# Patient Record
Sex: Female | Born: 1965 | Race: White | Hispanic: No | Marital: Married | Smoking: Never smoker
Health system: Southern US, Community
[De-identification: ages and names within clinical notes are randomized; demographics above are authoritative.]

## PROBLEM LIST (undated history)

## (undated) DIAGNOSIS — R569 Unspecified convulsions: Secondary | ICD-10-CM

## (undated) DIAGNOSIS — E039 Hypothyroidism, unspecified: Secondary | ICD-10-CM

## (undated) DIAGNOSIS — R011 Cardiac murmur, unspecified: Secondary | ICD-10-CM

## (undated) DIAGNOSIS — Z973 Presence of spectacles and contact lenses: Secondary | ICD-10-CM

## (undated) DIAGNOSIS — I10 Essential (primary) hypertension: Secondary | ICD-10-CM

## (undated) DIAGNOSIS — Z923 Personal history of irradiation: Secondary | ICD-10-CM

## (undated) DIAGNOSIS — R002 Palpitations: Secondary | ICD-10-CM

## (undated) DIAGNOSIS — Z8489 Family history of other specified conditions: Secondary | ICD-10-CM

## (undated) DIAGNOSIS — T753XXA Motion sickness, initial encounter: Secondary | ICD-10-CM

## (undated) DIAGNOSIS — K219 Gastro-esophageal reflux disease without esophagitis: Secondary | ICD-10-CM

## (undated) DIAGNOSIS — F419 Anxiety disorder, unspecified: Secondary | ICD-10-CM

## (undated) DIAGNOSIS — N6019 Diffuse cystic mastopathy of unspecified breast: Secondary | ICD-10-CM

## (undated) HISTORY — DX: Essential (primary) hypertension: I10

## (undated) HISTORY — DX: Gastro-esophageal reflux disease without esophagitis: K21.9

## (undated) HISTORY — DX: Anxiety disorder, unspecified: F41.9

## (undated) HISTORY — DX: Diffuse cystic mastopathy of unspecified breast: N60.19

## (undated) HISTORY — DX: Unspecified convulsions: R56.9

## (undated) HISTORY — DX: Palpitations: R00.2

---

## 2004-05-28 ENCOUNTER — Ambulatory Visit: Payer: Self-pay | Admitting: Unknown Physician Specialty

## 2004-11-01 ENCOUNTER — Ambulatory Visit: Payer: Self-pay | Admitting: Urology

## 2005-04-26 ENCOUNTER — Ambulatory Visit: Payer: Self-pay | Admitting: Urology

## 2005-07-16 ENCOUNTER — Ambulatory Visit: Payer: Self-pay | Admitting: Unknown Physician Specialty

## 2005-07-26 ENCOUNTER — Ambulatory Visit: Payer: Self-pay | Admitting: Unknown Physician Specialty

## 2006-10-02 ENCOUNTER — Ambulatory Visit: Payer: Self-pay | Admitting: Cardiology

## 2006-10-15 ENCOUNTER — Encounter: Payer: Self-pay | Admitting: Cardiology

## 2006-10-15 ENCOUNTER — Ambulatory Visit: Payer: Self-pay | Admitting: Cardiology

## 2006-11-26 ENCOUNTER — Ambulatory Visit: Payer: Self-pay | Admitting: Unknown Physician Specialty

## 2007-10-12 ENCOUNTER — Ambulatory Visit: Payer: Self-pay

## 2008-04-14 ENCOUNTER — Ambulatory Visit: Payer: Self-pay | Admitting: Internal Medicine

## 2008-04-21 ENCOUNTER — Ambulatory Visit: Payer: Self-pay | Admitting: Unknown Physician Specialty

## 2008-10-27 ENCOUNTER — Ambulatory Visit: Payer: Self-pay | Admitting: Internal Medicine

## 2009-04-24 ENCOUNTER — Ambulatory Visit: Payer: Self-pay | Admitting: Unknown Physician Specialty

## 2009-05-01 ENCOUNTER — Ambulatory Visit: Payer: Self-pay | Admitting: Internal Medicine

## 2010-04-26 ENCOUNTER — Ambulatory Visit: Payer: Self-pay | Admitting: Unknown Physician Specialty

## 2010-06-17 HISTORY — PX: LITHOTRIPSY: SUR834

## 2010-11-02 ENCOUNTER — Ambulatory Visit (INDEPENDENT_AMBULATORY_CARE_PROVIDER_SITE_OTHER): Payer: BC Managed Care – PPO | Admitting: Internal Medicine

## 2010-11-02 ENCOUNTER — Encounter: Payer: Self-pay | Admitting: Internal Medicine

## 2010-11-02 DIAGNOSIS — I10 Essential (primary) hypertension: Secondary | ICD-10-CM

## 2010-11-02 DIAGNOSIS — R319 Hematuria, unspecified: Secondary | ICD-10-CM

## 2010-11-02 DIAGNOSIS — Z0000 Encounter for general adult medical examination without abnormal findings: Secondary | ICD-10-CM

## 2010-11-02 DIAGNOSIS — N390 Urinary tract infection, site not specified: Secondary | ICD-10-CM

## 2010-11-02 LAB — POCT URINALYSIS DIPSTICK
Bilirubin, UA: NEGATIVE
Glucose, UA: NEGATIVE
Ketones, UA: NEGATIVE
Leukocytes, UA: NEGATIVE
Nitrite, UA: NEGATIVE

## 2010-11-02 NOTE — Assessment & Plan Note (Signed)
[facility] OFFICE NOTE   Nichole, Gordon                        MRN:          [id number]  DATE:10/02/2006                            DOB:          [birth year]    I was asked by Dr. Lenord Fellers to evaluate Nichole Gordon, a delightful, 45-  year-old, married white female, mother of 3, for a racing heart and  palpitations.   She notices this particularly when she is anxious. She has a lot of  stress ongoing in her life at present including building a new home  (though she has sold her other house), raising 3 children, 2 of which  are teenagers, and staying busy as a general homemaker.   This racing heart sensation happens when she is uptight. She says that  it never occurs when she is exercising or really relaxed.   She denies any chest tightness, pressure, shortness of breath, or any  other ischemic symptoms or other cardiac symptoms with exertion.   She denies any presyncope or syncope.   Recent blood work by Dr. Jonny Ruiz showed a normal CBC, normal thyroid,  normal comprehensive metabolic panel and her lipid status showed a total  cholesterol of 222, HDL of 61, triglycerides 132, LDL 135 with a total  HDL ratio of 3.6. Her EKG was also normal with a normal PR, QRS, and  QTC.   PAST MEDICAL HISTORY:  She is intolerant of CONTRAST DYE, she is  intolerant of AMOXICILLIN, TRILEPTAL, DEPAKOTE and DILANTIN.   CURRENT MEDICATIONS:  1. Triphasil birth control pill daily.  2. Toprol XL 50 mg a day which was started for the racing heart which      has helped.  3. Lexapro 10 mg a day.  4. Flexeril 10 mg p.r.n. for back spasms.  5. Klonopin 0.25 one p.r.n.   The last 3 medicines were started by Dr. Lenord Fellers which have helped since  her visit.   PAST SURGICAL HISTORY:  None.   FAMILY HISTORY:  Negative for premature coronary disease.   REVIEW OF SYSTEMS:  Other than the HPI is positive for chronic fatigue.   SOCIAL  HISTORY:  As above.   PHYSICAL EXAMINATION:  VITAL SIGNS:  Blood pressure is 128/84, pulse is  84 and regular. She is 5 foot 4, weight is 159.  HEENT:  Slightly ruddy complexion. She is a little bit anxious. PERRLA.  Extraocular movements intact. Sclera clear. Facial symmetry is normal.  Dentition satisfactory. Carotids are full without bruits. There is no  thyromegaly. There is no JVD.  LUNGS:  Clear.  HEART:  Reveals a nondisplaced PMI. She has normal S1 and S2 without  murmurs, rubs or gallops.  ABDOMEN:  Soft with good bowel sounds. No midline bruit. There is no  hepatosplenomegaly.  EXTREMITIES:  No clubbing, cyanosis or edema. Pulses are brisk.  NEUROLOGIC:  Intact.  SKIN:  Intact.   ASSESSMENT:  1. Tachypalpitations which most likely are stress-induced sinus      tachycardia.  2. Anxiety.   PLAN:  2-D echo to rule out any structural heart  disease. If this is  normal, reassurance has been given.   I recommended therapeutic lifestyle changes for stress reduction  including regular exercise, regular sleep, eating properly, avoiding  caffeinated beverages or stimulants and making sure she has her punch  list done before she moves into her new house. It was a pleasure  meeting her.     Thomas C. Daleen Squibb, MD, [facility]  Electronically Signed    TCW/MedQ  DD: 10/02/2006  DT: 10/02/2006  Job #: [id number]   cc:   Nichole Gordon. Lenord Fellers, M.D.

## 2010-11-02 NOTE — Progress Notes (Signed)
  Subjective:    Patient ID: Nichole Gordon, female    DOB: [birth year], 45 y.o.   MRN: [id number]  HPI 45 year old W female for CPE and evaluation of intermittent LLQ abdominal pain since November. Saw Dr. Evelene Croon at [facility] urological in November and was dx with a kidney stone. Did not have CT then. Hx multiple kidney stones. Describes pain as burning and pinching lasting sometimes all day. Saw GYN early May and was told exam was OK. Then saw urologist and said no evidence of stone based on KUB and to revisit GYN. Pt has moderate non hemolyzed occult blood on dipstick today. Sent urine for microscopic and culture. Palpitations are stable on beta blocker. Had mammogram in November. Hx of fibrocystic breast disease. Hx of anxiety and occasionally takes Klonopin.    Review of Systems  Constitutional: Fatigue: Some fatigue.  Cardiovascular: Negative for chest pain and palpitations.  Genitourinary: Positive for hematuria (Saw blood in urine last week in April on one occasion) and flank pain. Negative for dysuria, vaginal bleeding, vaginal discharge, difficulty urinating and vaginal pain.  Psychiatric/Behavioral: Negative for behavioral problems, confusion and agitation. The patient is not nervous/anxious.        Objective:   Physical Exam  Constitutional: She is oriented to person, place, and time. She appears well-nourished. No distress.  HENT:  Head: Normocephalic and atraumatic.  Nose: Nose normal.  Mouth/Throat: Oropharynx is clear and moist.  Eyes: Conjunctivae and EOM are normal. Pupils are equal, round, and reactive to light. No scleral icterus.  Neck: Neck supple. No JVD present. No thyromegaly present.  Cardiovascular: Normal rate, regular rhythm and normal heart sounds.   No murmur heard. Pulmonary/Chest: Effort normal and breath sounds normal. She has no wheezes. She has no rales.  Abdominal: Soft. Bowel sounds are normal. She exhibits no distension and no mass. There is no tenderness.  There is no rebound and no guarding.  Genitourinary: Uterus normal.       No masses or tenderness on bimanual exam   Musculoskeletal: Normal range of motion. She exhibits no edema.  Lymphadenopathy:    She has no cervical adenopathy.  Neurological: She is alert and oriented to person, place, and time.  Skin: Skin is warm and dry. No rash noted.          Assessment & Plan:  Left lower Quadrant Abdominal pain with hx kidney stones. No recent CT done and pain is persistent. Schedule CT of pelvis/kidney Palpitations-stable on current regimen Anxiety-stable RTC after CT done

## 2010-11-03 LAB — CBC WITH DIFFERENTIAL/PLATELET
Basophils Absolute: 0 10*3/uL (ref 0.0–0.1)
Basophils Relative: 0 % (ref 0–1)
Eosinophils Absolute: 0.1 10*3/uL (ref 0.0–0.7)
Eosinophils Relative: 1 % (ref 0–5)
Lymphocytes Relative: 31 % (ref 12–46)
MCHC: 32.1 g/dL (ref 30.0–36.0)
MCV: 92.7 fL (ref 78.0–100.0)
Monocytes Absolute: 0.5 10*3/uL (ref 0.1–1.0)
Platelets: 263 10*3/uL (ref 150–400)
RDW: 12.9 % (ref 11.5–15.5)
WBC: 6.9 10*3/uL (ref 4.0–10.5)

## 2010-11-03 LAB — URINALYSIS, ROUTINE W REFLEX MICROSCOPIC
Bilirubin Urine: NEGATIVE
Ketones, ur: NEGATIVE mg/dL
Nitrite: NEGATIVE
Protein, ur: NEGATIVE mg/dL
Urobilinogen, UA: 1 mg/dL (ref 0.0–1.0)

## 2010-11-03 LAB — TSH: TSH: 0.339 u[IU]/mL — ABNORMAL LOW (ref 0.350–4.500)

## 2010-11-03 LAB — COMPREHENSIVE METABOLIC PANEL
ALT: 18 U/L (ref 0–35)
AST: 29 U/L (ref 0–37)
Alkaline Phosphatase: 85 U/L (ref 39–117)
BUN: 10 mg/dL (ref 6–23)
Chloride: 104 mEq/L (ref 96–112)
Creat: 0.5 mg/dL (ref 0.40–1.20)
Total Bilirubin: 0.6 mg/dL (ref 0.3–1.2)

## 2010-11-03 LAB — LIPID PANEL
HDL: 60 mg/dL (ref >39)
LDL Cholesterol: 127 mg/dL — ABNORMAL HIGH (ref 0–99)
Total CHOL/HDL Ratio: 3.4 Ratio
VLDL: 18 mg/dL (ref 0–40)

## 2010-11-03 LAB — VITAMIN D 25 HYDROXY (VIT D DEFICIENCY, FRACTURES): Vit D, 25-Hydroxy: 88 ng/mL (ref 30–89)

## 2010-11-05 ENCOUNTER — Ambulatory Visit
Admission: RE | Admit: 2010-11-05 | Discharge: 2010-11-05 | Disposition: A | Discharge status: Home / Self Care | Payer: BC Managed Care – PPO | Source: Ambulatory Visit | Attending: Internal Medicine | Admitting: Internal Medicine

## 2010-11-05 DIAGNOSIS — R319 Hematuria, unspecified: Secondary | ICD-10-CM

## 2010-11-06 LAB — URINE CULTURE: Colony Count: >=100000

## 2010-11-06 MED ORDER — LEVOFLOXACIN 500 MG PO TABS: 500.0000 mg | ORAL_TABLET | Freq: Every day | ORAL | Status: AC

## 2010-11-06 NOTE — Progress Notes (Signed)
Addended by: Chelsea Aus on: 11/06/2010 11:36 AM   Modules accepted: Orders

## 2010-11-09 ENCOUNTER — Encounter: Payer: Self-pay | Admitting: Internal Medicine

## 2010-11-09 ENCOUNTER — Ambulatory Visit (INDEPENDENT_AMBULATORY_CARE_PROVIDER_SITE_OTHER): Payer: BC Managed Care – PPO | Admitting: Internal Medicine

## 2010-11-09 DIAGNOSIS — F419 Anxiety disorder, unspecified: Secondary | ICD-10-CM

## 2010-11-09 DIAGNOSIS — F411 Generalized anxiety disorder: Secondary | ICD-10-CM

## 2010-11-09 DIAGNOSIS — N200 Calculus of kidney: Secondary | ICD-10-CM | POA: Insufficient documentation

## 2010-11-09 MED ORDER — CLONAZEPAM 0.5 MG PO TABS: 0.5000 mg | ORAL_TABLET | Freq: Two times a day (BID) | ORAL | Status: DC

## 2010-11-09 NOTE — Progress Notes (Signed)
  Subjective:    Patient ID: Nichole Gordon, female    DOB: [birth year], 45 y.o.   MRN: [id number]  HPI  For follow up of pelvic pain. Was found to have 2 organism UTI with E.coli and Enterococcus despite a normal U/A.  LLQ abd pain has improved. CT showed 2 stones in left kidney nonobstructing. Both organisms sensitive to Levaquin which pt should finish 10 day course. Labs reviewed Has LDL of 127. Rec diet and exercise.    Review of Systems     Objective:   Physical Exam No back pain or Abd pain       Assessment & Plan:  1- UTI 2-Kidney stones on left nonobstructing 3-Hyperlipidemia 4-Anxiety

## 2010-11-09 NOTE — Patient Instructions (Signed)
Finish Levaquin. Call if sxs return See in one year.

## 2010-11-21 ENCOUNTER — Other Ambulatory Visit: Payer: Self-pay | Admitting: Internal Medicine

## 2010-11-26 ENCOUNTER — Ambulatory Visit (INDEPENDENT_AMBULATORY_CARE_PROVIDER_SITE_OTHER): Payer: BC Managed Care – PPO | Admitting: Internal Medicine

## 2010-11-26 ENCOUNTER — Encounter: Payer: Self-pay | Admitting: Internal Medicine

## 2010-11-26 VITALS — Temp 98.7°F

## 2010-11-26 DIAGNOSIS — N390 Urinary tract infection, site not specified: Secondary | ICD-10-CM

## 2010-11-26 DIAGNOSIS — R300 Dysuria: Secondary | ICD-10-CM

## 2010-11-26 LAB — POCT URINALYSIS DIPSTICK
Ketones, UA: NEGATIVE
Protein, UA: NEGATIVE
Spec Grav, UA: 1.015
pH, UA: 7

## 2010-11-26 NOTE — Progress Notes (Signed)
  Subjective:    Patient ID: Nichole Gordon, female    DOB: [birth year], 45 y.o.   MRN: [id number]  HPI in today with recurrent complaint of left lower quadrant pain with some dysuria. At last visit was found to have 2 organism urinary tract infection with Escherichia coli and enterococci. Was treated with Levaquin 500 mg daily for 10 days. Symptoms improved at that time. Patient had CT to rule out kidney stone obstructing ureter and was found to have kidney stones in the kidney that were not obstructing. Today UA obtained along with culture. No nausea vomiting fever or chills    Review of Systems     Objective:   Physical Exam no CVA tenderness, no rebound tenderness and left lower quadrant.        Assessment & Plan:  Recurrent left lower quadrant pain-? UTI versus kidney stone pain. However patient has no hematuria. Recent CT unremarkable except for kidney stones in left kidney.   Plan is to treat her with Levaquin 500 mg daily for 7 days and have her return here in one week. If she has recurrent problems, she may need to revisit urologist.

## 2010-11-26 NOTE — Patient Instructions (Signed)
Take Levaquin 500 mg daily with food for 7 days. Return in one week.

## 2010-11-26 NOTE — Progress Notes (Signed)
Addended by: Daisy Blossom on: 11/26/2010 03:38 PM   Modules accepted: Level of Service

## 2010-11-28 LAB — URINE CULTURE: Colony Count: 40000

## 2010-12-04 ENCOUNTER — Ambulatory Visit (INDEPENDENT_AMBULATORY_CARE_PROVIDER_SITE_OTHER): Payer: BC Managed Care – PPO | Admitting: Internal Medicine

## 2010-12-04 DIAGNOSIS — N390 Urinary tract infection, site not specified: Secondary | ICD-10-CM

## 2010-12-04 LAB — POCT URINALYSIS DIPSTICK
Bilirubin, UA: NEGATIVE
Ketones, UA: NEGATIVE
Leukocytes, UA: NEGATIVE
pH, UA: 5

## 2010-12-04 NOTE — Progress Notes (Signed)
Pt came up for follow-up on UTI.  Urine dip WNL and pt reports no more signs or symptoms.  Instructed pt to call office with any other needs or concerns.

## 2010-12-17 ENCOUNTER — Other Ambulatory Visit: Payer: Self-pay | Admitting: Internal Medicine

## 2011-01-23 ENCOUNTER — Ambulatory Visit: Payer: Self-pay | Admitting: Urology

## 2011-01-24 ENCOUNTER — Ambulatory Visit: Payer: Self-pay | Admitting: Urology

## 2011-05-21 ENCOUNTER — Ambulatory Visit: Payer: Self-pay | Admitting: Unknown Physician Specialty

## 2011-09-23 ENCOUNTER — Other Ambulatory Visit: Payer: Self-pay

## 2011-09-23 MED ORDER — ESCITALOPRAM OXALATE 10 MG PO TABS: 10.0000 mg | ORAL_TABLET | Freq: Every day | ORAL | Status: DC

## 2011-11-07 ENCOUNTER — Other Ambulatory Visit: Payer: BC Managed Care – PPO | Admitting: Internal Medicine

## 2011-11-08 ENCOUNTER — Encounter: Payer: BC Managed Care – PPO | Admitting: Internal Medicine

## 2011-11-12 ENCOUNTER — Other Ambulatory Visit: Payer: BC Managed Care – PPO | Admitting: Internal Medicine

## 2011-11-12 DIAGNOSIS — Z0000 Encounter for general adult medical examination without abnormal findings: Secondary | ICD-10-CM

## 2011-11-12 LAB — LIPID PANEL
HDL: 49 mg/dL (ref >39)
Triglycerides: 76 mg/dL (ref <150)

## 2011-11-12 LAB — CBC WITH DIFFERENTIAL/PLATELET
HCT: 40.2 % (ref 36.0–46.0)
Hemoglobin: 13.4 g/dL (ref 12.0–15.0)
Lymphocytes Relative: 35 % (ref 12–46)
Lymphs Abs: 2.5 10*3/uL (ref 0.7–4.0)
Monocytes Absolute: 0.4 10*3/uL (ref 0.1–1.0)
Monocytes Relative: 6 % (ref 3–12)
Neutro Abs: 4 10*3/uL (ref 1.7–7.7)
WBC: 7 10*3/uL (ref 4.0–10.5)

## 2011-11-12 LAB — COMPREHENSIVE METABOLIC PANEL
Albumin: 4.6 g/dL (ref 3.5–5.2)
BUN: 11 mg/dL (ref 6–23)
Calcium: 9.3 mg/dL (ref 8.4–10.5)
Chloride: 105 mEq/L (ref 96–112)
Glucose, Bld: 88 mg/dL (ref 70–99)
Potassium: 4.5 mEq/L (ref 3.5–5.3)

## 2011-11-12 LAB — TSH: TSH: 0.059 u[IU]/mL — ABNORMAL LOW (ref 0.350–4.500)

## 2011-11-14 ENCOUNTER — Ambulatory Visit (INDEPENDENT_AMBULATORY_CARE_PROVIDER_SITE_OTHER): Payer: BC Managed Care – PPO | Admitting: Internal Medicine

## 2011-11-14 ENCOUNTER — Encounter: Payer: Self-pay | Admitting: Internal Medicine

## 2011-11-14 VITALS — BP 116/78 | HR 80 | Temp 99.1°F | Ht 63.75 in | Wt 150.0 lb

## 2011-11-14 DIAGNOSIS — F411 Generalized anxiety disorder: Secondary | ICD-10-CM

## 2011-11-14 DIAGNOSIS — I10 Essential (primary) hypertension: Secondary | ICD-10-CM

## 2011-11-14 DIAGNOSIS — N6019 Diffuse cystic mastopathy of unspecified breast: Secondary | ICD-10-CM

## 2011-11-14 DIAGNOSIS — F419 Anxiety disorder, unspecified: Secondary | ICD-10-CM

## 2011-11-14 DIAGNOSIS — Z0000 Encounter for general adult medical examination without abnormal findings: Secondary | ICD-10-CM

## 2011-11-14 DIAGNOSIS — R7989 Other specified abnormal findings of blood chemistry: Secondary | ICD-10-CM

## 2011-11-14 DIAGNOSIS — Z87442 Personal history of urinary calculi: Secondary | ICD-10-CM

## 2011-11-14 LAB — POCT URINALYSIS DIPSTICK
Glucose, UA: NEGATIVE
Ketones, UA: NEGATIVE
Leukocytes, UA: NEGATIVE
Spec Grav, UA: 1.025
Urobilinogen, UA: NEGATIVE

## 2011-11-17 NOTE — Patient Instructions (Addendum)
Continue Toprol for hypertension Lexapro and occasional Klonopin. Consider starting Synthroid for subnormal TSH

## 2011-11-19 ENCOUNTER — Other Ambulatory Visit: Payer: BC Managed Care – PPO | Admitting: Internal Medicine

## 2011-11-21 ENCOUNTER — Encounter: Payer: BC Managed Care – PPO | Admitting: Internal Medicine

## 2011-12-15 ENCOUNTER — Encounter: Payer: Self-pay | Admitting: Internal Medicine

## 2011-12-15 DIAGNOSIS — F419 Anxiety disorder, unspecified: Secondary | ICD-10-CM | POA: Insufficient documentation

## 2011-12-15 DIAGNOSIS — I10 Essential (primary) hypertension: Secondary | ICD-10-CM | POA: Insufficient documentation

## 2011-12-15 DIAGNOSIS — N6019 Diffuse cystic mastopathy of unspecified breast: Secondary | ICD-10-CM | POA: Insufficient documentation

## 2011-12-15 DIAGNOSIS — R7989 Other specified abnormal findings of blood chemistry: Secondary | ICD-10-CM | POA: Insufficient documentation

## 2011-12-15 NOTE — Progress Notes (Signed)
  Subjective:    Patient ID: Nichole Gordon, female    DOB: Mar 06, 1966, 46 y.o.   MRN: [id number]  HPI 46 year old white female with history of hypertension and kidney stones in today for health maintenance and evaluation of medical problems. History of anxiety for which she takes Lexapro. Was placed on that in 2008. Occasionally takes Klonopin for anxiety. Is on Toprol-XL 50 mg daily for hypertension and blood pressures under good control. History of mild hyperlipidemia and fibrocystic breast disease. Patient says she had lithotripsy for left kidney stones August 2012. Also in 2012 had 2 organism urinary tract infection. Urologist is in [place] where she resides.    Recent lab work shows an LDL cholesterol of 120, total cholesterol 184, HDL cholesterol 49, triglycerides 76. Vitamin D level is good at 83. CBC is within normal limits.  Of note over the past year she has had 2 TSH values that are subnormal.  Patient had tetanus immunization May 2010.  Family history: Mother with history of uterine cancer, patient has a son with high functioning call to his him, one brother with hypertension, father with history of stroke and alcoholism. Mother with history of mental issues and hypertension. One sister in good health.  Patient is married has 2 sons and a daughter. 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 Does not smoke or consume alcohol. Does try to exercise 3 times weekly.      Review of Systems  Constitutional: Negative.   All other systems reviewed and are negative.       Objective:   Physical Exam  Nursing note and vitals reviewed. Constitutional: She is oriented to person, place, and time. She appears well-developed and well-nourished. No distress.  HENT:  Head: Normocephalic and atraumatic.  Right Ear: External ear normal.  Left Ear: External ear normal.  Mouth/Throat: Oropharynx is clear and moist.  Eyes: Conjunctivae and EOM are normal. Pupils are equal, round, and  reactive to light. Right eye exhibits no discharge. Left eye exhibits no discharge. No scleral icterus.  Neck: Neck supple. No JVD present. No thyromegaly present.  Cardiovascular: Normal rate, regular rhythm, normal heart sounds and intact distal pulses.   No murmur heard. Pulmonary/Chest: Effort normal and breath sounds normal. She has no wheezes. She has no rales.       Breast normal female  Abdominal: Soft. Bowel sounds are normal.  Genitourinary:       Deferred  Musculoskeletal: She exhibits no edema.  Lymphadenopathy:    She has no cervical adenopathy.  Neurological: She is alert and oriented to person, place, and time. She has normal reflexes. She displays normal reflexes. No cranial nerve deficit. She exhibits normal muscle tone. Coordination normal.  Skin: Skin is warm and dry. No rash noted. She is not diaphoretic.  Psychiatric: She has a normal mood and affect. Her behavior is normal. Judgment and thought content normal.          Assessment & Plan:  Anxiety-treated with occasional Klonopin and Lexapro on a daily basis  Hyperlipidemia with elevated LDL-continue diet and exercise  Fibrocystic breast disease  Hypertension-well-controlled on Toprol  History of kidney stones  Subnormal TSH-consider starting Synthroid 0.025 mg daily with followup in 3 months.  Health maintenance: Patient needs annual mammogram.

## 2012-01-17 ENCOUNTER — Other Ambulatory Visit: Payer: Self-pay | Admitting: Internal Medicine

## 2012-02-17 ENCOUNTER — Telehealth: Payer: Self-pay | Admitting: Internal Medicine

## 2012-02-17 NOTE — Telephone Encounter (Signed)
Attended clear 01 patient is on thyroid replacement therapy. She has had 2 TSH results in the past year that her low. Patient is being treated for presumed hypothyroidism by GYN physician. I do not have any notes from GYN physician. Apparently is on Synthroid 0.025 mg daily but recent TSH was 0.0591 year ago was 0.339. We'll records indicate patient had normal TSH in 2002. Had normal cranial MR in 2001. This was done because of question of seizure disorder. Later was thought to be anxiety related. Records indicate that in 2008 at [facility] the bowel or cardiology review of labs showed a normal thyroid test by Dr. Jonny Ruiz. TSH was normal here in 2009 in 2010. We need to note that she is on thyroid replacement therapy per GYN although it would seem that she might be over replaced

## 2012-05-27 ENCOUNTER — Ambulatory Visit: Payer: Self-pay | Admitting: Nurse Practitioner

## 2012-11-02 LAB — HM PAP SMEAR: HM Pap smear: NEGATIVE

## 2013-01-26 ENCOUNTER — Other Ambulatory Visit: Payer: Self-pay | Admitting: Internal Medicine

## 2013-01-28 ENCOUNTER — Encounter: Payer: BC Managed Care – PPO | Admitting: Internal Medicine

## 2013-02-12 ENCOUNTER — Other Ambulatory Visit: Payer: Self-pay | Admitting: Internal Medicine

## 2013-02-12 ENCOUNTER — Other Ambulatory Visit: Payer: Self-pay

## 2013-02-23 ENCOUNTER — Other Ambulatory Visit: Payer: BC Managed Care – PPO | Admitting: Internal Medicine

## 2013-02-23 DIAGNOSIS — Z13220 Encounter for screening for lipoid disorders: Secondary | ICD-10-CM

## 2013-02-23 DIAGNOSIS — I10 Essential (primary) hypertension: Secondary | ICD-10-CM

## 2013-02-23 DIAGNOSIS — Z130 Encounter for screening for diseases of the blood and blood-forming organs and certain disorders involving the immune mechanism: Secondary | ICD-10-CM

## 2013-02-23 DIAGNOSIS — E039 Hypothyroidism, unspecified: Secondary | ICD-10-CM

## 2013-02-23 LAB — COMPREHENSIVE METABOLIC PANEL
ALT: 10 U/L (ref 0–35)
AST: 18 U/L (ref 0–37)
Alkaline Phosphatase: 72 U/L (ref 39–117)
Sodium: 136 mEq/L (ref 135–145)
Total Bilirubin: 0.7 mg/dL (ref 0.3–1.2)
Total Protein: 6.6 g/dL (ref 6.0–8.3)

## 2013-02-23 LAB — LIPID PANEL
LDL Cholesterol: 113 mg/dL — ABNORMAL HIGH (ref 0–99)
VLDL: 19 mg/dL (ref 0–40)

## 2013-02-23 LAB — CBC WITH DIFFERENTIAL/PLATELET
Basophils Relative: 0 % (ref 0–1)
Eosinophils Absolute: 0.1 10*3/uL (ref 0.0–0.7)
Lymphs Abs: 2.6 10*3/uL (ref 0.7–4.0)
MCH: 30.2 pg (ref 26.0–34.0)
MCHC: 34.1 g/dL (ref 30.0–36.0)
Neutrophils Relative %: 58 % (ref 43–77)
Platelets: 236 10*3/uL (ref 150–400)
RBC: 4.67 MIL/uL (ref 3.87–5.11)

## 2013-02-24 LAB — VITAMIN D 25 HYDROXY (VIT D DEFICIENCY, FRACTURES): Vit D, 25-Hydroxy: 67 ng/mL (ref 30–89)

## 2013-02-26 ENCOUNTER — Other Ambulatory Visit: Payer: Self-pay | Admitting: Internal Medicine

## 2013-03-01 ENCOUNTER — Encounter: Payer: Self-pay | Admitting: Internal Medicine

## 2013-03-01 ENCOUNTER — Ambulatory Visit (INDEPENDENT_AMBULATORY_CARE_PROVIDER_SITE_OTHER): Payer: BC Managed Care – PPO | Admitting: Internal Medicine

## 2013-03-01 VITALS — BP 136/84 | HR 88 | Temp 99.1°F | Ht 63.25 in | Wt 145.5 lb

## 2013-03-01 DIAGNOSIS — Z0000 Encounter for general adult medical examination without abnormal findings: Secondary | ICD-10-CM

## 2013-03-01 DIAGNOSIS — I10 Essential (primary) hypertension: Secondary | ICD-10-CM

## 2013-03-01 DIAGNOSIS — E7800 Pure hypercholesterolemia, unspecified: Secondary | ICD-10-CM

## 2013-03-01 DIAGNOSIS — F411 Generalized anxiety disorder: Secondary | ICD-10-CM

## 2013-03-01 DIAGNOSIS — R6889 Other general symptoms and signs: Secondary | ICD-10-CM

## 2013-03-01 DIAGNOSIS — R7989 Other specified abnormal findings of blood chemistry: Secondary | ICD-10-CM

## 2013-03-01 LAB — POCT URINALYSIS DIPSTICK
Leukocytes, UA: NEGATIVE
Nitrite, UA: NEGATIVE
Protein, UA: NEGATIVE
pH, UA: 6.5

## 2013-03-02 ENCOUNTER — Other Ambulatory Visit: Payer: Self-pay | Admitting: Internal Medicine

## 2013-03-23 ENCOUNTER — Other Ambulatory Visit: Payer: Self-pay | Admitting: Internal Medicine

## 2013-03-26 NOTE — Progress Notes (Signed)
  Subjective:    Patient ID: Nichole Gordon, female    DOB: [birth year], 47 y.o.   MRN: [id number]  HPI 47 year old White female with history of hypertension and kidney stones in today for health maintenance and evaluation of medical issues. History of anxiety for which she takes Lexapro and occasionally takes Klonopin. She was placed on medication for anxiety in 2008. Is on Toprol-XL 50 mg daily for hypertension. Blood pressure has been under good control. History of mild hyperlipidemia. History of fibrocystic breast disease. Patient says she had lithotripsy for left kidney stones August 2012. Also at that time she had a to organism urinary tract infection. Urologist is in [place] where she resides.  History of to TSH values that have been some normal in the past.  Tetanus immunization in May 2010.  Family history: Mother with history of uterine cancer. Patient has a son with high functioning autism. One brother with hypertension. Father with history of stroke and alcoholism. Mother with history of mental illness and hypertension. One sister in good health.  Social history: Patient is married, has 2 sons and a daughter. 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 Does not smoke or consume alcohol. Does try to exercise several times weekly.    Review of Systems  Constitutional: Negative.   All other systems reviewed and are negative.       Objective:   Physical Exam  Vitals reviewed. Constitutional: She is oriented to person, place, and time. She appears well-developed and well-nourished. No distress.  HENT:  Head: Normocephalic and atraumatic.  Left Ear: External ear normal.  Mouth/Throat: Oropharynx is clear and moist. No oropharyngeal exudate.  Eyes: Conjunctivae and EOM are normal. Pupils are equal, round, and reactive to light. Right eye exhibits no discharge. Left eye exhibits no discharge. No scleral icterus.  Neck: Neck supple. No JVD present. No thyromegaly present.   Cardiovascular: Normal rate, regular rhythm and normal heart sounds.   No murmur heard. Pulmonary/Chest: Effort normal and breath sounds normal. No respiratory distress. She has no rales. She exhibits no tenderness.  Breasts normal female  Abdominal: Soft. Bowel sounds are normal. She exhibits no distension and no mass. There is no tenderness. There is no rebound and no guarding.  Genitourinary:  deferred  Musculoskeletal: She exhibits no edema.  Lymphadenopathy:    She has no cervical adenopathy.  Neurological: She is alert and oriented to person, place, and time. Coordination normal.  Skin: Skin is warm and dry. No rash noted. She is not diaphoretic.  Psychiatric: She has a normal mood and affect. Her behavior is normal. Judgment and thought content normal.          Assessment & Plan:  Hypertension-well-controlled on Toprol 25 mg daily  Hyperlipidemia-history of elevated LDL continue diet and exercise  Anxiety treated with occasional Klonopin and daily Lexapro.  History of fibrocystic breast disease  History of kidney stones  History of low TSH. She is being treated by gynecologist with Synthroid and also Cytomel. Have asked her to speak with her gynecologist about persistently low TSH. It seems that she is over replaced with thyroid replacement  and may not need it at all.  Add: 10/10 14 Colonoscopy done  at [facility] 02/28/13 showed hyperplastic polys and sessile polyp. They recommend repeat study in 5 years.

## 2013-07-30 ENCOUNTER — Ambulatory Visit: Payer: Self-pay | Admitting: Nurse Practitioner

## 2013-08-01 NOTE — Patient Instructions (Signed)
Ask her to speak with gynecologist about persistently abnormal TSH on thyroid replacement therapy. Return in one year. Watch diet and exercise.

## 2014-02-18 ENCOUNTER — Encounter: Payer: Self-pay | Admitting: Podiatry

## 2014-02-18 ENCOUNTER — Ambulatory Visit (INDEPENDENT_AMBULATORY_CARE_PROVIDER_SITE_OTHER): Payer: BC Managed Care – PPO | Admitting: Podiatry

## 2014-02-18 ENCOUNTER — Ambulatory Visit (INDEPENDENT_AMBULATORY_CARE_PROVIDER_SITE_OTHER): Payer: BC Managed Care – PPO

## 2014-02-18 VITALS — Ht 63.5 in | Wt 147.0 lb

## 2014-02-18 DIAGNOSIS — G5762 Lesion of plantar nerve, left lower limb: Secondary | ICD-10-CM

## 2014-02-18 DIAGNOSIS — M7750 Other enthesopathy of unspecified foot: Secondary | ICD-10-CM

## 2014-02-18 DIAGNOSIS — M722 Plantar fascial fibromatosis: Secondary | ICD-10-CM

## 2014-02-18 DIAGNOSIS — G5760 Lesion of plantar nerve, unspecified lower limb: Secondary | ICD-10-CM

## 2014-02-18 MED ORDER — TRIAMCINOLONE ACETONIDE 10 MG/ML IJ SUSP: 10.0000 mg | Freq: Once | INTRAMUSCULAR | Status: DC

## 2014-02-18 NOTE — Progress Notes (Signed)
Subjective:     Patient ID: Nichole Gordon, female   DOB: 22-Aug-1965, 48 y.o.   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 Injected the base of the fifth metatarsal 3 mg Kenalog 5 mg Xylocaine and discussed surgical intervention for the neuroma given his long-term history and failure to respond to conservative care. Reappoint to recheck

## 2014-02-18 NOTE — Progress Notes (Signed)
   Subjective:    Patient ID: Nichole Gordon, female    DOB: 1966-02-08, 48 y.o.   MRN: [id number]  HPI Comments: 2 weeks ago stepping off a curb i heard a pop in the foot, the foot was fine at the time , but it seems to have got worse , left foot lateral side of foot   Foot Pain      Review of Systems  All other systems reviewed and are negative.      Objective:   Physical Exam        Assessment & Plan:

## 2014-04-15 ENCOUNTER — Other Ambulatory Visit: Payer: Self-pay | Admitting: Internal Medicine

## 2014-10-03 ENCOUNTER — Ambulatory Visit: Admit: 2014-10-03 | Disposition: A | Discharge status: Home / Self Care | Payer: Self-pay | Admitting: Family Medicine

## 2014-10-06 ENCOUNTER — Ambulatory Visit
Admit: 2014-10-06 | Disposition: A | Discharge status: Home / Self Care | Payer: Self-pay | Attending: Family Medicine | Admitting: Family Medicine

## 2014-10-06 LAB — HM MAMMOGRAPHY

## 2014-11-16 ENCOUNTER — Ambulatory Visit: Payer: BLUE CROSS/BLUE SHIELD | Admitting: Family Medicine

## 2014-11-18 ENCOUNTER — Other Ambulatory Visit: Payer: Self-pay | Admitting: Family Medicine

## 2014-11-18 DIAGNOSIS — I10 Essential (primary) hypertension: Secondary | ICD-10-CM

## 2014-11-18 DIAGNOSIS — E039 Hypothyroidism, unspecified: Secondary | ICD-10-CM

## 2014-11-21 ENCOUNTER — Other Ambulatory Visit: Payer: Self-pay

## 2014-11-21 ENCOUNTER — Other Ambulatory Visit: Payer: Self-pay | Admitting: Family Medicine

## 2014-11-21 ENCOUNTER — Ambulatory Visit (INDEPENDENT_AMBULATORY_CARE_PROVIDER_SITE_OTHER): Payer: BLUE CROSS/BLUE SHIELD | Admitting: Family Medicine

## 2014-11-21 ENCOUNTER — Encounter (INDEPENDENT_AMBULATORY_CARE_PROVIDER_SITE_OTHER): Payer: Self-pay

## 2014-11-21 ENCOUNTER — Encounter: Payer: Self-pay | Admitting: Family Medicine

## 2014-11-21 VITALS — BP 134/82 | HR 83 | Temp 98.6°F | Ht 64.5 in | Wt 160.6 lb

## 2014-11-21 DIAGNOSIS — E039 Hypothyroidism, unspecified: Secondary | ICD-10-CM

## 2014-11-21 DIAGNOSIS — I10 Essential (primary) hypertension: Secondary | ICD-10-CM | POA: Diagnosis not present

## 2014-11-21 DIAGNOSIS — F419 Anxiety disorder, unspecified: Secondary | ICD-10-CM | POA: Diagnosis not present

## 2014-11-21 DIAGNOSIS — Z0000 Encounter for general adult medical examination without abnormal findings: Secondary | ICD-10-CM

## 2014-11-21 MED ORDER — ESCITALOPRAM OXALATE 20 MG PO TABS: 20.0000 mg | ORAL_TABLET | Freq: Every day | ORAL | Status: DC

## 2014-11-21 NOTE — Progress Notes (Signed)
BP 134/82 mmHg  Pulse 83  Temp(Src) 98.6 F (37 C)  Ht 5' 4.5" (1.638 m)  Wt 160 lb 9.6 oz (72.848 kg)  BMI 27.15 kg/m2  SpO2 98%  LMP 11/12/2014 (Exact Date)   Subjective:    Patient ID: Nichole Gordon, female    DOB: 1966-01-02, 49 y.o.   MRN: [id number]  HPI: Nichole Gordon is a 49 y.o. female presenting on 11/21/2014 for Hypothyroidism and Anxiety Nichole Gordon is doing well. She is anxious as her daughter is going off to college and she is very anxious about it, so Nichole Gordon is worried about her. She is otherwise feeling well with no other concerns or complaints at this time.   ANXIETY/STRESS Duration:stable Anxious mood: yes  Excessive worrying: no Irritability: no  Sweating: no Nausea: no Palpitations:no Hyperventilation: no Panic attacks: no Agoraphobia: no  Obscessions/compulsions: no Depressed mood: no Anhedonia: no Weight changes: yes Insomnia: no   Hypersomnia: no Fatigue/loss of energy: no Feelings of worthlessness: no Feelings of guilt: no Impaired concentration/indecisiveness: no Suicidal ideations: no  Crying spells: no Recent Stressors/Life Changes: yes   Relationship problems: no   Family stress: yes     Financial stress: no    Job stress: no    Recent death/loss: no  HYPERTENSION Hypertension status: controlled Satisfied with current treatment? yes Duration of hypertension: chronic BP monitoring frequency:  rarely Medication compliance: Not currently on anything Aspirin: no Recurrent headaches: no Visual changes: no Palpitations: no Dyspnea: no Chest pain: no Lower extremity edema: no Dizzy/lightheaded: no   HYPOTHYROIDISM Thyroid control status:controlled Satisfied with current treatment? yes Medication side effects: no Medication compliance: excellent compliance Recent dose adjustment:no Fatigue: no Cold intolerance: no Heat intolerance: no Weight gain: yes Weight loss: no Constipation: no Diarrhea/loose stools:  no Palpitations: no Lower extremity edema: no Anxiety/depressed mood: yes  Relevant past medical, surgical, family and social history reviewed and updated as indicated. Interim medical history since our last visit reviewed. Allergies and medications reviewed and updated.  Current Outpatient Prescriptions on File Prior to Visit  Medication Sig  . Cholecalciferol (VITAMIN D) 2000 UNITS CAPS Take by mouth.    . DYMISTA 137-50 MCG/ACT SUSP   . escitalopram (LEXAPRO) 10 MG tablet TAKE 1 TABLET EVERY DAY (Patient not taking: Reported on 11/21/2014)  . escitalopram (LEXAPRO) 10 MG tablet TAKE 1 TABLET EVERY DAY (Patient not taking: Reported on 11/21/2014)  . levothyroxine (SYNTHROID, LEVOTHROID) 25 MCG tablet Take 25 mcg by mouth daily.  Marland Kitchen liothyronine (CYTOMEL) 5 MCG tablet   . Multiple Vitamin (MULTIVITAMIN) capsule Take 1 capsule by mouth daily.     Current Facility-Administered Medications on File Prior to Visit  Medication  . triamcinolone acetonide (KENALOG) 10 MG/ML injection 10 mg    Review of Systems  Constitutional: Negative.   HENT: Negative.   Respiratory: Negative.   Cardiovascular: Negative.   Musculoskeletal: Negative.   Skin: Negative.   Psychiatric/Behavioral: Negative.     Per HPI unless specifically indicated above     Objective:    BP 134/82 mmHg  Pulse 83  Temp(Src) 98.6 F (37 C)  Ht 5' 4.5" (1.638 m)  Wt 160 lb 9.6 oz (72.848 kg)  BMI 27.15 kg/m2  SpO2 98%  LMP 11/12/2014 (Exact Date)  Wt Readings from Last 3 Encounters:  11/21/14 160 lb 9.6 oz (72.848 kg)  02/18/14 147 lb (66.679 kg)  03/01/13 145 lb 8 oz (65.998 kg)    Physical Exam  Constitutional: She is oriented to person,  place, and time. She appears well-developed and well-nourished.  Eyes: Conjunctivae and EOM are normal. Pupils are equal, round, and reactive to light.  Neck: Normal range of motion. Neck supple. No thyromegaly present.  Cardiovascular: Normal rate, regular rhythm and normal  heart sounds.   Pulmonary/Chest: Effort normal and breath sounds normal.  Lymphadenopathy:    She has no cervical adenopathy.  Neurological: She is alert and oriented to person, place, and time. She has normal reflexes.  Skin: Skin is warm and dry.  Psychiatric: She has a normal mood and affect. Her behavior is normal. Judgment and thought content normal.       Assessment & Plan:   Problem List Items Addressed This Visit    Hypertension    Under good control today. Continue to monitor. Will check microalbumin and BMP today. If spilling protein consider low dose ACE. Call if BP starting to raise again.       Anxiety    Slightly exacerbated due to her daughter going to be starting college and moving away. Does not feel like she needs to increase her medication. Feeling well. Refill given today. Call with any concerns or complaints.       Relevant Medications   escitalopram (LEXAPRO) 20 MG tablet   Hypothyroidism - Primary (Chronic)   Relevant Orders   TSH    Other Visit Diagnoses    Benign hypertension        Relevant Orders    Basic metabolic panel    Microalbumin / creatinine urine ratio       Due for her physical next visit. No need for Pap. Normal with negative HPV in 2015.   Follow up plan: Return in about 6 months (around 05/23/2015) for PE.

## 2014-11-21 NOTE — Patient Instructions (Signed)
Generalized Anxiety Disorder Generalized anxiety disorder (GAD) is a mental disorder. It interferes with life functions, including relationships, work, and school. GAD is different from normal anxiety, which everyone experiences at some point in their lives in response to specific life events and activities. Normal anxiety actually helps us prepare for and get through these life events and activities. Normal anxiety goes away after the event or activity is over.  GAD causes anxiety that is not necessarily related to specific events or activities. It also causes excess anxiety in proportion to specific events or activities. The anxiety associated with GAD is also difficult to control. GAD can vary from mild to severe. People with severe GAD can have intense waves of anxiety with physical symptoms (panic attacks).  SYMPTOMS The anxiety and worry associated with GAD are difficult to control. This anxiety and worry are related to many life events and activities and also occur more days than not for 6 months or longer. People with GAD also have three or more of the following symptoms (one or more in children):  Restlessness.   Fatigue.  Difficulty concentrating.   Irritability.  Muscle tension.  Difficulty sleeping or unsatisfying sleep. DIAGNOSIS GAD is diagnosed through an assessment by your health care provider. Your health care provider will ask you questions aboutyour mood,physical symptoms, and events in your life. Your health care provider may ask you about your medical history and use of alcohol or drugs, including prescription medicines. Your health care provider may also do a physical exam and blood tests. Certain medical conditions and the use of certain substances can cause symptoms similar to those associated with GAD. Your health care provider may refer you to a mental health specialist for further evaluation. TREATMENT The following therapies are usually used to treat GAD:    Medication. Antidepressant medication usually is prescribed for long-term daily control. Antianxiety medicines may be added in severe cases, especially when panic attacks occur.   Talk therapy (psychotherapy). Certain types of talk therapy can be helpful in treating GAD by providing support, education, and guidance. A form of talk therapy called cognitive behavioral therapy can teach you healthy ways to think about and react to daily life events and activities.  Stress managementtechniques. These include yoga, meditation, and exercise and can be very helpful when they are practiced regularly. A mental health specialist can help determine which treatment is best for you. Some people see improvement with one therapy. However, other people require a combination of therapies. Document Released: 09/28/2012 Document Revised: 10/18/2013 Document Reviewed: 09/28/2012 [employer] Patient Information 2015 [employer]. This information is not intended to replace advice given to you by your health care provider. Make sure you discuss any questions you have with your health care provider.  

## 2014-11-21 NOTE — Assessment & Plan Note (Signed)
Slightly exacerbated due to her daughter going to be starting college and moving away. Does not feel like she needs to increase her medication. Feeling well. Refill given today. Call with any concerns or complaints.

## 2014-11-21 NOTE — Assessment & Plan Note (Signed)
Under good control today. Continue to monitor. Will check microalbumin and BMP today. If spilling protein consider low dose ACE. Call if BP starting to raise again.

## 2014-11-22 LAB — BASIC METABOLIC PANEL
BUN/Creatinine Ratio: 27 — ABNORMAL HIGH (ref 9–23)
BUN: 13 mg/dL (ref 6–24)
CO2: 26 mmol/L (ref 18–29)
Calcium: 9.1 mg/dL (ref 8.7–10.2)
Chloride: 102 mmol/L (ref 97–108)
Creatinine, Ser: 0.49 mg/dL — ABNORMAL LOW (ref 0.57–1.00)
GFR calc Af Amer: 130 mL/min/{1.73_m2} (ref >59)
GFR calc non Af Amer: 113 mL/min/{1.73_m2} (ref >59)
Glucose: 87 mg/dL (ref 65–99)
Potassium: 4.3 mmol/L (ref 3.5–5.2)
SODIUM: 142 mmol/L (ref 134–144)

## 2014-11-22 LAB — TSH: TSH: 0.849 u[IU]/mL (ref 0.450–4.500)

## 2014-11-22 MED ORDER — LIOTHYRONINE SODIUM 5 MCG PO TABS: 5.0000 ug | ORAL_TABLET | Freq: Every day | ORAL | Status: DC

## 2014-11-22 MED ORDER — LEVOTHYROXINE SODIUM 25 MCG PO TABS: 25.0000 ug | ORAL_TABLET | Freq: Every day | ORAL | Status: DC

## 2014-11-22 NOTE — Assessment & Plan Note (Signed)
TSH checked today. Await results. Adjust medication as needed. Continue to monitor.   Addendum 11/22/14: TSH came back slightly low, but within normal limits. Will continue current dose of medications. Sent to her pharmacy.

## 2014-11-22 NOTE — Addendum Note (Signed)
Addended by: Valerie Roys on: 11/22/2014 08:27 AM   Modules accepted: Orders

## 2014-12-08 LAB — MICROALBUMIN, URINE WAIVED
Creatinine, Urine Waived: 200 mg/dL (ref 10–300)
Microalb, Ur Waived: 30 mg/L — ABNORMAL HIGH (ref 0–19)

## 2015-05-23 ENCOUNTER — Encounter: Payer: Self-pay | Admitting: Family Medicine

## 2015-05-23 ENCOUNTER — Ambulatory Visit (INDEPENDENT_AMBULATORY_CARE_PROVIDER_SITE_OTHER): Payer: 59 | Admitting: Family Medicine

## 2015-05-23 VITALS — BP 136/81 | HR 91 | Temp 99.3°F | Ht 63.0 in | Wt 156.0 lb

## 2015-05-23 DIAGNOSIS — Z0000 Encounter for general adult medical examination without abnormal findings: Secondary | ICD-10-CM

## 2015-05-23 DIAGNOSIS — E039 Hypothyroidism, unspecified: Secondary | ICD-10-CM | POA: Diagnosis not present

## 2015-05-23 DIAGNOSIS — F419 Anxiety disorder, unspecified: Secondary | ICD-10-CM | POA: Diagnosis not present

## 2015-05-23 DIAGNOSIS — I10 Essential (primary) hypertension: Secondary | ICD-10-CM | POA: Diagnosis not present

## 2015-05-23 MED ORDER — ESCITALOPRAM OXALATE 20 MG PO TABS: 20.0000 mg | ORAL_TABLET | Freq: Every day | ORAL | Status: DC

## 2015-05-23 NOTE — Patient Instructions (Signed)
Preventive Care for Adults, Female A healthy lifestyle and preventive care can promote health and wellness. Preventive health guidelines for women include the following key practices.  A routine yearly physical is a good way to check with your health care provider about your health and preventive screening. It is a chance to share any concerns and updates on your health and to receive a thorough exam.  Visit your dentist for a routine exam and preventive care every 6 months. Brush your teeth twice a day and floss once a day. Good oral hygiene prevents tooth decay and gum disease.  The frequency of eye exams is based on your age, health, family medical history, use of contact lenses, and other factors. Follow your health care provider's recommendations for frequency of eye exams.  Eat a healthy diet. Foods like vegetables, fruits, whole grains, low-fat dairy products, and lean protein foods contain the nutrients you need without too many calories. Decrease your intake of foods high in solid fats, added sugars, and salt. Eat the right amount of calories for you.Get information about a proper diet from your health care provider, if necessary.  Regular physical exercise is one of the most important things you can do for your health. Most adults should get at least 150 minutes of moderate-intensity exercise (any activity that increases your heart rate and causes you to sweat) each week. In addition, most adults need muscle-strengthening exercises on 2 or more days a week.  Maintain a healthy weight. The body mass index (BMI) is a screening tool to identify possible weight problems. It provides an estimate of body fat based on height and weight. Your health care provider can find your BMI and can help you achieve or maintain a healthy weight.For adults 20 years and older:  A BMI below 18.5 is considered underweight.  A BMI of 18.5 to 24.9 is normal.  A BMI of 25 to 29.9 is considered overweight.  A  BMI of 30 and above is considered obese.  Maintain normal blood lipids and cholesterol levels by exercising and minimizing your intake of saturated fat. Eat a balanced diet with plenty of fruit and vegetables. Blood tests for lipids and cholesterol should begin at age 45 and be repeated every 5 years. If your lipid or cholesterol levels are high, you are over 50, or you are at high risk for heart disease, you may need your cholesterol levels checked more frequently.Ongoing high lipid and cholesterol levels should be treated with medicines if diet and exercise are not working.  If you smoke, find out from your health care provider how to quit. If you do not use tobacco, do not start.  Lung cancer screening is recommended for adults aged 45-80 years who are at high risk for developing lung cancer because of a history of smoking. A yearly low-dose CT scan of the lungs is recommended for people who have at least a 30-pack-year history of smoking and are a current smoker or have quit within the past 15 years. A pack year of smoking is smoking an average of 1 pack of cigarettes a day for 1 year (for example: 1 pack a day for 30 years or 2 packs a day for 15 years). Yearly screening should continue until the smoker has stopped smoking for at least 15 years. Yearly screening should be stopped for people who develop a health problem that would prevent them from having lung cancer treatment.  If you are pregnant, do not drink alcohol. If you are  breastfeeding, be very cautious about drinking alcohol. If you are not pregnant and choose to drink alcohol, do not have more than 1 drink per day. One drink is considered to be 12 ounces (355 mL) of beer, 5 ounces (148 mL) of wine, or 1.5 ounces (44 mL) of liquor.  Avoid use of street drugs. Do not share needles with anyone. Ask for help if you need support or instructions about stopping the use of drugs.  High blood pressure causes heart disease and increases the risk  of stroke. Your blood pressure should be checked at least every 1 to 2 years. Ongoing high blood pressure should be treated with medicines if weight loss and exercise do not work.  If you are 55-79 years old, ask your health care provider if you should take aspirin to prevent strokes.  Diabetes screening is done by taking a blood sample to check your blood glucose level after you have not eaten for a certain period of time (fasting). If you are not overweight and you do not have risk factors for diabetes, you should be screened once every 3 years starting at age 45. If you are overweight or obese and you are 40-70 years of age, you should be screened for diabetes every year as part of your cardiovascular risk assessment.  Breast cancer screening is essential preventive care for women. You should practice "breast self-awareness." This means understanding the normal appearance and feel of your breasts and may include breast self-examination. Any changes detected, no matter how small, should be reported to a health care provider. Women in their 20s and 30s should have a clinical breast exam (CBE) by a health care provider as part of a regular health exam every 1 to 3 years. After age 40, women should have a CBE every year. Starting at age 40, women should consider having a mammogram (breast X-ray test) every year. Women who have a family history of breast cancer should talk to their health care provider about genetic screening. Women at a high risk of breast cancer should talk to their health care providers about having an MRI and a mammogram every year.  Breast cancer gene (BRCA)-related cancer risk assessment is recommended for women who have family members with BRCA-related cancers. BRCA-related cancers include breast, ovarian, tubal, and peritoneal cancers. Having family members with these cancers may be associated with an increased risk for harmful changes (mutations) in the breast cancer genes BRCA1 and  BRCA2. Results of the assessment will determine the need for genetic counseling and BRCA1 and BRCA2 testing.  Your health care provider may recommend that you be screened regularly for cancer of the pelvic organs (ovaries, uterus, and vagina). This screening involves a pelvic examination, including checking for microscopic changes to the surface of your cervix (Pap test). You may be encouraged to have this screening done every 3 years, beginning at age 21.  For women ages 30-65, health care providers may recommend pelvic exams and Pap testing every 3 years, or they may recommend the Pap and pelvic exam, combined with testing for human papilloma virus (HPV), every 5 years. Some types of HPV increase your risk of cervical cancer. Testing for HPV may also be done on women of any age with unclear Pap test results.  Other health care providers may not recommend any screening for nonpregnant women who are considered low risk for pelvic cancer and who do not have symptoms. Ask your health care provider if a screening pelvic exam is right for   you.  If you have had past treatment for cervical cancer or a condition that could lead to cancer, you need Pap tests and screening for cancer for at least 20 years after your treatment. If Pap tests have been discontinued, your risk factors (such as having a new sexual partner) need to be reassessed to determine if screening should resume. Some women have medical problems that increase the chance of getting cervical cancer. In these cases, your health care provider may recommend more frequent screening and Pap tests.  Colorectal cancer can be detected and often prevented. Most routine colorectal cancer screening begins at the age of 50 years and continues through age 75 years. However, your health care provider may recommend screening at an earlier age if you have risk factors for colon cancer. On a yearly basis, your health care provider may provide home test kits to check  for hidden blood in the stool. Use of a small camera at the end of a tube, to directly examine the colon (sigmoidoscopy or colonoscopy), can detect the earliest forms of colorectal cancer. Talk to your health care provider about this at age 50, when routine screening begins. Direct exam of the colon should be repeated every 5-10 years through age 75 years, unless early forms of precancerous polyps or small growths are found.  People who are at an increased risk for hepatitis B should be screened for this virus. You are considered at high risk for hepatitis B if:  You were born in a country where hepatitis B occurs often. Talk with your health care provider about which countries are considered high risk.  Your parents were born in a high-risk country and you have not received a shot to protect against hepatitis B (hepatitis B vaccine).  You have HIV or AIDS.  You use needles to inject street drugs.  You live with, or have sex with, someone who has hepatitis B.  You get hemodialysis treatment.  You take certain medicines for conditions like cancer, organ transplantation, and autoimmune conditions.  Hepatitis C blood testing is recommended for all people born from 1945 through 1965 and any individual with known risks for hepatitis C.  Practice safe sex. Use condoms and avoid high-risk sexual practices to reduce the spread of sexually transmitted infections (STIs). STIs include gonorrhea, chlamydia, syphilis, trichomonas, herpes, HPV, and human immunodeficiency virus (HIV). Herpes, HIV, and HPV are viral illnesses that have no cure. They can result in disability, cancer, and death.  You should be screened for sexually transmitted illnesses (STIs) including gonorrhea and chlamydia if:  You are sexually active and are younger than 24 years.  You are older than 24 years and your health care provider tells you that you are at risk for this type of infection.  Your sexual activity has changed  since you were last screened and you are at an increased risk for chlamydia or gonorrhea. Ask your health care provider if you are at risk.  If you are at risk of being infected with HIV, it is recommended that you take a prescription medicine daily to prevent HIV infection. This is called preexposure prophylaxis (PrEP). You are considered at risk if:  You are sexually active and do not regularly use condoms or know the HIV status of your partner(s).  You take drugs by injection.  You are sexually active with a partner who has HIV.  Talk with your health care provider about whether you are at high risk of being infected with HIV. If   you choose to begin PrEP, you should first be tested for HIV. You should then be tested every 3 months for as long as you are taking PrEP.  Osteoporosis is a disease in which the bones lose minerals and strength with aging. This can result in serious bone fractures or breaks. The risk of osteoporosis can be identified using a bone density scan. Women ages 67 years and over and women at risk for fractures or osteoporosis should discuss screening with their health care providers. Ask your health care provider whether you should take a calcium supplement or vitamin D to reduce the rate of osteoporosis.  Menopause can be associated with physical symptoms and risks. Hormone replacement therapy is available to decrease symptoms and risks. You should talk to your health care provider about whether hormone replacement therapy is right for you.  Use sunscreen. Apply sunscreen liberally and repeatedly throughout the day. You should seek shade when your shadow is shorter than you. Protect yourself by wearing long sleeves, pants, a wide-brimmed hat, and sunglasses year round, whenever you are outdoors.  Once a month, do a whole body skin exam, using a mirror to look at the skin on your back. Tell your health care provider of new moles, moles that have irregular borders, moles that  are larger than a pencil eraser, or moles that have changed in shape or color.  Stay current with required vaccines (immunizations).  Influenza vaccine. All adults should be immunized every year.  Tetanus, diphtheria, and acellular pertussis (Td, Tdap) vaccine. Pregnant women should receive 1 dose of Tdap vaccine during each pregnancy. The dose should be obtained regardless of the length of time since the last dose. Immunization is preferred during the 27th-36th week of gestation. An adult who has not previously received Tdap or who does not know her vaccine status should receive 1 dose of Tdap. This initial dose should be followed by tetanus and diphtheria toxoids (Td) booster doses every 10 years. Adults with an unknown or incomplete history of completing a 3-dose immunization series with Td-containing vaccines should begin or complete a primary immunization series including a Tdap dose. Adults should receive a Td booster every 10 years.  Varicella vaccine. An adult without evidence of immunity to varicella should receive 2 doses or a second dose if she has previously received 1 dose. Pregnant females who do not have evidence of immunity should receive the first dose after pregnancy. This first dose should be obtained before leaving the health care facility. The second dose should be obtained 4-8 weeks after the first dose.  Human papillomavirus (HPV) vaccine. Females aged 13-26 years who have not received the vaccine previously should obtain the 3-dose series. The vaccine is not recommended for use in pregnant females. However, pregnancy testing is not needed before receiving a dose. If a female is found to be pregnant after receiving a dose, no treatment is needed. In that case, the remaining doses should be delayed until after the pregnancy. Immunization is recommended for any person with an immunocompromised condition through the age of 61 years if she did not get any or all doses earlier. During the  3-dose series, the second dose should be obtained 4-8 weeks after the first dose. The third dose should be obtained 24 weeks after the first dose and 16 weeks after the second dose.  Zoster vaccine. One dose is recommended for adults aged 30 years or older unless certain conditions are present.  Measles, mumps, and rubella (MMR) vaccine. Adults born  before 1957 generally are considered immune to measles and mumps. Adults born in 1957 or later should have 1 or more doses of MMR vaccine unless there is a contraindication to the vaccine or there is laboratory evidence of immunity to each of the three diseases. A routine second dose of MMR vaccine should be obtained at least 28 days after the first dose for students attending postsecondary schools, health care workers, or international travelers. People who received inactivated measles vaccine or an unknown type of measles vaccine during 1963-1967 should receive 2 doses of MMR vaccine. People who received inactivated mumps vaccine or an unknown type of mumps vaccine before 1979 and are at high risk for mumps infection should consider immunization with 2 doses of MMR vaccine. For females of childbearing age, rubella immunity should be determined. If there is no evidence of immunity, females who are not pregnant should be vaccinated. If there is no evidence of immunity, females who are pregnant should delay immunization until after pregnancy. Unvaccinated health care workers born before 1957 who lack laboratory evidence of measles, mumps, or rubella immunity or laboratory confirmation of disease should consider measles and mumps immunization with 2 doses of MMR vaccine or rubella immunization with 1 dose of MMR vaccine.  Pneumococcal 13-valent conjugate (PCV13) vaccine. When indicated, a person who is uncertain of his immunization history and has no record of immunization should receive the PCV13 vaccine. All adults 65 years of age and older should receive this  vaccine. An adult aged 19 years or older who has certain medical conditions and has not been previously immunized should receive 1 dose of PCV13 vaccine. This PCV13 should be followed with a dose of pneumococcal polysaccharide (PPSV23) vaccine. Adults who are at high risk for pneumococcal disease should obtain the PPSV23 vaccine at least 8 weeks after the dose of PCV13 vaccine. Adults older than 51 years of age who have normal immune system function should obtain the PPSV23 vaccine dose at least 1 year after the dose of PCV13 vaccine.  Pneumococcal polysaccharide (PPSV23) vaccine. When PCV13 is also indicated, PCV13 should be obtained first. All adults aged 65 years and older should be immunized. An adult younger than age 65 years who has certain medical conditions should be immunized. Any person who resides in a nursing home or long-term care facility should be immunized. An adult smoker should be immunized. People with an immunocompromised condition and certain other conditions should receive both PCV13 and PPSV23 vaccines. People with human immunodeficiency virus (HIV) infection should be immunized as soon as possible after diagnosis. Immunization during chemotherapy or radiation therapy should be avoided. Routine use of PPSV23 vaccine is not recommended for American Indians, [place] Natives, or people younger than 65 years unless there are medical conditions that require PPSV23 vaccine. When indicated, people who have unknown immunization and have no record of immunization should receive PPSV23 vaccine. One-time revaccination 5 years after the first dose of PPSV23 is recommended for people aged 19-64 years who have chronic kidney failure, nephrotic syndrome, asplenia, or immunocompromised conditions. People who received 1-2 doses of PPSV23 before age 65 years should receive another dose of PPSV23 vaccine at age 65 years or later if at least 5 years have passed since the previous dose. Doses of PPSV23 are not  needed for people immunized with PPSV23 at or after age 65 years.  Meningococcal vaccine. Adults with asplenia or persistent complement component deficiencies should receive 2 doses of quadrivalent meningococcal conjugate (MenACWY-D) vaccine. The doses should be obtained   at least 2 months apart. Microbiologists working with certain meningococcal bacteria, [place] recruits, people at risk during an outbreak, and people who travel to or live in countries with a high rate of meningitis should be immunized. A first-year college student up through age 34 years who is living in a residence hall should receive a dose if she did not receive a dose on or after her 16th birthday. Adults who have certain high-risk conditions should receive one or more doses of vaccine.  Hepatitis A vaccine. Adults who wish to be protected from this disease, have certain high-risk conditions, work with hepatitis A-infected animals, work in hepatitis A research labs, or travel to or work in countries with a high rate of hepatitis A should be immunized. Adults who were previously unvaccinated and who anticipate close contact with an international adoptee during the first 60 days after arrival in the [place] from a country with a high rate of hepatitis A should be immunized.  Hepatitis B vaccine. Adults who wish to be protected from this disease, have certain high-risk conditions, may be exposed to blood or other infectious body fluids, are household contacts or sex partners of hepatitis B positive people, are clients or workers in certain care facilities, or travel to or work in countries with a high rate of hepatitis B should be immunized.  Haemophilus influenzae type b (Hib) vaccine. A previously unvaccinated person with asplenia or sickle cell disease or having a scheduled splenectomy should receive 1 dose of Hib vaccine. Regardless of previous immunization, a recipient of a hematopoietic stem cell transplant should receive a  3-dose series 6-12 months after her successful transplant. Hib vaccine is not recommended for adults with HIV infection. Preventive Services / Frequency Ages 35 to 4 years  Blood pressure check.** / Every 3-5 years.  Lipid and cholesterol check.** / Every 5 years beginning at age 60.  Clinical breast exam.** / Every 3 years for women in their 71s and 10s.  BRCA-related cancer risk assessment.** / For women who have family members with a BRCA-related cancer (breast, ovarian, tubal, or peritoneal cancers).  Pap test.** / Every 2 years from ages 76 through 26. Every 3 years starting at age 61 through age 76 or 93 with a history of 3 consecutive normal Pap tests.  HPV screening.** / Every 3 years from ages 37 through ages 60 to 51 with a history of 3 consecutive normal Pap tests.  Hepatitis C blood test.** / For any individual with known risks for hepatitis C.  Skin self-exam. / Monthly.  Influenza vaccine. / Every year.  Tetanus, diphtheria, and acellular pertussis (Tdap, Td) vaccine.** / Consult your health care provider. Pregnant women should receive 1 dose of Tdap vaccine during each pregnancy. 1 dose of Td every 10 years.  Varicella vaccine.** / Consult your health care provider. Pregnant females who do not have evidence of immunity should receive the first dose after pregnancy.  HPV vaccine. / 3 doses over 6 months, if 93 and younger. The vaccine is not recommended for use in pregnant females. However, pregnancy testing is not needed before receiving a dose.  Measles, mumps, rubella (MMR) vaccine.** / You need at least 1 dose of MMR if you were born in 1957 or later. You may also need a 2nd dose. For females of childbearing age, rubella immunity should be determined. If there is no evidence of immunity, females who are not pregnant should be vaccinated. If there is no evidence of immunity, females who are  pregnant should delay immunization until after pregnancy.  Pneumococcal  13-valent conjugate (PCV13) vaccine.** / Consult your health care provider.  Pneumococcal polysaccharide (PPSV23) vaccine.** / 1 to 2 doses if you smoke cigarettes or if you have certain conditions.  Meningococcal vaccine.** / 1 dose if you are age 68 to 8 years and a Market researcher living in a residence hall, or have one of several medical conditions, you need to get vaccinated against meningococcal disease. You may also need additional booster doses.  Hepatitis A vaccine.** / Consult your health care provider.  Hepatitis B vaccine.** / Consult your health care provider.  Haemophilus influenzae type b (Hib) vaccine.** / Consult your health care provider. Ages 7 to 53 years  Blood pressure check.** / Every year.  Lipid and cholesterol check.** / Every 5 years beginning at age 25 years.  Lung cancer screening. / Every year if you are aged 11-80 years and have a 30-pack-year history of smoking and currently smoke or have quit within the past 15 years. Yearly screening is stopped once you have quit smoking for at least 15 years or develop a health problem that would prevent you from having lung cancer treatment.  Clinical breast exam.** / Every year after age 48 years.  BRCA-related cancer risk assessment.** / For women who have family members with a BRCA-related cancer (breast, ovarian, tubal, or peritoneal cancers).  Mammogram.** / Every year beginning at age 41 years and continuing for as long as you are in good health. Consult with your health care provider.  Pap test.** / Every 3 years starting at age 65 years through age 37 or 70 years with a history of 3 consecutive normal Pap tests.  HPV screening.** / Every 3 years from ages 72 years through ages 60 to 40 years with a history of 3 consecutive normal Pap tests.  Fecal occult blood test (FOBT) of stool. / Every year beginning at age 21 years and continuing until age 5 years. You may not need to do this test if you get  a colonoscopy every 10 years.  Flexible sigmoidoscopy or colonoscopy.** / Every 5 years for a flexible sigmoidoscopy or every 10 years for a colonoscopy beginning at age 35 years and continuing until age 48 years.  Hepatitis C blood test.** / For all people born from [birth year] through 1965 and any individual with known risks for hepatitis C.  Skin self-exam. / Monthly.  Influenza vaccine. / Every year.  Tetanus, diphtheria, and acellular pertussis (Tdap/Td) vaccine.** / Consult your health care provider. Pregnant women should receive 1 dose of Tdap vaccine during each pregnancy. 1 dose of Td every 10 years.  Varicella vaccine.** / Consult your health care provider. Pregnant females who do not have evidence of immunity should receive the first dose after pregnancy.  Zoster vaccine.** / 1 dose for adults aged 30 years or older.  Measles, mumps, rubella (MMR) vaccine.** / You need at least 1 dose of MMR if you were born in 1957 or later. You may also need a second dose. For females of childbearing age, rubella immunity should be determined. If there is no evidence of immunity, females who are not pregnant should be vaccinated. If there is no evidence of immunity, females who are pregnant should delay immunization until after pregnancy.  Pneumococcal 13-valent conjugate (PCV13) vaccine.** / Consult your health care provider.  Pneumococcal polysaccharide (PPSV23) vaccine.** / 1 to 2 doses if you smoke cigarettes or if you have certain conditions.  Meningococcal vaccine.** /  Consult your health care provider.  Hepatitis A vaccine.** / Consult your health care provider.  Hepatitis B vaccine.** / Consult your health care provider.  Haemophilus influenzae type b (Hib) vaccine.** / Consult your health care provider. Ages 44 years and over  Blood pressure check.** / Every year.  Lipid and cholesterol check.** / Every 5 years beginning at age 25 years.  Lung cancer screening. / Every year if you  are aged 32-80 years and have a 30-pack-year history of smoking and currently smoke or have quit within the past 15 years. Yearly screening is stopped once you have quit smoking for at least 15 years or develop a health problem that would prevent you from having lung cancer treatment.  Clinical breast exam.** / Every year after age 69 years.  BRCA-related cancer risk assessment.** / For women who have family members with a BRCA-related cancer (breast, ovarian, tubal, or peritoneal cancers).  Mammogram.** / Every year beginning at age 47 years and continuing for as long as you are in good health. Consult with your health care provider.  Pap test.** / Every 3 years starting at age 63 years through age 56 or 61 years with 3 consecutive normal Pap tests. Testing can be stopped between 65 and 70 years with 3 consecutive normal Pap tests and no abnormal Pap or HPV tests in the past 10 years.  HPV screening.** / Every 3 years from ages 75 years through ages 66 or 60 years with a history of 3 consecutive normal Pap tests. Testing can be stopped between 65 and 70 years with 3 consecutive normal Pap tests and no abnormal Pap or HPV tests in the past 10 years.  Fecal occult blood test (FOBT) of stool. / Every year beginning at age 67 years and continuing until age 62 years. You may not need to do this test if you get a colonoscopy every 10 years.  Flexible sigmoidoscopy or colonoscopy.** / Every 5 years for a flexible sigmoidoscopy or every 10 years for a colonoscopy beginning at age 34 years and continuing until age 32 years.  Hepatitis C blood test.** / For all people born from [birth year] through 1965 and any individual with known risks for hepatitis C.  Osteoporosis screening.** / A one-time screening for women ages 16 years and over and women at risk for fractures or osteoporosis.  Skin self-exam. / Monthly.  Influenza vaccine. / Every year.  Tetanus, diphtheria, and acellular pertussis (Tdap/Td)  vaccine.** / 1 dose of Td every 10 years.  Varicella vaccine.** / Consult your health care provider.  Zoster vaccine.** / 1 dose for adults aged 41 years or older.  Pneumococcal 13-valent conjugate (PCV13) vaccine.** / Consult your health care provider.  Pneumococcal polysaccharide (PPSV23) vaccine.** / 1 dose for all adults aged 57 years and older.  Meningococcal vaccine.** / Consult your health care provider.  Hepatitis A vaccine.** / Consult your health care provider.  Hepatitis B vaccine.** / Consult your health care provider.  Haemophilus influenzae type b (Hib) vaccine.** / Consult your health care provider. ** Family history and personal history of risk and conditions may change your health care provider's recommendations.   This information is not intended to replace advice given to you by your health care provider. Make sure you discuss any questions you have with your health care provider.   Document Released: 07/30/2001 Document Revised: 06/24/2014 Document Reviewed: 10/29/2010 Elsevier Interactive Patient Education [employer].

## 2015-05-23 NOTE — Assessment & Plan Note (Signed)
Under good control. Continue current regimen. Continue to monitor.  

## 2015-05-23 NOTE — Assessment & Plan Note (Signed)
Under good control on current regimen. Continue current regimen. Check back in in 6 months.

## 2015-05-23 NOTE — Assessment & Plan Note (Signed)
Rechecking labs today. Adjust dose as needed.

## 2015-05-23 NOTE — Progress Notes (Signed)
BP 136/81 mmHg  Pulse 91  Temp(Src) 99.3 F (37.4 C)  Ht 5\' 3"  (1.6 m)  Wt 156 lb (70.761 kg)  BMI 27.64 kg/m2  SpO2 99%  LMP 04/24/2015 (Approximate)   Subjective:    Patient ID: Nichole Gordon, female    DOB: 1965-09-03, 49 y.o.   MRN: [id number]  HPI: Nichole Gordon is a 49 y.o. female presenting on 05/23/2015 for comprehensive medical examination. Current medical complaints include: none  She currently lives with: her husband- daughter in college Menopausal Symptoms: no  Depression Screen done today and results listed below:  Depression screen [facility] 2/9 05/23/2015  Decreased Interest 0  Down, Depressed, Hopeless 0  PHQ - 2 Score 0  Altered sleeping 0  Tired, decreased energy 1  Change in appetite 0  Feeling bad or failure about yourself  0  Trouble concentrating 0  Moving slowly or fidgety/restless 0  Suicidal thoughts 0  PHQ-9 Score 1  Difficult doing work/chores Not difficult at all   The patient does not have a history of falls. I did not complete a risk assessment for falls. A plan of care for falls was not documented.  Past Medical History:  Past Medical History  Diagnosis Date  . Palpitations   . Anxiety   . Hypertension   . Fibrocystic breast disease    Surgical History:  Past Surgical History  Procedure Laterality Date  . Lithotripsy  2012    Medications:  Current Outpatient Prescriptions on File Prior to Visit  Medication Sig  . Cholecalciferol (VITAMIN D) 2000 UNITS CAPS Take by mouth.    . escitalopram (LEXAPRO) 20 MG tablet Take 1 tablet (20 mg total) by mouth daily.  Marland Kitchen levothyroxine (SYNTHROID, LEVOTHROID) 25 MCG tablet Take 1 tablet (25 mcg total) by mouth daily.  Marland Kitchen liothyronine (CYTOMEL) 5 MCG tablet Take 1 tablet (5 mcg total) by mouth daily.  . Multiple Vitamin (MULTIVITAMIN) capsule Take 1 capsule by mouth daily.     Current Facility-Administered Medications on File Prior to Visit  Medication  . triamcinolone acetonide (KENALOG) 10 MG/ML  injection 10 mg    Allergies:  Allergies  Allergen Reactions  . Amoxicillin Rash  . Omnipaque [Iohexol] Shortness Of Breath  . Phenytoin Sodium Extended Swelling  . Iodinated Diagnostic Agents Itching  . Trileptal [Oxcarbazepine] Swelling    Social History:  Social History   Social History  . Marital Status: Married    Spouse Name: N/A  . Number of Children: N/A  . Years of Education: N/A   Occupational History  . Not on file.   Social History Main Topics  . Smoking status: Never Smoker   . Smokeless tobacco: Never Used  . Alcohol Use: No  . Drug Use: No  . Sexual Activity: Not on file   Other Topics Concern  . Not on file   Social History Narrative   History  Smoking status  . Never Smoker   Smokeless tobacco  . Never Used   History  Alcohol Use No    Family History:  Family History  Problem Relation Age of Onset  . Cancer Father   . Hyperlipidemia Mother   . Hypertension Mother   . Arthritis Mother   . Hypertension Brother     Past medical history, surgical history, medications, allergies, family history and social history reviewed with patient today and changes made to appropriate areas of the chart.   Review of Systems  Constitutional: Positive for diaphoresis. Negative for fever, chills,  weight loss and malaise/fatigue.  Eyes: Negative.   Respiratory: Negative.   Cardiovascular: Negative.   Gastrointestinal: Positive for heartburn. Negative for nausea, vomiting, abdominal pain, diarrhea, constipation, blood in stool and melena.  Genitourinary: Negative.   Musculoskeletal: Positive for neck pain. Negative for myalgias, back pain, joint pain and falls.  Skin: Negative.   Neurological: Negative.  Negative for weakness.  Endo/Heme/Allergies: Positive for environmental allergies. Negative for polydipsia. Does not bruise/bleed easily.  Psychiatric/Behavioral: Negative.     All other ROS negative except what is listed above and in the HPI.       Objective:    BP 136/81 mmHg  Pulse 91  Temp(Src) 99.3 F (37.4 C)  Ht 5\' 3"  (1.6 m)  Wt 156 lb (70.761 kg)  BMI 27.64 kg/m2  SpO2 99%  LMP 04/24/2015 (Approximate)  Wt Readings from Last 3 Encounters:  05/23/15 156 lb (70.761 kg)  11/21/14 160 lb 9.6 oz (72.848 kg)  02/18/14 147 lb (66.679 kg)    Physical Exam  Constitutional: She is oriented to person, place, and time. She appears well-developed and well-nourished. No distress.  HENT:  Head: Normocephalic and atraumatic.  Right Ear: Hearing, tympanic membrane, external ear and ear canal normal.  Left Ear: Hearing, tympanic membrane, external ear and ear canal normal.  Nose: Nose normal.  Mouth/Throat: Uvula is midline, oropharynx is clear and moist and mucous membranes are normal. No oropharyngeal exudate.  Eyes: Conjunctivae, EOM and lids are normal. Pupils are equal, round, and reactive to light. Right eye exhibits no discharge. Left eye exhibits no discharge. No scleral icterus.  Neck: Normal range of motion. Neck supple. No JVD present. Carotid bruit is not present. No tracheal deviation present. No thyroid mass and no thyromegaly present.  Cardiovascular: Normal rate, regular rhythm, normal heart sounds and intact distal pulses.  Exam reveals no gallop and no friction rub.   No murmur heard. Pulmonary/Chest: Effort normal and breath sounds normal. No stridor. No respiratory distress. She has no decreased breath sounds. She has no wheezes. She has no rhonchi. She has no rales. She exhibits no tenderness. Right breast exhibits no inverted nipple, no mass, no nipple discharge, no skin change and no tenderness. Left breast exhibits no inverted nipple, no mass, no nipple discharge, no skin change and no tenderness. Breasts are symmetrical.  Fibrocystic changes in inner quadrants bilaterally  Abdominal: Soft. Bowel sounds are normal. She exhibits no distension and no mass. There is no tenderness. There is no rebound and no guarding.   Genitourinary:  Deferred with shared decision making  Musculoskeletal: Normal range of motion. She exhibits no edema or tenderness.  Lymphadenopathy:    She has no cervical adenopathy.  Neurological: She is alert and oriented to person, place, and time. She has normal reflexes. She displays normal reflexes. No cranial nerve deficit. She exhibits normal muscle tone. Coordination normal.  Skin: Skin is warm, dry and intact. No rash noted. She is not diaphoretic. No erythema. No pallor.  Psychiatric: She has a normal mood and affect. Her speech is normal and behavior is normal. Judgment and thought content normal. Cognition and memory are normal.  Nursing note and vitals reviewed.   Results for orders placed or performed in visit on 11/21/14  HM MAMMOGRAPHY  Result Value Ref Range   HM Mammogram Category 1: benign   TSH  Result Value Ref Range   TSH 0.849 0.450 - 4.500 uIU/mL  Basic metabolic panel  Result Value Ref Range   Glucose 87  65 - 99 mg/dL   BUN 13 6 - 24 mg/dL   Creatinine, Ser 0.49 (L) 0.57 - 1.00 mg/dL   GFR calc non Af Amer 113 >59 mL/min/1.73   GFR calc Af Amer 130 >59 mL/min/1.73   BUN/Creatinine Ratio 27 (H) 9 - 23   Sodium 142 134 - 144 mmol/L   Potassium 4.3 3.5 - 5.2 mmol/L   Chloride 102 97 - 108 mmol/L   CO2 26 18 - 29 mmol/L   Calcium 9.1 8.7 - 10.2 mg/dL  Microalbumin, Urine Waived  Result Value Ref Range   Microalb, Ur Waived 30 (H) 0 - 19 mg/L   Creatinine, Urine Waived 200 10 - 300 mg/dL   Microalb/Creat Ratio <30 <30 mg/g  HM PAP SMEAR  Result Value Ref Range   HM Pap smear Negative, with negative HPV       Assessment & Plan:   Problem List Items Addressed This Visit      Cardiovascular and Mediastinum   Hypertension    Under good control. Continue current regimen. Continue to monitor.         Endocrine   Hypothyroidism (Chronic)    Rechecking labs today. Adjust dose as needed.         Other   Anxiety    Under good control on  current regimen. Continue current regimen. Check back in in 6 months.        Other Visit Diagnoses    Routine general medical examination at a health care facility    -  Primary    Up to date on pap, mammo and colonoscopy. Up to date on vaccines. Screening labs checked today. Work on diet and exercise. Continue to monitor.     Acute anxiety            Follow up plan: Return in about 6 months (around 11/21/2015) for Anxiety follow up.   LABORATORY TESTING:  - Pap smear: up to date  IMMUNIZATIONS:   - Tdap: Tetanus vaccination status reviewed: last tetanus booster within 10 years. - Influenza: Up to date - Pneumovax: Not applicable - Zostavax vaccine: Not applicable  SCREENING: -Mammogram: Up to date  - Colonoscopy: Up to date   PATIENT COUNSELING:   Advised to take 1 mg of folate supplement per day if capable of pregnancy.   Sexuality: Discussed sexually transmitted diseases, partner selection, use of condoms, avoidance of unintended pregnancy  and contraceptive alternatives.   Advised to avoid cigarette smoking.  I discussed with the patient that most people either abstain from alcohol or drink within safe limits (<=14/week and <=4 drinks/occasion for males, <=7/weeks and <= 3 drinks/occasion for females) and that the risk for alcohol disorders and other health effects rises proportionally with the number of drinks per week and how often a drinker exceeds daily limits.  Discussed cessation/primary prevention of drug use and availability of treatment for abuse.   Diet: Encouraged to adjust caloric intake to maintain  or achieve ideal body weight, to reduce intake of dietary saturated fat and total fat, to limit sodium intake by avoiding high sodium foods and not adding table salt, and to maintain adequate dietary potassium and calcium preferably from fresh fruits, vegetables, and low-fat dairy products.    stressed the importance of regular exercise  Injury prevention:  Discussed safety belts, safety helmets, smoke detector, smoking near bedding or upholstery.   Dental health: Discussed importance of regular tooth brushing, flossing, and dental visits.    NEXT PREVENTATIVE PHYSICAL  DUE IN 1 YEAR. Return in about 6 months (around 11/21/2015) for Anxiety follow up.

## 2015-05-24 ENCOUNTER — Telehealth: Payer: Self-pay | Admitting: Family Medicine

## 2015-05-24 ENCOUNTER — Other Ambulatory Visit: Payer: Self-pay | Admitting: Family Medicine

## 2015-05-24 ENCOUNTER — Encounter: Payer: Self-pay | Admitting: Family Medicine

## 2015-05-24 LAB — CBC WITH DIFFERENTIAL/PLATELET
BASOS: 0 %
Basophils Absolute: 0 10*3/uL (ref 0.0–0.2)
EOS (ABSOLUTE): 0.1 10*3/uL (ref 0.0–0.4)
Eos: 1 %
Hematocrit: 39.7 % (ref 34.0–46.6)
Hemoglobin: 13.2 g/dL (ref 11.1–15.9)
IMMATURE GRANULOCYTES: 0 %
Immature Grans (Abs): 0 10*3/uL (ref 0.0–0.1)
Lymphocytes Absolute: 2.3 10*3/uL (ref 0.7–3.1)
Lymphs: 35 %
MCH: 29.1 pg (ref 26.6–33.0)
MCHC: 33.2 g/dL (ref 31.5–35.7)
MCV: 88 fL (ref 79–97)
MONOS ABS: 0.4 10*3/uL (ref 0.1–0.9)
Monocytes: 6 %
NEUTROS ABS: 3.9 10*3/uL (ref 1.4–7.0)
NEUTROS PCT: 58 %
PLATELETS: 252 10*3/uL (ref 150–379)
RBC: 4.53 x10E6/uL (ref 3.77–5.28)
RDW: 14.1 % (ref 12.3–15.4)
WBC: 6.6 10*3/uL (ref 3.4–10.8)

## 2015-05-24 LAB — LIPID PANEL
CHOLESTEROL TOTAL: 206 mg/dL — AB (ref 100–199)
Chol/HDL Ratio: 3.6 ratio units (ref 0.0–4.4)
HDL: 58 mg/dL (ref >39)
LDL Calculated: 133 mg/dL — ABNORMAL HIGH (ref 0–99)
TRIGLYCERIDES: 74 mg/dL (ref 0–149)
VLDL Cholesterol Cal: 15 mg/dL (ref 5–40)

## 2015-05-24 LAB — TSH: TSH: 1.71 u[IU]/mL (ref 0.450–4.500)

## 2015-05-24 MED ORDER — LEVOTHYROXINE SODIUM 25 MCG PO TABS: 25.0000 ug | ORAL_TABLET | Freq: Every day | ORAL | Status: DC

## 2015-05-24 MED ORDER — LIOTHYRONINE SODIUM 5 MCG PO TABS: 5.0000 ug | ORAL_TABLET | Freq: Every day | ORAL | Status: DC

## 2015-05-24 NOTE — Telephone Encounter (Signed)
Called and LMOM. Labs stable. Thyroid meds sent to the pharmacy. Copy of labs mailed to her. Call with any questions.

## 2015-11-21 ENCOUNTER — Encounter: Payer: Self-pay | Admitting: Family Medicine

## 2015-11-21 ENCOUNTER — Ambulatory Visit (INDEPENDENT_AMBULATORY_CARE_PROVIDER_SITE_OTHER): Payer: 59 | Admitting: Family Medicine

## 2015-11-21 VITALS — BP 136/84 | HR 88 | Temp 98.9°F | Wt 163.0 lb

## 2015-11-21 DIAGNOSIS — E039 Hypothyroidism, unspecified: Secondary | ICD-10-CM

## 2015-11-21 DIAGNOSIS — F419 Anxiety disorder, unspecified: Secondary | ICD-10-CM

## 2015-11-21 MED ORDER — ESCITALOPRAM OXALATE 20 MG PO TABS: 20.0000 mg | ORAL_TABLET | Freq: Every day | ORAL | Status: DC

## 2015-11-21 NOTE — Assessment & Plan Note (Signed)
Under good control. No concerns. Continue to monitor.  

## 2015-11-21 NOTE — Assessment & Plan Note (Signed)
Under good control. Continue current regimen. Continue to monitor. Refills given.  

## 2015-11-21 NOTE — Progress Notes (Signed)
BP 136/84 mmHg  Pulse 88  Temp(Src) 98.9 F (37.2 C)  Wt 163 lb (73.936 kg)  SpO2 97%   Subjective:    Patient ID: Nichole Gordon, female    DOB: June 25, 1965, 50 y.o.   MRN: [id number]  HPI: Nichole Gordon is a 50 y.o. female  Chief Complaint  Patient presents with  . Anxiety  . Hypothyroidism   HYPOTHYROIDISM Thyroid control status:controlled Satisfied with current treatment? yes Medication side effects: no Medication compliance: excellent compliance Recent dose adjustment:no Fatigue: no Cold intolerance: no Heat intolerance: no Weight gain: no Weight loss: no Constipation: no Diarrhea/loose stools: no Palpitations: no Lower extremity edema: no Anxiety/depressed mood: yes  ANXIETY/STRESS Duration:controlled Anxious mood: yes  Excessive worrying: no Irritability: no  Sweating: no Nausea: no Palpitations:no Hyperventilation: no Panic attacks: no Agoraphobia: no  Obscessions/compulsions: no Depressed mood: no Depression screen PHQ 2/9 05/23/2015  Decreased Interest 0  Down, Depressed, Hopeless 0  PHQ - 2 Score 0  Altered sleeping 0  Tired, decreased energy 1  Change in appetite 0  Feeling bad or failure about yourself  0  Trouble concentrating 0  Moving slowly or fidgety/restless 0  Suicidal thoughts 0  PHQ-9 Score 1  Difficult doing work/chores Not difficult at all   Anhedonia: no Weight changes: no Insomnia: no   Hypersomnia: no Fatigue/loss of energy: no Feelings of worthlessness: no Feelings of guilt: no Impaired concentration/indecisiveness: no Suicidal ideations: no  Crying spells: no Recent Stressors/Life Changes: yes  Relevant past medical, surgical, family and social history reviewed and updated as indicated. Interim medical history since our last visit reviewed. Allergies and medications reviewed and updated.  Review of Systems  Constitutional: Negative.   Respiratory: Negative.   Cardiovascular: Negative.    Psychiatric/Behavioral: Negative.     Per HPI unless specifically indicated above     Objective:    BP 136/84 mmHg  Pulse 88  Temp(Src) 98.9 F (37.2 C)  Wt 163 lb (73.936 kg)  SpO2 97%  Wt Readings from Last 3 Encounters:  11/21/15 163 lb (73.936 kg)  05/23/15 156 lb (70.761 kg)  11/21/14 160 lb 9.6 oz (72.848 kg)    Physical Exam  Constitutional: She is oriented to person, place, and time. She appears well-developed and well-nourished. No distress.  HENT:  Head: Normocephalic and atraumatic.  Right Ear: Hearing normal.  Left Ear: Hearing normal.  Nose: Nose normal.  Eyes: Conjunctivae and lids are normal. Right eye exhibits no discharge. Left eye exhibits no discharge. No scleral icterus.  Cardiovascular: Normal rate, regular rhythm, normal heart sounds and intact distal pulses.  Exam reveals no gallop and no friction rub.   No murmur heard. Pulmonary/Chest: Effort normal and breath sounds normal. No respiratory distress. She has no wheezes. She has no rales. She exhibits no tenderness.  Musculoskeletal: Normal range of motion.  Neurological: She is alert and oriented to person, place, and time.  Skin: Skin is warm, dry and intact. No rash noted. She is not diaphoretic. No erythema. No pallor.  Psychiatric: She has a normal mood and affect. Her speech is normal and behavior is normal. Judgment and thought content normal. Cognition and memory are normal.  Nursing note and vitals reviewed.   Results for orders placed or performed in visit on 05/23/15  TSH  Result Value Ref Range   TSH 1.710 0.450 - 4.500 uIU/mL  Lipid Profile  Result Value Ref Range   Cholesterol, Total 206 (H) 100 - 199 mg/dL   Triglycerides  74 0 - 149 mg/dL   HDL 58 >39 mg/dL   VLDL Cholesterol Cal 15 5 - 40 mg/dL   LDL Calculated 133 (H) 0 - 99 mg/dL   Chol/HDL Ratio 3.6 0.0 - 4.4 ratio units  CBC w/Diff  Result Value Ref Range   WBC 6.6 3.4 - 10.8 x10E3/uL   RBC 4.53 3.77 - 5.28 x10E6/uL    Hemoglobin 13.2 11.1 - 15.9 g/dL   Hematocrit 39.7 34.0 - 46.6 %   MCV 88 79 - 97 fL   MCH 29.1 26.6 - 33.0 pg   MCHC 33.2 31.5 - 35.7 g/dL   RDW 14.1 12.3 - 15.4 %   Platelets 252 150 - 379 x10E3/uL   Neutrophils 58 %   Lymphs 35 %   Monocytes 6 %   Eos 1 %   Basos 0 %   Neutrophils Absolute 3.9 1.4 - 7.0 x10E3/uL   Lymphocytes Absolute 2.3 0.7 - 3.1 x10E3/uL   Monocytes Absolute 0.4 0.1 - 0.9 x10E3/uL   EOS (ABSOLUTE) 0.1 0.0 - 0.4 x10E3/uL   Basophils Absolute 0.0 0.0 - 0.2 x10E3/uL   Immature Granulocytes 0 %   Immature Grans (Abs) 0.0 0.0 - 0.1 x10E3/uL      Assessment & Plan:   Problem List Items Addressed This Visit      Endocrine   Hypothyroidism - Primary (Chronic)    Under good control. No concerns. Continue to monitor.         Other   Anxiety    Under good control. Continue current regimen. Continue to monitor. Refills given.       Relevant Medications   escitalopram (LEXAPRO) 20 MG tablet       Follow up plan: Return in about 6 months (around 05/22/2016) for Physical.

## 2016-05-23 ENCOUNTER — Encounter: Payer: Self-pay | Admitting: Family Medicine

## 2016-05-23 ENCOUNTER — Ambulatory Visit (INDEPENDENT_AMBULATORY_CARE_PROVIDER_SITE_OTHER): Payer: 59 | Admitting: Family Medicine

## 2016-05-23 VITALS — BP 148/78 | HR 88 | Temp 98.6°F | Ht 64.2 in | Wt 161.4 lb

## 2016-05-23 DIAGNOSIS — E039 Hypothyroidism, unspecified: Secondary | ICD-10-CM | POA: Diagnosis not present

## 2016-05-23 DIAGNOSIS — Z1239 Encounter for other screening for malignant neoplasm of breast: Secondary | ICD-10-CM

## 2016-05-23 DIAGNOSIS — Z13220 Encounter for screening for lipoid disorders: Secondary | ICD-10-CM | POA: Diagnosis not present

## 2016-05-23 DIAGNOSIS — Z114 Encounter for screening for human immunodeficiency virus [HIV]: Secondary | ICD-10-CM | POA: Diagnosis not present

## 2016-05-23 DIAGNOSIS — N200 Calculus of kidney: Secondary | ICD-10-CM

## 2016-05-23 DIAGNOSIS — Z0001 Encounter for general adult medical examination with abnormal findings: Secondary | ICD-10-CM

## 2016-05-23 DIAGNOSIS — Z0000 Encounter for general adult medical examination without abnormal findings: Secondary | ICD-10-CM

## 2016-05-23 DIAGNOSIS — Z1231 Encounter for screening mammogram for malignant neoplasm of breast: Secondary | ICD-10-CM

## 2016-05-23 DIAGNOSIS — Z1159 Encounter for screening for other viral diseases: Secondary | ICD-10-CM | POA: Diagnosis not present

## 2016-05-23 DIAGNOSIS — I10 Essential (primary) hypertension: Secondary | ICD-10-CM | POA: Diagnosis not present

## 2016-05-23 DIAGNOSIS — F419 Anxiety disorder, unspecified: Secondary | ICD-10-CM

## 2016-05-23 LAB — UA/M W/RFLX CULTURE, ROUTINE
BILIRUBIN UA: NEGATIVE
Glucose, UA: NEGATIVE
Ketones, UA: NEGATIVE
Leukocytes, UA: NEGATIVE
Nitrite, UA: NEGATIVE
PH UA: 6 (ref 5.0–7.5)
PROTEIN UA: NEGATIVE
RBC UA: NEGATIVE
Specific Gravity, UA: 1.025 (ref 1.005–1.030)
Urobilinogen, Ur: 0.2 mg/dL (ref 0.2–1.0)

## 2016-05-23 LAB — MICROSCOPIC EXAMINATION: WBC, UA: NONE SEEN /hpf (ref 0–?)

## 2016-05-23 MED ORDER — ESCITALOPRAM OXALATE 20 MG PO TABS: 20.0000 mg | ORAL_TABLET | Freq: Every day | ORAL | 1 refills | Status: DC

## 2016-05-23 NOTE — Assessment & Plan Note (Signed)
Stable. Continue current regimen. Continue to monitor.  

## 2016-05-23 NOTE — Assessment & Plan Note (Signed)
No issues today. Call with any concerns.

## 2016-05-23 NOTE — Assessment & Plan Note (Signed)
Rechecking levels today. Adjust dose as needed. Call with any concerns.

## 2016-05-23 NOTE — Patient Instructions (Addendum)
Health Maintenance, Female Introduction Adopting a healthy lifestyle and getting preventive care can go a long way to promote health and wellness. Talk with your health care provider about what schedule of regular examinations is right for you. This is a good chance for you to check in with your provider about disease prevention and staying healthy. In between checkups, there are plenty of things you can do on your own. Experts have done a lot of research about which lifestyle changes and preventive measures are most likely to keep you healthy. Ask your health care provider for more information. Weight and diet Eat a healthy diet  Be sure to include plenty of vegetables, fruits, low-fat dairy products, and lean protein.  Do not eat a lot of foods high in solid fats, added sugars, or salt.  Get regular exercise. This is one of the most important things you can do for your health.  Most adults should exercise for at least 150 minutes each week. The exercise should increase your heart rate and make you sweat (moderate-intensity exercise).  Most adults should also do strengthening exercises at least twice a week. This is in addition to the moderate-intensity exercise. Maintain a healthy weight  Body mass index (BMI) is a measurement that can be used to identify possible weight problems. It estimates body fat based on height and weight. Your health care provider can help determine your BMI and help you achieve or maintain a healthy weight.  For females 63 years of age and older:  A BMI below 18.5 is considered underweight.  A BMI of 18.5 to 24.9 is normal.  A BMI of 25 to 29.9 is considered overweight.  A BMI of 30 and above is considered obese. Watch levels of cholesterol and blood lipids  You should start having your blood tested for lipids and cholesterol at 52 years of age, then have this test every 5 years.  You may need to have your cholesterol levels checked more often if:  Your  lipid or cholesterol levels are high.  You are older than 52 years of age.  You are at high risk for heart disease. Cancer screening Lung Cancer  Lung cancer screening is recommended for adults 56-22 years old who are at high risk for lung cancer because of a history of smoking.  A yearly low-dose CT scan of the lungs is recommended for people who:  Currently smoke.  Have quit within the past 15 years.  Have at least a 30-pack-year history of smoking. A pack year is smoking an average of one pack of cigarettes a day for 1 year.  Yearly screening should continue until it has been 15 years since you quit.  Yearly screening should stop if you develop a health problem that would prevent you from having lung cancer treatment. Breast Cancer  Practice breast self-awareness. This means understanding how your breasts normally appear and feel.  It also means doing regular breast self-exams. Let your health care provider know about any changes, no matter how small.  If you are in your 20s or 30s, you should have a clinical breast exam (CBE) by a health care provider every 1-3 years as part of a regular health exam.  If you are 35 or older, have a CBE every year. Also consider having a breast X-ray (mammogram) every year.  If you have a family history of breast cancer, talk to your health care provider about genetic screening.  If you are at high risk for breast cancer,  talk to your health care provider about having an MRI and a mammogram every year.  Breast cancer gene (BRCA) assessment is recommended for women who have family members with BRCA-related cancers. BRCA-related cancers include:  Breast.  Ovarian.  Tubal.  Peritoneal cancers.  Results of the assessment will determine the need for genetic counseling and BRCA1 and BRCA2 testing. Cervical Cancer  Your health care provider may recommend that you be screened regularly for cancer of the pelvic organs (ovaries, uterus, and  vagina). This screening involves a pelvic examination, including checking for microscopic changes to the surface of your cervix (Pap test). You may be encouraged to have this screening done every 3 years, beginning at age 21.  For women ages 30-65, health care providers may recommend pelvic exams and Pap testing every 3 years, or they may recommend the Pap and pelvic exam, combined with testing for human papilloma virus (HPV), every 5 years. Some types of HPV increase your risk of cervical cancer. Testing for HPV may also be done on women of any age with unclear Pap test results.  Other health care providers may not recommend any screening for nonpregnant women who are considered low risk for pelvic cancer and who do not have symptoms. Ask your health care provider if a screening pelvic exam is right for you.  If you have had past treatment for cervical cancer or a condition that could lead to cancer, you need Pap tests and screening for cancer for at least 20 years after your treatment. If Pap tests have been discontinued, your risk factors (such as having a new sexual partner) need to be reassessed to determine if screening should resume. Some women have medical problems that increase the chance of getting cervical cancer. In these cases, your health care provider may recommend more frequent screening and Pap tests. Colorectal Cancer  This type of cancer can be detected and often prevented.  Routine colorectal cancer screening usually begins at 52 years of age and continues through 52 years of age.  Your health care provider may recommend screening at an earlier age if you have risk factors for colon cancer.  Your health care provider may also recommend using home test kits to check for hidden blood in the stool.  A small camera at the end of a tube can be used to examine your colon directly (sigmoidoscopy or colonoscopy). This is done to check for the earliest forms of colorectal  cancer.  Routine screening usually begins at age 50.  Direct examination of the colon should be repeated every 5-10 years through 52 years of age. However, you may need to be screened more often if early forms of precancerous polyps or small growths are found. Skin Cancer  Check your skin from head to toe regularly.  Tell your health care provider about any new moles or changes in moles, especially if there is a change in a mole's shape or color.  Also tell your health care provider if you have a mole that is larger than the size of a pencil eraser.  Always use sunscreen. Apply sunscreen liberally and repeatedly throughout the day.  Protect yourself by wearing long sleeves, pants, a wide-brimmed hat, and sunglasses whenever you are outside. Heart disease, diabetes, and high blood pressure  High blood pressure causes heart disease and increases the risk of stroke. High blood pressure is more likely to develop in:  People who have blood pressure in the high end of the normal range (130-139/85-89 mm Hg).    People who are overweight or obese.  People who are African American.  If you are 18-39 years of age, have your blood pressure checked every 3-5 years. If you are 40 years of age or older, have your blood pressure checked every year. You should have your blood pressure measured twice-once when you are at a hospital or clinic, and once when you are not at a hospital or clinic. Record the average of the two measurements. To check your blood pressure when you are not at a hospital or clinic, you can use:  An automated blood pressure machine at a pharmacy.  A home blood pressure monitor.  If you are between 55 years and 79 years old, ask your health care provider if you should take aspirin to prevent strokes.  Have regular diabetes screenings. This involves taking a blood sample to check your fasting blood sugar level.  If you are at a normal weight and have a low risk for diabetes,  have this test once every three years after 52 years of age.  If you are overweight and have a high risk for diabetes, consider being tested at a younger age or more often. Preventing infection Hepatitis B  If you have a higher risk for hepatitis B, you should be screened for this virus. You are considered at high risk for hepatitis B if:  You were born in a country where hepatitis B is common. Ask your health care provider which countries are considered high risk.  Your parents were born in a high-risk country, and you have not been immunized against hepatitis B (hepatitis B vaccine).  You have HIV or AIDS.  You use needles to inject street drugs.  You live with someone who has hepatitis B.  You have had sex with someone who has hepatitis B.  You get hemodialysis treatment.  You take certain medicines for conditions, including cancer, organ transplantation, and autoimmune conditions. Hepatitis C  Blood testing is recommended for:  Everyone born from 1945 through 1965.  Anyone with known risk factors for hepatitis C. Sexually transmitted infections (STIs)  You should be screened for sexually transmitted infections (STIs) including gonorrhea and chlamydia if:  You are sexually active and are younger than 52 years of age.  You are older than 52 years of age and your health care provider tells you that you are at risk for this type of infection.  Your sexual activity has changed since you were last screened and you are at an increased risk for chlamydia or gonorrhea. Ask your health care provider if you are at risk.  If you do not have HIV, but are at risk, it may be recommended that you take a prescription medicine daily to prevent HIV infection. This is called pre-exposure prophylaxis (PrEP). You are considered at risk if:  You are sexually active and do not regularly use condoms or know the HIV status of your partner(s).  You take drugs by injection.  You are sexually  active with a partner who has HIV. Talk with your health care provider about whether you are at high risk of being infected with HIV. If you choose to begin PrEP, you should first be tested for HIV. You should then be tested every 3 months for as long as you are taking PrEP. Pregnancy  If you are premenopausal and you may become pregnant, ask your health care provider about preconception counseling.  If you may become pregnant, take 400 to 800 micrograms (mcg) of folic acid   every day.  If you want to prevent pregnancy, talk to your health care provider about birth control (contraception). Osteoporosis and menopause  Osteoporosis is a disease in which the bones lose minerals and strength with aging. This can result in serious bone fractures. Your risk for osteoporosis can be identified using a bone density scan.  If you are 74 years of age or older, or if you are at risk for osteoporosis and fractures, ask your health care provider if you should be screened.  Ask your health care provider whether you should take a calcium or vitamin D supplement to lower your risk for osteoporosis.  Menopause may have certain physical symptoms and risks.  Hormone replacement therapy may reduce some of these symptoms and risks. Talk to your health care provider about whether hormone replacement therapy is right for you. Follow these instructions at home:  Schedule regular health, dental, and eye exams.  Stay current with your immunizations.  Do not use any tobacco products including cigarettes, chewing tobacco, or electronic cigarettes.  If you are pregnant, do not drink alcohol.  If you are breastfeeding, limit how much and how often you drink alcohol.  Limit alcohol intake to no more than 1 drink per day for nonpregnant women. One drink equals 12 ounces of beer, 5 ounces of wine, or 1 ounces of hard liquor.  Do not use street drugs.  Do not share needles.  Ask your health care provider for  help if you need support or information about quitting drugs.  Tell your health care provider if you often feel depressed.  Tell your health care provider if you have ever been abused or do not feel safe at home. This information is not intended to replace advice given to you by your health care provider. Make sure you discuss any questions you have with your health care provider. Document Released: 12/17/2010 Document Revised: 11/09/2015 Document Reviewed: 03/07/2015  2017 Elsevier  Uterine Fibroids Uterine fibroids are tissue masses (tumors) that can develop in the womb (uterus). They are also called leiomyomas. This type of tumor is not cancerous (benign) and does not spread to other parts of the body outside of the pelvic area, which is between the hip bones. Occasionally, fibroids may develop in the fallopian tubes, in the cervix, or on the support structures (ligaments) that surround the uterus. You can have one or many fibroids. Fibroids can vary in size, weight, and where they grow in the uterus. Some can become quite large. Most fibroids do not require medical treatment. What are the causes? A fibroid can develop when a single uterine cell keeps growing (replicating). Most cells in the human body have a control mechanism that keeps them from replicating without control. What are the signs or symptoms? Symptoms may include:  Heavy bleeding during your period.  Bleeding or spotting between periods.  Pelvic pain and pressure.  Bladder problems, such as needing to urinate more often (urinary frequency) or urgently.  Inability to reproduce offspring (infertility).  Miscarriages. How is this diagnosed? Uterine fibroids are diagnosed through a physical exam. Your health care provider may feel the lumpy tumors during a pelvic exam. Ultrasonography and an MRI may be done to determine the size, location, and number of fibroids. How is this treated? Treatment may include:  Watchful  waiting. This involves getting the fibroid checked by your health care provider to see if it grows or shrinks. Follow your health care provider's recommendations for how often to have this checked.  Hormone medicines. These can be taken by mouth or given through an intrauterine device (IUD).  Surgery.  Removing the fibroids (myomectomy) or the uterus (hysterectomy).  Removing blood supply to the fibroids (uterine artery embolization). If fibroids interfere with your fertility and you want to become pregnant, your health care provider may recommend having the fibroids removed. Follow these instructions at home:  Keep all follow-up visits as directed by your health care provider. This is important.  Take over-the-counter and prescription medicines only as told by your health care provider.  If you were prescribed a hormone treatment, take the hormone medicines exactly as directed.  Ask your health care provider about taking iron pills and increasing the amount of dark green, leafy vegetables in your diet. These actions can help to boost your blood iron levels, which may be affected by heavy menstrual bleeding.  Pay close attention to your period and tell your health care provider about any changes, such as:  Increased blood flow that requires you to use more pads or tampons than usual per month.  A change in the number of days that your period lasts per month.  A change in symptoms that are associated with your period, such as abdominal cramping or back pain. Contact a health care provider if:  You have pelvic pain, back pain, or abdominal cramps that cannot be controlled with medicines.  You have an increase in bleeding between and during periods.  You soak tampons or pads in a half hour or less.  You feel lightheaded, extra tired, or weak. Get help right away if:  You faint.  You have a sudden increase in pelvic pain. This information is not intended to replace advice given  to you by your health care provider. Make sure you discuss any questions you have with your health care provider. Document Released: 05/31/2000 Document Revised: 02/01/2016 Document Reviewed: 11/30/2013 Elsevier Interactive Patient Education  2017 [place] DASH stands for "Dietary Approaches to Stop Hypertension." The DASH eating plan is a healthy eating plan that has been shown to reduce high blood pressure (hypertension). Additional health benefits may include reducing the risk of type 2 diabetes mellitus, heart disease, and stroke. The DASH eating plan may also help with weight loss. What do I need to know about the DASH eating plan? For the DASH eating plan, you will follow these general guidelines:  Choose foods with less than 150 milligrams of sodium per serving (as listed on the food label).  Use salt-free seasonings or herbs instead of table salt or sea salt.  Check with your health care provider or pharmacist before using salt substitutes.  Eat lower-sodium products. These are often labeled as "low-sodium" or "no salt added."  Eat fresh foods. Avoid eating a lot of canned foods.  Eat more vegetables, fruits, and low-fat dairy products.  Choose whole grains. Look for the word "whole" as the first word in the ingredient list.  Choose fish and skinless chicken or [place] more often than red meat. Limit fish, poultry, and meat to 6 oz (170 g) each day.  Limit sweets, desserts, sugars, and sugary drinks.  Choose heart-healthy fats.  Eat more home-cooked food and less restaurant, buffet, and fast food.  Limit fried foods.  Do not fry foods. Cook foods using methods such as baking, boiling, grilling, and broiling instead.  When eating at a restaurant, ask that your food be prepared with less salt, or no salt if possible. What foods can I eat?  Seek help from a dietitian for individual calorie needs. Grains  Whole grain or whole wheat bread. Brown rice.  Whole grain or whole wheat pasta. Quinoa, bulgur, and whole grain cereals. Low-sodium cereals. Corn or whole wheat flour tortillas. Whole grain cornbread. Whole grain crackers. Low-sodium crackers. Vegetables  Fresh or frozen vegetables (raw, steamed, roasted, or grilled). Low-sodium or reduced-sodium tomato and vegetable juices. Low-sodium or reduced-sodium tomato sauce and paste. Low-sodium or reduced-sodium canned vegetables. Fruits  All fresh, canned (in natural juice), or frozen fruits. Meat and Other Protein Products  Ground beef (85% or leaner), grass-fed beef, or beef trimmed of fat. Skinless chicken or [place]. Ground chicken or [place]. Pork trimmed of fat. All fish and seafood. Eggs. Dried beans, peas, or lentils. Unsalted nuts and seeds. Unsalted canned beans. Dairy  Low-fat dairy products, such as skim or 1% milk, 2% or reduced-fat cheeses, low-fat ricotta or cottage cheese, or plain low-fat yogurt. Low-sodium or reduced-sodium cheeses. Fats and Oils  Tub margarines without trans fats. Light or reduced-fat mayonnaise and salad dressings (reduced sodium). Avocado. Safflower, olive, or canola oils. Natural peanut or almond butter. Other  Unsalted popcorn and pretzels. The items listed above may not be a complete list of recommended foods or beverages. Contact your dietitian for more options.  What foods are not recommended? Grains  White bread. White pasta. White rice. Refined cornbread. Bagels and croissants. Crackers that contain trans fat. Vegetables  Creamed or fried vegetables. Vegetables in a cheese sauce. Regular canned vegetables. Regular canned tomato sauce and paste. Regular tomato and vegetable juices. Fruits  Canned fruit in light or heavy syrup. Fruit juice. Meat and Other Protein Products  Fatty cuts of meat. Ribs, chicken wings, bacon, sausage, bologna, salami, chitterlings, fatback, hot dogs, bratwurst, and packaged luncheon meats. Salted nuts and seeds. Canned beans  with salt. Dairy  Whole or 2% milk, cream, half-and-half, and cream cheese. Whole-fat or sweetened yogurt. Full-fat cheeses or blue cheese. Nondairy creamers and whipped toppings. Processed cheese, cheese spreads, or cheese curds. Condiments  Onion and garlic salt, seasoned salt, table salt, and sea salt. Canned and packaged gravies. Worcestershire sauce. Tartar sauce. Barbecue sauce. Teriyaki sauce. Soy sauce, including reduced sodium. Steak sauce. Fish sauce. Oyster sauce. Cocktail sauce. Horseradish. Ketchup and mustard. Meat flavorings and tenderizers. Bouillon cubes. Hot sauce. Tabasco sauce. Marinades. Taco seasonings. Relishes. Fats and Oils  Butter, stick margarine, lard, shortening, ghee, and bacon fat. Coconut, palm kernel, or palm oils. Regular salad dressings. Other  Pickles and olives. Salted popcorn and pretzels. The items listed above may not be a complete list of foods and beverages to avoid. Contact your dietitian for more information.  Where can I find more information? National Heart, Lung, and Blood Institute: travelstabloid.com This information is not intended to replace advice given to you by your health care provider. Make sure you discuss any questions you have with your health care provider. Document Released: 05/23/2011 Document Revised: 11/09/2015 Document Reviewed: 04/07/2013 Elsevier Interactive Patient Education  2017 [employer].

## 2016-05-23 NOTE — Assessment & Plan Note (Signed)
Elevated today because she's on prednisone. Monitor at home. Call with any concerns.

## 2016-05-23 NOTE — Progress Notes (Signed)
BP (!) 148/78 (BP Location: Left Arm, Patient Position: Sitting, Cuff Size: Normal)   Pulse 88   Temp 98.6 F (37 C)   Ht 5' 4.2" (1.631 m)   Wt 161 lb 6.4 oz (73.2 kg)   SpO2 99%   BMI 27.53 kg/m    Subjective:    Patient ID: Nichole Gordon, female    DOB: 02-03-1966, 50 y.o.   MRN: [id number]  HPI: Nichole Gordon is a 50 y.o. female presenting on 05/23/2016 for comprehensive medical examination. Current medical complaints include:  HYPERTENSION Hypertension status: controlled  Satisfied with current treatment? yes Duration of hypertension: chronic BP monitoring frequency:  not checking BP medication side effects:  no Medication compliance: excellent compliance Aspirin: no Recurrent headaches: no Visual changes: no Palpitations: no Dyspnea: no Chest pain: no Lower extremity edema: no Dizzy/lightheaded: no  HYPOTHYROIDISM Thyroid control status:controlled Satisfied with current treatment? yes Medication side effects: no Medication compliance: excellent compliance Recent dose adjustment:no Fatigue: no Cold intolerance: no Heat intolerance: no Weight gain: no Weight loss: no Constipation: no Diarrhea/loose stools: no Palpitations: no Lower extremity edema: no Anxiety/depressed mood: no  She currently lives with: husband Menopausal Symptoms: no  Depression Screen done today and results listed below:  Depression screen [facility] 2/9 05/23/2016 05/23/2015  Decreased Interest 0 0  Down, Depressed, Hopeless 0 0  PHQ - 2 Score 0 0  Altered sleeping - 0  Tired, decreased energy - 1  Change in appetite - 0  Feeling bad or failure about yourself  - 0  Trouble concentrating - 0  Moving slowly or fidgety/restless - 0  Suicidal thoughts - 0  PHQ-9 Score - 1  Difficult doing work/chores - Not difficult at all    Past Medical History:  Past Medical History:  Diagnosis Date  . Anxiety   . Fibrocystic breast disease   . Hypertension   . Palpitations     Surgical  History:  Past Surgical History:  Procedure Laterality Date  . LITHOTRIPSY  2012    Medications:  Current Outpatient Prescriptions on File Prior to Visit  Medication Sig  . Cholecalciferol (VITAMIN D) 2000 UNITS CAPS Take by mouth.    . fluticasone (FLONASE) 50 MCG/ACT nasal spray Place 1 spray into both nostrils 2 (two) times daily.   Marland Kitchen levothyroxine (SYNTHROID, LEVOTHROID) 25 MCG tablet Take 1 tablet (25 mcg total) by mouth daily.  Marland Kitchen liothyronine (CYTOMEL) 5 MCG tablet Take 1 tablet (5 mcg total) by mouth daily.  . Multiple Vitamin (MULTIVITAMIN) capsule Take 1 capsule by mouth daily.     Current Facility-Administered Medications on File Prior to Visit  Medication  . triamcinolone acetonide (KENALOG) 10 MG/ML injection 10 mg    Allergies:  Allergies  Allergen Reactions  . Amoxicillin Rash  . Omnipaque [Iohexol] Shortness Of Breath  . Phenytoin Sodium Extended Swelling  . Iodinated Diagnostic Agents Itching  . Trileptal [Oxcarbazepine] Swelling    Social History:  Social History   Social History  . Marital status: Married    Spouse name: N/A  . Number of children: N/A  . Years of education: N/A   Occupational History  . Not on file.   Social History Main Topics  . Smoking status: Never Smoker  . Smokeless tobacco: Never Used  . Alcohol use No  . Drug use: No  . Sexual activity: Not on file   Other Topics Concern  . Not on file   Social History Narrative  . No narrative on  file   History  Smoking Status  . Never Smoker  Smokeless Tobacco  . Never Used   History  Alcohol Use No    Family History:  Family History  Problem Relation Age of Onset  . Cancer Father   . Hyperlipidemia Mother   . Hypertension Mother   . Arthritis Mother   . Hypertension Brother     Past medical history, surgical history, medications, allergies, family history and social history reviewed with patient today and changes made to appropriate areas of the chart.   Review  of Systems  Constitutional: Negative.  Negative for chills, diaphoresis, fever, malaise/fatigue and weight loss.  HENT: Positive for congestion and ear pain. Negative for ear discharge, hearing loss, nosebleeds, sinus pain, sore throat and tinnitus.   Eyes: Negative.   Respiratory: Negative.  Negative for stridor.   Cardiovascular: Negative.   Gastrointestinal: Positive for heartburn (usually with caffiene). Negative for abdominal pain, blood in stool, constipation, diarrhea, melena, nausea and vomiting.  Genitourinary: Negative.   Musculoskeletal: Positive for myalgias. Negative for back pain, falls, joint pain and neck pain.  Skin: Negative.   Neurological: Negative.  Negative for weakness.  Endo/Heme/Allergies: Negative.   Psychiatric/Behavioral: Negative.     All other ROS negative except what is listed above and in the HPI.      Objective:    BP (!) 148/78 (BP Location: Left Arm, Patient Position: Sitting, Cuff Size: Normal)   Pulse 88   Temp 98.6 F (37 C)   Ht 5' 4.2" (1.631 m)   Wt 161 lb 6.4 oz (73.2 kg)   SpO2 99%   BMI 27.53 kg/m   Wt Readings from Last 3 Encounters:  05/23/16 161 lb 6.4 oz (73.2 kg)  11/21/15 163 lb (73.9 kg)  05/23/15 156 lb (70.8 kg)    Physical Exam  Constitutional: She is oriented to person, place, and time. She appears well-developed and well-nourished. No distress.  HENT:  Head: Normocephalic and atraumatic.  Right Ear: Hearing, tympanic membrane, external ear and ear canal normal.  Left Ear: Hearing, tympanic membrane, external ear and ear canal normal.  Nose: Nose normal.  Mouth/Throat: Uvula is midline, oropharynx is clear and moist and mucous membranes are normal. No oropharyngeal exudate.  Eyes: Conjunctivae, EOM and lids are normal. Pupils are equal, round, and reactive to light. Right eye exhibits no discharge. Left eye exhibits no discharge. No scleral icterus.  Neck: Normal range of motion. Neck supple. No JVD present. No tracheal  deviation present. No thyromegaly present.  Cardiovascular: Normal rate, regular rhythm, normal heart sounds and intact distal pulses.  Exam reveals no gallop and no friction rub.   No murmur heard. Pulmonary/Chest: Effort normal and breath sounds normal. No stridor. No respiratory distress. She has no wheezes. She has no rales. She exhibits no tenderness. Right breast exhibits no inverted nipple, no mass, no nipple discharge, no skin change and no tenderness. Left breast exhibits no inverted nipple, no mass, no nipple discharge, no skin change and no tenderness. Breasts are symmetrical.  Abdominal: Soft. Bowel sounds are normal. She exhibits no distension and no mass. There is no tenderness. There is no rebound and no guarding.  Genitourinary:  Genitourinary Comments: GYN exam deferred with shared decision making  Musculoskeletal: Normal range of motion. She exhibits no edema, tenderness or deformity.  Lymphadenopathy:    She has no cervical adenopathy.  Neurological: She is alert and oriented to person, place, and time. She has normal reflexes. She displays normal  reflexes. No cranial nerve deficit. She exhibits normal muscle tone. Coordination normal.  Skin: Skin is warm, dry and intact. No rash noted. She is not diaphoretic. No erythema. No pallor.  Psychiatric: She has a normal mood and affect. Her speech is normal and behavior is normal. Judgment and thought content normal. Cognition and memory are normal.  Nursing note and vitals reviewed.   Results for orders placed or performed in visit on 05/23/15  TSH  Result Value Ref Range   TSH 1.710 0.450 - 4.500 uIU/mL  Lipid Profile  Result Value Ref Range   Cholesterol, Total 206 (H) 100 - 199 mg/dL   Triglycerides 74 0 - 149 mg/dL   HDL 58 >39 mg/dL   VLDL Cholesterol Cal 15 5 - 40 mg/dL   LDL Calculated 133 (H) 0 - 99 mg/dL   Chol/HDL Ratio 3.6 0.0 - 4.4 ratio units  CBC w/Diff  Result Value Ref Range   WBC 6.6 3.4 - 10.8 x10E3/uL    RBC 4.53 3.77 - 5.28 x10E6/uL   Hemoglobin 13.2 11.1 - 15.9 g/dL   Hematocrit 39.7 34.0 - 46.6 %   MCV 88 79 - 97 fL   MCH 29.1 26.6 - 33.0 pg   MCHC 33.2 31.5 - 35.7 g/dL   RDW 14.1 12.3 - 15.4 %   Platelets 252 150 - 379 x10E3/uL   Neutrophils 58 %   Lymphs 35 %   Monocytes 6 %   Eos 1 %   Basos 0 %   Neutrophils Absolute 3.9 1.4 - 7.0 x10E3/uL   Lymphocytes Absolute 2.3 0.7 - 3.1 x10E3/uL   Monocytes Absolute 0.4 0.1 - 0.9 x10E3/uL   EOS (ABSOLUTE) 0.1 0.0 - 0.4 x10E3/uL   Basophils Absolute 0.0 0.0 - 0.2 x10E3/uL   Immature Granulocytes 0 %   Immature Grans (Abs) 0.0 0.0 - 0.1 x10E3/uL      Assessment & Plan:   Problem List Items Addressed This Visit      Cardiovascular and Mediastinum   Hypertension    Elevated today because she's on prednisone. Monitor at home. Call with any concerns.       Relevant Orders   Comprehensive metabolic panel     Endocrine   Hypothyroidism (Chronic)    Rechecking levels today. Adjust dose as needed. Call with any concerns.       Relevant Orders   Comprehensive metabolic panel   TSH     Genitourinary   Kidney stones    No issues today. Call with any concerns.       Relevant Orders   CBC with Differential/Platelet   Comprehensive metabolic panel   UA/M w/rflx Culture, Routine     Other   Anxiety    Stable. Continue current regimen. Continue to monitor.       Relevant Medications   escitalopram (LEXAPRO) 20 MG tablet    Other Visit Diagnoses    Routine general medical examination at a health care facility    -  Primary   Up to date on vaccines. Screening labs checked today. Pap up to date. Mammogram up to date. Colonoscopy up to date. Continue diet and exercise.    Relevant Orders   CBC with Differential/Platelet   Comprehensive metabolic panel   Lipid Panel w/o Chol/HDL Ratio   TSH   UA/M w/rflx Culture, Routine   Screening for cholesterol level       Labs checked today   Relevant Orders   Lipid Panel w/o  Chol/HDL Ratio  Encounter for hepatitis C screening test for low risk patient       Labs checked today   Relevant Orders   Hepatitis C antibody   Screening for HIV (human immunodeficiency virus)       Labs checked today   Relevant Orders   HIV antibody   Screening for breast cancer       Mammogram ordered today   Relevant Orders   MM DIGITAL SCREENING BILATERAL       Follow up plan: Return in about 6 months (around 11/21/2016) for Mood/BP.   LABORATORY TESTING:  - Pap smear: up to date  IMMUNIZATIONS:   - Tdap: Tetanus vaccination status reviewed: last tetanus booster within 10 years. - Influenza: Up to date  SCREENING: -Mammogram: Up to date  - Colonoscopy: Up to date  - Bone Density: Not applicable  -Hearing Test: Not applicable  -Spirometry: Not applicable   PATIENT COUNSELING:   Advised to take 1 mg of folate supplement per day if capable of pregnancy.   Sexuality: Discussed sexually transmitted diseases, partner selection, use of condoms, avoidance of unintended pregnancy  and contraceptive alternatives.   Advised to avoid cigarette smoking.  I discussed with the patient that most people either abstain from alcohol or drink within safe limits (<=14/week and <=4 drinks/occasion for males, <=7/weeks and <= 3 drinks/occasion for females) and that the risk for alcohol disorders and other health effects rises proportionally with the number of drinks per week and how often a drinker exceeds daily limits.  Discussed cessation/primary prevention of drug use and availability of treatment for abuse.   Diet: Encouraged to adjust caloric intake to maintain  or achieve ideal body weight, to reduce intake of dietary saturated fat and total fat, to limit sodium intake by avoiding high sodium foods and not adding table salt, and to maintain adequate dietary potassium and calcium preferably from fresh fruits, vegetables, and low-fat dairy products.    stressed the importance of  regular exercise  Injury prevention: Discussed safety belts, safety helmets, smoke detector, smoking near bedding or upholstery.   Dental health: Discussed importance of regular tooth brushing, flossing, and dental visits.    NEXT PREVENTATIVE PHYSICAL DUE IN 1 YEAR. Return in about 6 months (around 11/21/2016) for Mood/BP.

## 2016-05-24 LAB — LIPID PANEL W/O CHOL/HDL RATIO
Cholesterol, Total: 237 mg/dL — ABNORMAL HIGH (ref 100–199)
HDL: 83 mg/dL (ref >39)
LDL CALC: 141 mg/dL — AB (ref 0–99)
TRIGLYCERIDES: 64 mg/dL (ref 0–149)
VLDL CHOLESTEROL CAL: 13 mg/dL (ref 5–40)

## 2016-05-24 LAB — CBC WITH DIFFERENTIAL/PLATELET
Basophils Absolute: 0 10*3/uL (ref 0.0–0.2)
Basos: 0 %
EOS (ABSOLUTE): 0 10*3/uL (ref 0.0–0.4)
EOS: 0 %
HEMATOCRIT: 41 % (ref 34.0–46.6)
Hemoglobin: 13.6 g/dL (ref 11.1–15.9)
IMMATURE GRANULOCYTES: 1 %
Immature Grans (Abs): 0.1 10*3/uL (ref 0.0–0.1)
Lymphocytes Absolute: 2.3 10*3/uL (ref 0.7–3.1)
Lymphs: 19 %
MCH: 28.8 pg (ref 26.6–33.0)
MCHC: 33.2 g/dL (ref 31.5–35.7)
MCV: 87 fL (ref 79–97)
MONOS ABS: 0.6 10*3/uL (ref 0.1–0.9)
Monocytes: 5 %
NEUTROS PCT: 75 %
Neutrophils Absolute: 9.7 10*3/uL — ABNORMAL HIGH (ref 1.4–7.0)
PLATELETS: 306 10*3/uL (ref 150–379)
RBC: 4.72 x10E6/uL (ref 3.77–5.28)
RDW: 14.4 % (ref 12.3–15.4)
WBC: 12.7 10*3/uL — AB (ref 3.4–10.8)

## 2016-05-24 LAB — COMPREHENSIVE METABOLIC PANEL
ALT: 12 IU/L (ref 0–32)
AST: 23 IU/L (ref 0–40)
Albumin/Globulin Ratio: 1.7 (ref 1.2–2.2)
Albumin: 4.8 g/dL (ref 3.5–5.5)
Alkaline Phosphatase: 92 IU/L (ref 39–117)
BUN/Creatinine Ratio: 18 (ref 9–23)
BUN: 10 mg/dL (ref 6–24)
Bilirubin Total: 0.4 mg/dL (ref 0.0–1.2)
CALCIUM: 9.8 mg/dL (ref 8.7–10.2)
CO2: 23 mmol/L (ref 18–29)
CREATININE: 0.57 mg/dL (ref 0.57–1.00)
Chloride: 101 mmol/L (ref 96–106)
GFR calc Af Amer: 123 mL/min/{1.73_m2} (ref >59)
GFR, EST NON AFRICAN AMERICAN: 107 mL/min/{1.73_m2} (ref >59)
GLOBULIN, TOTAL: 2.8 g/dL (ref 1.5–4.5)
Glucose: 95 mg/dL (ref 65–99)
Potassium: 4.3 mmol/L (ref 3.5–5.2)
Sodium: 143 mmol/L (ref 134–144)
Total Protein: 7.6 g/dL (ref 6.0–8.5)

## 2016-05-24 LAB — TSH: TSH: 1.18 u[IU]/mL (ref 0.450–4.500)

## 2016-05-24 LAB — HEPATITIS C ANTIBODY: Hep C Virus Ab: <0.1 s/co ratio (ref 0.0–0.9)

## 2016-05-24 LAB — HIV ANTIBODY (ROUTINE TESTING W REFLEX): HIV Screen 4th Generation wRfx: NONREACTIVE

## 2016-05-27 ENCOUNTER — Other Ambulatory Visit: Payer: Self-pay | Admitting: Family Medicine

## 2016-05-27 MED ORDER — LEVOTHYROXINE SODIUM 25 MCG PO TABS: 25.0000 ug | ORAL_TABLET | Freq: Every day | ORAL | 4 refills | Status: DC

## 2016-05-27 MED ORDER — LIOTHYRONINE SODIUM 5 MCG PO TABS: 5.0000 ug | ORAL_TABLET | Freq: Every day | ORAL | 4 refills | Status: DC

## 2016-08-08 ENCOUNTER — Ambulatory Visit
Admission: RE | Admit: 2016-08-08 | Discharge: 2016-08-08 | Disposition: A | Discharge status: Home / Self Care | Payer: 59 | Source: Ambulatory Visit | Attending: Family Medicine | Admitting: Family Medicine

## 2016-08-08 DIAGNOSIS — Z1239 Encounter for other screening for malignant neoplasm of breast: Secondary | ICD-10-CM

## 2016-08-08 DIAGNOSIS — Z1231 Encounter for screening mammogram for malignant neoplasm of breast: Secondary | ICD-10-CM | POA: Insufficient documentation

## 2016-08-08 DIAGNOSIS — R928 Other abnormal and inconclusive findings on diagnostic imaging of breast: Secondary | ICD-10-CM | POA: Insufficient documentation

## 2016-08-09 ENCOUNTER — Other Ambulatory Visit: Payer: Self-pay | Admitting: Family Medicine

## 2016-08-09 DIAGNOSIS — N6320 Unspecified lump in the left breast, unspecified quadrant: Secondary | ICD-10-CM

## 2016-08-09 DIAGNOSIS — R928 Other abnormal and inconclusive findings on diagnostic imaging of breast: Secondary | ICD-10-CM

## 2016-08-15 ENCOUNTER — Other Ambulatory Visit: Payer: Self-pay | Admitting: Family Medicine

## 2016-08-15 ENCOUNTER — Ambulatory Visit
Admission: RE | Admit: 2016-08-15 | Discharge: 2016-08-15 | Disposition: A | Discharge status: Home / Self Care | Payer: 59 | Source: Ambulatory Visit | Attending: Family Medicine | Admitting: Family Medicine

## 2016-08-15 DIAGNOSIS — N6012 Diffuse cystic mastopathy of left breast: Secondary | ICD-10-CM

## 2016-08-15 DIAGNOSIS — N6489 Other specified disorders of breast: Secondary | ICD-10-CM | POA: Diagnosis not present

## 2016-08-15 DIAGNOSIS — R922 Inconclusive mammogram: Secondary | ICD-10-CM | POA: Diagnosis not present

## 2016-08-15 DIAGNOSIS — R928 Other abnormal and inconclusive findings on diagnostic imaging of breast: Secondary | ICD-10-CM

## 2016-08-15 DIAGNOSIS — N6320 Unspecified lump in the left breast, unspecified quadrant: Secondary | ICD-10-CM | POA: Diagnosis present

## 2016-11-21 ENCOUNTER — Ambulatory Visit: Payer: 59 | Admitting: Family Medicine

## 2016-12-01 ENCOUNTER — Other Ambulatory Visit: Payer: Self-pay | Admitting: Family Medicine

## 2016-12-06 ENCOUNTER — Ambulatory Visit (INDEPENDENT_AMBULATORY_CARE_PROVIDER_SITE_OTHER): Payer: 59 | Admitting: Family Medicine

## 2016-12-06 ENCOUNTER — Encounter: Payer: Self-pay | Admitting: Family Medicine

## 2016-12-06 VITALS — BP 137/84 | HR 90 | Temp 98.7°F | Wt 151.2 lb

## 2016-12-06 DIAGNOSIS — E039 Hypothyroidism, unspecified: Secondary | ICD-10-CM

## 2016-12-06 DIAGNOSIS — I10 Essential (primary) hypertension: Secondary | ICD-10-CM | POA: Diagnosis not present

## 2016-12-06 DIAGNOSIS — F419 Anxiety disorder, unspecified: Secondary | ICD-10-CM | POA: Diagnosis not present

## 2016-12-06 NOTE — Assessment & Plan Note (Signed)
Under good control. Continue current regimen. Call with any concerns.  

## 2016-12-06 NOTE — Assessment & Plan Note (Signed)
Under good control off medicine. Continue diet and exercise. Call with any concerns.

## 2016-12-06 NOTE — Progress Notes (Signed)
BP 137/84 (BP Location: Left Arm, Patient Position: Sitting, Cuff Size: Normal)   Pulse 90   Temp 98.7 F (37.1 C)   Wt 151 lb 4 oz (68.6 kg)   LMP 11/22/2016 (Approximate)   SpO2 97%   BMI 25.80 kg/m    Subjective:    Patient ID: Nichole Gordon, female    DOB: 1966-04-24, 51 y.o.   MRN: [id number]  HPI: Nichole Gordon is a 51 y.o. female  Chief Complaint  Patient presents with  . Hypertension  . Anxiety   HYPERTENSION Hypertension status: controlled  Satisfied with current treatment? yes Duration of hypertension: chronic BP monitoring frequency:  not checking BP medication side effects:  no Medication compliance: excellent compliance Aspirin: no Recurrent headaches: no Visual changes: no Palpitations: no Dyspnea: no Chest pain: no Lower extremity edema: no Dizzy/lightheaded: no  ANXIETY/STRESS Duration:controlled Anxious mood: no  Excessive worrying: no Irritability: no  Sweating: no Nausea: no Palpitations:no Hyperventilation: no Panic attacks: no Agoraphobia: no  Obscessions/compulsions: no Depressed mood: no Depression screen [facility] 2/9 12/06/2016 05/23/2016 05/23/2015  Decreased Interest 0 0 0  Down, Depressed, Hopeless 0 0 0  PHQ - 2 Score 0 0 0  Altered sleeping - - 0  Tired, decreased energy - - 1  Change in appetite - - 0  Feeling bad or failure about yourself  - - 0  Trouble concentrating - - 0  Moving slowly or fidgety/restless - - 0  Suicidal thoughts - - 0  PHQ-9 Score - - 1  Difficult doing work/chores - - Not difficult at all   Anhedonia: no Weight changes: no Insomnia: no   Hypersomnia: no Fatigue/loss of energy: no Feelings of worthlessness: no Feelings of guilt: no Impaired concentration/indecisiveness: no Suicidal ideations: no  Crying spells: no Recent Stressors/Life Changes: no  HYPOTHYROIDISM Thyroid control status:controlled Satisfied with current treatment? yes Medication side effects: no Medication compliance:  excellent compliance Recent dose adjustment:no Fatigue: no Cold intolerance: no Heat intolerance: no Weight gain: no Weight loss: no Constipation: no Diarrhea/loose stools: no Palpitations: no Lower extremity edema: no Anxiety/depressed mood: no  Relevant past medical, surgical, family and social history reviewed and updated as indicated. Interim medical history since our last visit reviewed. Allergies and medications reviewed and updated.  Review of Systems  Constitutional: Negative.   Respiratory: Negative.   Cardiovascular: Negative.   Psychiatric/Behavioral: Negative.     Per HPI unless specifically indicated above     Objective:    BP 137/84 (BP Location: Left Arm, Patient Position: Sitting, Cuff Size: Normal)   Pulse 90   Temp 98.7 F (37.1 C)   Wt 151 lb 4 oz (68.6 kg)   LMP 11/22/2016 (Approximate)   SpO2 97%   BMI 25.80 kg/m   Wt Readings from Last 3 Encounters:  12/06/16 151 lb 4 oz (68.6 kg)  05/23/16 161 lb 6.4 oz (73.2 kg)  11/21/15 163 lb (73.9 kg)    Physical Exam  Constitutional: She is oriented to person, place, and time. She appears well-developed and well-nourished. No distress.  HENT:  Head: Normocephalic and atraumatic.  Right Ear: Hearing normal.  Left Ear: Hearing normal.  Nose: Nose normal.  Eyes: Conjunctivae and lids are normal. Right eye exhibits no discharge. Left eye exhibits no discharge. No scleral icterus.  Cardiovascular: Normal rate, regular rhythm, normal heart sounds and intact distal pulses.  Exam reveals no gallop and no friction rub.   No murmur heard. Pulmonary/Chest: Effort normal and breath sounds normal.  No respiratory distress. She has no wheezes. She has no rales. She exhibits no tenderness.  Musculoskeletal: Normal range of motion.  Neurological: She is alert and oriented to person, place, and time.  Skin: Skin is warm, dry and intact. No rash noted. She is not diaphoretic. No erythema. No pallor.  Psychiatric: She  has a normal mood and affect. Her speech is normal and behavior is normal. Judgment and thought content normal. Cognition and memory are normal.  Nursing note and vitals reviewed.   Results for orders placed or performed in visit on 05/23/16  Microscopic Examination  Result Value Ref Range   WBC, UA None seen 0 - 5 /hpf   RBC, UA 0-2 0 - 2 /hpf   Epithelial Cells (non renal) 0-10 0 - 10 /hpf   Bacteria, UA Few (A) None seen/Few  CBC with Differential/Platelet  Result Value Ref Range   WBC 12.7 (H) 3.4 - 10.8 x10E3/uL   RBC 4.72 3.77 - 5.28 x10E6/uL   Hemoglobin 13.6 11.1 - 15.9 g/dL   Hematocrit 41.0 34.0 - 46.6 %   MCV 87 79 - 97 fL   MCH 28.8 26.6 - 33.0 pg   MCHC 33.2 31.5 - 35.7 g/dL   RDW 14.4 12.3 - 15.4 %   Platelets 306 150 - 379 x10E3/uL   Neutrophils 75 Not Estab. %   Lymphs 19 Not Estab. %   Monocytes 5 Not Estab. %   Eos 0 Not Estab. %   Basos 0 Not Estab. %   Neutrophils Absolute 9.7 (H) 1.4 - 7.0 x10E3/uL   Lymphocytes Absolute 2.3 0.7 - 3.1 x10E3/uL   Monocytes Absolute 0.6 0.1 - 0.9 x10E3/uL   EOS (ABSOLUTE) 0.0 0.0 - 0.4 x10E3/uL   Basophils Absolute 0.0 0.0 - 0.2 x10E3/uL   Immature Granulocytes 1 Not Estab. %   Immature Grans (Abs) 0.1 0.0 - 0.1 x10E3/uL  Comprehensive metabolic panel  Result Value Ref Range   Glucose 95 65 - 99 mg/dL   BUN 10 6 - 24 mg/dL   Creatinine, Ser 0.57 0.57 - 1.00 mg/dL   GFR calc non Af Amer 107 >59 mL/min/1.73   GFR calc Af Amer 123 >59 mL/min/1.73   BUN/Creatinine Ratio 18 9 - 23   Sodium 143 134 - 144 mmol/L   Potassium 4.3 3.5 - 5.2 mmol/L   Chloride 101 96 - 106 mmol/L   CO2 23 18 - 29 mmol/L   Calcium 9.8 8.7 - 10.2 mg/dL   Total Protein 7.6 6.0 - 8.5 g/dL   Albumin 4.8 3.5 - 5.5 g/dL   Globulin, Total 2.8 1.5 - 4.5 g/dL   Albumin/Globulin Ratio 1.7 1.2 - 2.2   Bilirubin Total 0.4 0.0 - 1.2 mg/dL   Alkaline Phosphatase 92 39 - 117 IU/L   AST 23 0 - 40 IU/L   ALT 12 0 - 32 IU/L  Lipid Panel w/o Chol/HDL Ratio    Result Value Ref Range   Cholesterol, Total 237 (H) 100 - 199 mg/dL   Triglycerides 64 0 - 149 mg/dL   HDL 83 >39 mg/dL   VLDL Cholesterol Cal 13 5 - 40 mg/dL   LDL Calculated 141 (H) 0 - 99 mg/dL  TSH  Result Value Ref Range   TSH 1.180 0.450 - 4.500 uIU/mL  UA/M w/rflx Culture, Routine  Result Value Ref Range   Specific Gravity, UA 1.025 1.005 - 1.030   pH, UA 6.0 5.0 - 7.5   Color, UA Yellow Yellow  Appearance Ur Clear Clear   Leukocytes, UA Negative Negative   Protein, UA Negative Negative/Trace   Glucose, UA Negative Negative   Ketones, UA Negative Negative   RBC, UA Negative Negative   Bilirubin, UA Negative Negative   Urobilinogen, Ur 0.2 0.2 - 1.0 mg/dL   Nitrite, UA Negative Negative   Microscopic Examination See below:   HIV antibody  Result Value Ref Range   HIV Screen 4th Generation wRfx Non Reactive Non Reactive  Hepatitis C antibody  Result Value Ref Range   Hep C Virus Ab <0.1 0.0 - 0.9 s/co ratio      Assessment & Plan:   Problem List Items Addressed This Visit      Cardiovascular and Mediastinum   Hypertension - Primary    Under good control off medicine. Continue diet and exercise. Call with any concerns.       Relevant Orders   Basic metabolic panel     Endocrine   Hypothyroidism (Chronic)    Under good control. Continue current regimen. Call with any concerns.       Relevant Orders   TSH     Other   Anxiety    Under good control. Continue current regimen. Call with any concerns.           Follow up plan: Return in about 6 months (around 06/07/2017) for Physical .

## 2016-12-07 LAB — BASIC METABOLIC PANEL
BUN/Creatinine Ratio: 27 — ABNORMAL HIGH (ref 9–23)
BUN: 16 mg/dL (ref 6–24)
CALCIUM: 9.8 mg/dL (ref 8.7–10.2)
CHLORIDE: 101 mmol/L (ref 96–106)
CO2: 24 mmol/L (ref 20–29)
Creatinine, Ser: 0.59 mg/dL (ref 0.57–1.00)
GFR calc non Af Amer: 105 mL/min/{1.73_m2} (ref >59)
GFR, EST AFRICAN AMERICAN: 121 mL/min/{1.73_m2} (ref >59)
Glucose: 71 mg/dL (ref 65–99)
POTASSIUM: 4.7 mmol/L (ref 3.5–5.2)
Sodium: 140 mmol/L (ref 134–144)

## 2016-12-07 LAB — TSH: TSH: 1.08 u[IU]/mL (ref 0.450–4.500)

## 2017-03-11 ENCOUNTER — Telehealth: Payer: Self-pay | Admitting: Family Medicine

## 2017-03-11 DIAGNOSIS — N63 Unspecified lump in unspecified breast: Secondary | ICD-10-CM

## 2017-03-11 NOTE — Telephone Encounter (Signed)
-----   Message from [place] sent at 03/11/2017 11:51 AM EDT ----- Regarding: Orders Good morning,    This patient needs to follow up on her left breast for masses found on her last mammogram. Will you please enter correct orders for her? She needs:  [id number] Left breast diagnostic tomo/3D mammo (all diagnostic mammos should be ordered as Tomo/3D unless its a unilateral follow up for Calcifications found on the breast)  [id number] Left breast limited ultrasound.  (all breast ultrasounds should be ordered as limited)  If you have any questions, please contact me at [phone]. Thank you and have a great day!

## 2017-04-04 DIAGNOSIS — Z23 Encounter for immunization: Secondary | ICD-10-CM | POA: Diagnosis not present

## 2017-04-24 ENCOUNTER — Encounter: Payer: Self-pay | Admitting: Family Medicine

## 2017-04-24 ENCOUNTER — Ambulatory Visit
Admission: RE | Admit: 2017-04-24 | Discharge: 2017-04-24 | Disposition: A | Discharge status: Home / Self Care | Payer: 59 | Source: Ambulatory Visit | Attending: Family Medicine | Admitting: Family Medicine

## 2017-04-24 DIAGNOSIS — N63 Unspecified lump in unspecified breast: Secondary | ICD-10-CM

## 2017-04-24 DIAGNOSIS — N6489 Other specified disorders of breast: Secondary | ICD-10-CM | POA: Diagnosis not present

## 2017-04-24 DIAGNOSIS — R922 Inconclusive mammogram: Secondary | ICD-10-CM | POA: Diagnosis not present

## 2017-04-24 LAB — HM MAMMOGRAPHY

## 2017-05-12 DIAGNOSIS — H43811 Vitreous degeneration, right eye: Secondary | ICD-10-CM | POA: Diagnosis not present

## 2017-05-27 ENCOUNTER — Encounter: Payer: Self-pay | Admitting: Family Medicine

## 2017-06-02 ENCOUNTER — Other Ambulatory Visit: Payer: Self-pay | Admitting: Family Medicine

## 2017-06-12 ENCOUNTER — Encounter: Payer: 59 | Admitting: Family Medicine

## 2017-06-23 DIAGNOSIS — H43811 Vitreous degeneration, right eye: Secondary | ICD-10-CM | POA: Diagnosis not present

## 2017-07-02 ENCOUNTER — Other Ambulatory Visit: Payer: Self-pay | Admitting: Family Medicine

## 2017-07-02 NOTE — Telephone Encounter (Signed)
Attempted to pt at [phone]; left message on voice mail; per provider's note dated 12/06/16 pt to return in 6 months for physical; no upcoming appointments noted.

## 2017-07-25 ENCOUNTER — Encounter: Payer: Self-pay | Admitting: Family Medicine

## 2017-07-25 ENCOUNTER — Other Ambulatory Visit: Payer: Self-pay | Admitting: Family Medicine

## 2017-07-25 DIAGNOSIS — Z1239 Encounter for other screening for malignant neoplasm of breast: Secondary | ICD-10-CM

## 2017-08-06 ENCOUNTER — Other Ambulatory Visit: Payer: Self-pay | Admitting: Family Medicine

## 2017-08-12 ENCOUNTER — Encounter: Payer: 59 | Admitting: Family Medicine

## 2017-08-26 ENCOUNTER — Ambulatory Visit (INDEPENDENT_AMBULATORY_CARE_PROVIDER_SITE_OTHER): Payer: 59 | Admitting: Family Medicine

## 2017-08-26 ENCOUNTER — Encounter: Payer: Self-pay | Admitting: Family Medicine

## 2017-08-26 VITALS — BP 136/80 | HR 87 | Temp 98.4°F | Ht 63.0 in | Wt 160.0 lb

## 2017-08-26 DIAGNOSIS — Z0000 Encounter for general adult medical examination without abnormal findings: Secondary | ICD-10-CM | POA: Diagnosis not present

## 2017-08-26 DIAGNOSIS — Z0001 Encounter for general adult medical examination with abnormal findings: Secondary | ICD-10-CM | POA: Diagnosis not present

## 2017-08-26 DIAGNOSIS — Z8041 Family history of malignant neoplasm of ovary: Secondary | ICD-10-CM | POA: Diagnosis not present

## 2017-08-26 DIAGNOSIS — F419 Anxiety disorder, unspecified: Secondary | ICD-10-CM | POA: Diagnosis not present

## 2017-08-26 DIAGNOSIS — Z124 Encounter for screening for malignant neoplasm of cervix: Secondary | ICD-10-CM | POA: Diagnosis not present

## 2017-08-26 DIAGNOSIS — Z13220 Encounter for screening for lipoid disorders: Secondary | ICD-10-CM | POA: Diagnosis not present

## 2017-08-26 DIAGNOSIS — E039 Hypothyroidism, unspecified: Secondary | ICD-10-CM

## 2017-08-26 DIAGNOSIS — I10 Essential (primary) hypertension: Secondary | ICD-10-CM

## 2017-08-26 DIAGNOSIS — N200 Calculus of kidney: Secondary | ICD-10-CM

## 2017-08-26 LAB — UA/M W/RFLX CULTURE, ROUTINE
BILIRUBIN UA: NEGATIVE
GLUCOSE, UA: NEGATIVE
Ketones, UA: NEGATIVE
Leukocytes, UA: NEGATIVE
Nitrite, UA: NEGATIVE
PROTEIN UA: NEGATIVE
RBC, UA: NEGATIVE
Specific Gravity, UA: 1.01 (ref 1.005–1.030)
Urobilinogen, Ur: 0.2 mg/dL (ref 0.2–1.0)
pH, UA: 6 (ref 5.0–7.5)

## 2017-08-26 LAB — MICROALBUMIN, URINE WAIVED
CREATININE, URINE WAIVED: 50 mg/dL (ref 10–300)
MICROALB, UR WAIVED: 10 mg/L (ref 0–19)
Microalb/Creat Ratio: <30 mg/g (ref <30)

## 2017-08-26 NOTE — Assessment & Plan Note (Signed)
Negative for genetic markers. No need for prophylactic oophorectomy. Continue to monitor.

## 2017-08-26 NOTE — Assessment & Plan Note (Signed)
Under good control. Rechecking levels today, will adjust dose as needed. Call with any concerns.

## 2017-08-26 NOTE — Progress Notes (Signed)
Blood pressure 136/80, pulse 87, temperature 98.4 F (36.9 C), temperature source Oral, height 5\' 3"  (1.6 m), weight 160 lb (72.6 kg), SpO2 99 %.  Subjective:    Patient ID: Nichole Gordon, female    DOB: 1966-03-17, 52 y.o.   MRN: [id number]  HPI: Nichole Gordon is a 52 y.o. female presenting on 08/26/2017 for comprehensive medical examination. Current medical complaints include:  HYPERTENSION Hypertension status: controlled  Satisfied with current treatment? yes Duration of hypertension: chronic BP monitoring frequency:  not checking BP medication side effects:  no Medication compliance: excellent compliance Previous BP meds: none Aspirin: no Recurrent headaches: no Visual changes: no Palpitations: no Dyspnea: no Chest pain: no Lower extremity edema: no Dizzy/lightheaded: no  HYPOTHYROIDISM Thyroid control status:controlled Satisfied with current treatment? yes Medication side effects: no Medication compliance: excellent compliance Etiology of hypothyroidism:  Recent dose adjustment:no Fatigue: no Cold intolerance: no Heat intolerance: no Weight gain: no Weight loss: no Constipation: no Diarrhea/loose stools: no Palpitations: no Lower extremity edema: no Anxiety/depressed mood: no  ANXIETY/STRESS Duration:controlled Anxious mood: yes  Excessive worrying: no Irritability: no  Sweating: no Nausea: no Palpitations:no Hyperventilation: no Panic attacks: no Agoraphobia: no  Obscessions/compulsions: no Depressed mood: no Depression screen [facility] 2/9 08/26/2017 12/06/2016 05/23/2016 05/23/2015  Decreased Interest 0 0 0 0  Down, Depressed, Hopeless 0 0 0 0  PHQ - 2 Score 0 0 0 0  Altered sleeping 0 - - 0  Tired, decreased energy 1 - - 1  Change in appetite 0 - - 0  Feeling bad or failure about yourself  0 - - 0  Trouble concentrating 0 - - 0  Moving slowly or fidgety/restless 0 - - 0  Suicidal thoughts 0 - - 0  PHQ-9 Score 1 - - 1  Difficult doing  work/chores Not difficult at all - - Not difficult at all   Anhedonia: no Weight changes: no Insomnia: no   Hypersomnia: no Fatigue/loss of energy: no Feelings of worthlessness: no Feelings of guilt: no Impaired concentration/indecisiveness: no Suicidal ideations: no  Crying spells: no Recent Stressors/Life Changes: yes   Relationship problems: no   Family stress: yes     Financial stress: no    Job stress: no    Recent death/loss: no  She currently lives with: husband- kids in college Menopausal Symptoms: yes- night sweats  Depression Screen done today and results listed below:  Depression screen [facility] 2/9 12/06/2016 05/23/2016 05/23/2015  Decreased Interest 0 0 0  Down, Depressed, Hopeless 0 0 0  PHQ - 2 Score 0 0 0  Altered sleeping - - 0  Tired, decreased energy - - 1  Change in appetite - - 0  Feeling bad or failure about yourself  - - 0  Trouble concentrating - - 0  Moving slowly or fidgety/restless - - 0  Suicidal thoughts - - 0  PHQ-9 Score - - 1  Difficult doing work/chores - - Not difficult at all    Past Medical History:  Past Medical History:  Diagnosis Date  . Anxiety   . Fibrocystic breast disease   . Hypertension   . Palpitations     Surgical History:  Past Surgical History:  Procedure Laterality Date  . LITHOTRIPSY  2012    Medications:  Current Outpatient Medications on File Prior to Visit  Medication Sig  . Cholecalciferol (VITAMIN D) 2000 UNITS CAPS Take by mouth.    . escitalopram (LEXAPRO) 20 MG tablet TAKE 1 TABLET (20 MG TOTAL)  BY MOUTH DAILY.  . fluticasone (FLONASE) 50 MCG/ACT nasal spray Place 1 spray into both nostrils 2 (two) times daily.   Marland Kitchen levothyroxine (SYNTHROID, LEVOTHROID) 25 MCG tablet TAKE 1 TABLET BY MOUTH EVERY DAY  . liothyronine (CYTOMEL) 5 MCG tablet Take 1 tablet (5 mcg total) by mouth daily.  . Multiple Vitamin (MULTIVITAMIN) capsule Take 1 capsule by mouth daily.     No current facility-administered medications on  file prior to visit.     Allergies:  Allergies  Allergen Reactions  . Amoxicillin Rash  . Omnipaque [Iohexol] Shortness Of Breath  . Phenytoin Sodium Extended Swelling  . Iodinated Diagnostic Agents Itching  . Trileptal [Oxcarbazepine] Swelling    Social History:  Social History   Socioeconomic History  . Marital status: Married    Spouse name: Not on file  . Number of children: Not on file  . Years of education: Not on file  . Highest education level: Not on file  Social Needs  . Financial resource strain: Not on file  . Food insecurity - worry: Not on file  . Food insecurity - inability: Not on file  . Transportation needs - medical: Not on file  . Transportation needs - non-medical: Not on file  Occupational History  . Not on file  Tobacco Use  . Smoking status: Never Smoker  . Smokeless tobacco: Never Used  Substance and Sexual Activity  . Alcohol use: No  . Drug use: No  . Sexual activity: Yes    Birth control/protection: None  Other Topics Concern  . Not on file  Social History Narrative  . Not on file   Social History   Tobacco Use  Smoking Status Never Smoker  Smokeless Tobacco Never Used   Social History   Substance and Sexual Activity  Alcohol Use No    Family History:  Family History  Problem Relation Age of Onset  . Cancer Father   . Hyperlipidemia Mother   . Hypertension Mother   . Arthritis Mother   . Hypertension Brother   . Breast cancer Paternal Aunt 20  . Breast cancer Paternal Aunt 3    Past medical history, surgical history, medications, allergies, family history and social history reviewed with patient today and changes made to appropriate areas of the chart.   Review of Systems  Constitutional: Negative.   HENT: Negative.   Eyes: Negative.   Respiratory: Negative.   Cardiovascular: Positive for palpitations. Negative for chest pain, orthopnea, claudication, leg swelling and PND.  Gastrointestinal: Positive for heartburn  (with food choices). Negative for abdominal pain, blood in stool, constipation, diarrhea, melena, nausea and vomiting.  Genitourinary: Negative.   Musculoskeletal: Positive for joint pain (tennis elbow on the R). Negative for back pain, falls, myalgias and neck pain.  Skin: Negative.   Neurological: Negative.   Endo/Heme/Allergies: Negative.   Psychiatric/Behavioral: Negative.     All other ROS negative except what is listed above and in the HPI.      Objective:    BP (!) 173/99   Pulse (!) 101   Temp 98.4 F (36.9 C) (Oral)   Ht 5\' 3"  (1.6 m)   Wt 160 lb (72.6 kg)   SpO2 99%   BMI 28.34 kg/m    Wt Readings from Last 3 Encounters:  08/26/17 160 lb (72.6 kg)  12/06/16 151 lb 4 oz (68.6 kg)  05/23/16 161 lb 6.4 oz (73.2 kg)    Physical Exam  Constitutional: She is oriented to person, place, and  time. She appears well-developed and well-nourished. No distress.  HENT:  Head: Normocephalic and atraumatic.  Right Ear: Hearing and external ear normal.  Left Ear: Hearing and external ear normal.  Nose: Nose normal.  Mouth/Throat: Oropharynx is clear and moist. No oropharyngeal exudate.  Eyes: Conjunctivae, EOM and lids are normal. Pupils are equal, round, and reactive to light. Right eye exhibits no discharge. Left eye exhibits no discharge. No scleral icterus.  Neck: Normal range of motion. No JVD present. No tracheal deviation present. No thyromegaly present.  Cardiovascular: Normal rate, regular rhythm, normal heart sounds and intact distal pulses. Exam reveals no gallop and no friction rub.  No murmur heard. Pulmonary/Chest: Effort normal. No stridor. No respiratory distress. She has no wheezes. She has no rales. She exhibits no tenderness. Right breast exhibits no inverted nipple, no mass, no nipple discharge, no skin change and no tenderness. Left breast exhibits no inverted nipple, no mass, no nipple discharge, no skin change and no tenderness. Breasts are symmetrical.    Abdominal: Bowel sounds are normal. She exhibits no distension and no mass. There is no tenderness. There is no rebound and no guarding. Hernia confirmed negative in the right inguinal area and confirmed negative in the left inguinal area.  Genitourinary: Vagina normal and uterus normal. No labial fusion. There is no rash, tenderness, lesion or injury on the right labia. There is no rash, tenderness, lesion or injury on the left labia. Uterus is not deviated, not enlarged, not fixed and not tender. Cervix exhibits no motion tenderness, no discharge and no friability. Right adnexum displays no mass, no tenderness and no fullness. Left adnexum displays no mass, no tenderness and no fullness. No erythema, tenderness or bleeding in the vagina. No foreign body in the vagina. No signs of injury around the vagina. No vaginal discharge found.  Musculoskeletal: Normal range of motion. She exhibits no edema, tenderness or deformity.  Lymphadenopathy:    She has no cervical adenopathy.  Neurological: She is alert and oriented to person, place, and time. She has normal reflexes. She displays normal reflexes. No cranial nerve deficit. She exhibits normal muscle tone. Coordination normal.  Skin: Skin is warm, dry and intact. No rash noted. She is not diaphoretic. No erythema. No pallor.  Psychiatric: She has a normal mood and affect. Her speech is normal and behavior is normal. Judgment and thought content normal. Cognition and memory are normal.  Nursing note and vitals reviewed.   Results for orders placed or performed in visit on 04/24/17  [facility] MAMMOGRAPHY  Result Value Ref Range   HM Mammogram 0-4 Bi-Rad 0-4 Bi-Rad, Self Reported Normal      Assessment & Plan:   Problem List Items Addressed This Visit      Cardiovascular and Mediastinum   Hypertension    Under good control on recheck. Continue current regimen. Continue to monitor. Call with any concerns. Refills given today.      Relevant Orders    CBC with Differential/Platelet   Comprehensive metabolic panel   Microalbumin, Urine Waived     Endocrine   Hypothyroidism (Chronic)    Under good control. Rechecking levels today, will adjust dose as needed. Call with any concerns.       Relevant Orders   CBC with Differential/Platelet   Comprehensive metabolic panel   TSH     Genitourinary   Kidney stones    Has not had an issue. Will check urine. Call with any concerns. Continue to stay hydrated.  Relevant Orders   CBC with Differential/Platelet   Comprehensive metabolic panel   UA/M w/rflx Culture, Routine     Other   Anxiety    Under good control. Continue current regimen. Continue to monitor. Call with any concerns.       Relevant Orders   CBC with Differential/Platelet   Comprehensive metabolic panel    Other Visit Diagnoses    Routine general medical examination at a health care facility    -  Primary   Vaccines up to date. Screening labs checked today. Pap done. Mammogram ordered. Colonoscopy up to date. Call with any concerns. Continue diet and exercise.    Relevant Orders   CBC with Differential/Platelet   Comprehensive metabolic panel   Lipid Panel w/o Chol/HDL Ratio   Microalbumin, Urine Waived   TSH   UA/M w/rflx Culture, Routine   Screening for cholesterol level       Labs drawn today.  Await results. call with any concerns.    Relevant Orders   Lipid Panel w/o Chol/HDL Ratio   Screening for cervical cancer       Pap done today.   Relevant Orders   IGP, Aptima HPV, rfx 16/18,45       Follow up plan: Return in about 6 months (around 02/26/2018) for Follow up.   LABORATORY TESTING:  - Pap smear: pap done  IMMUNIZATIONS:   - Tdap: Tetanus vaccination status reviewed: last tetanus booster within 10 years. - Influenza: Up to date  SCREENING: -Mammogram: Up to date  - Colonoscopy: Up to date    PATIENT COUNSELING:   Advised to take 1 mg of folate supplement per day if capable of  pregnancy.   Sexuality: Discussed sexually transmitted diseases, partner selection, use of condoms, avoidance of unintended pregnancy  and contraceptive alternatives.   Advised to avoid cigarette smoking.  I discussed with the patient that most people either abstain from alcohol or drink within safe limits (<=14/week and <=4 drinks/occasion for males, <=7/weeks and <= 3 drinks/occasion for females) and that the risk for alcohol disorders and other health effects rises proportionally with the number of drinks per week and how often a drinker exceeds daily limits.  Discussed cessation/primary prevention of drug use and availability of treatment for abuse.   Diet: Encouraged to adjust caloric intake to maintain  or achieve ideal body weight, to reduce intake of dietary saturated fat and total fat, to limit sodium intake by avoiding high sodium foods and not adding table salt, and to maintain adequate dietary potassium and calcium preferably from fresh fruits, vegetables, and low-fat dairy products.    stressed the importance of regular exercise  Injury prevention: Discussed safety belts, safety helmets, smoke detector, smoking near bedding or upholstery.   Dental health: Discussed importance of regular tooth brushing, flossing, and dental visits.    NEXT PREVENTATIVE PHYSICAL DUE IN 1 YEAR. Return in about 6 months (around 02/26/2018) for Follow up.

## 2017-08-26 NOTE — Patient Instructions (Addendum)
Tennis Elbow Rehab  Ask your health care provider which exercises are safe for you. Do exercises exactly as told by your health care provider and adjust them as directed. It is normal to feel mild stretching, pulling, tightness, or discomfort as you do these exercises, but you should stop right away if you feel sudden pain or your pain gets worse. Do not begin these exercises until told by your health care provider.  Stretching and range of motion exercises  These exercises warm up your muscles and joints and improve the movement and flexibility of your elbow. These exercises also help to relieve pain, numbness, and tingling.  Exercise A: Wrist extensor stretch  1. Extend your left / right elbow with your fingers pointing down.  2. Gently pull the palm of your left / right hand toward you until you feel a gentle stretch on the top of your forearm.  3. To increase the stretch, push your left / right hand toward the outer edge or pinkie side of your forearm.  4. Hold this position for __________ seconds.  Repeat __________ times. Complete this exercise __________ times a day.  If directed by your health care provider, repeat this stretch except do it with a bent elbow this time.  Exercise B: Wrist flexor stretch    1. Extend your left / right elbow and turn your palm upward.  2. Gently pull your left / right palm and fingertips back so your wrist extends and your fingers point more toward the ground.  3. You should feel a gentle stretch on the inside of your forearm.  4. Hold this position for __________ seconds.  Repeat __________ times. Complete this exercise __________ times a day.  If directed by your health care provider, repeat this stretch except do it with a bent elbow this time.  Strengthening exercises  These exercises build strength and endurance in your elbow. Endurance is the ability to use your muscles for a long time, even after they get tired.  Exercise C: Wrist extensors    1. Sit with your left /  right forearm palm-down and fully supported on a table or countertop. Your elbow should be resting below the height of your shoulder.  2. Let your left / right wrist extend over the edge of the surface.  3. Loosely hold a __________ weight or a piece of rubber exercise band or tubing in your left / right hand. Slowly curl your left / right hand up toward your forearm. If you are using band or tubing, hold the band or tubing in place with your other hand to provide resistance.  4. Hold this position for __________ seconds.  5. Slowly return to the starting position.  Repeat __________ times. Complete this exercise __________ times a day.  Exercise D: Radial deviators    1. Stand with a __________ weight in your left / righthand. Or, sit while holding a rubber exercise band or tubing with your other arm supported on a table or countertop. Position your hand so your thumb is on top.  2. Raise your hand upward in front of you so your thumb travels toward your forearm, or pull up on the rubber tubing.  3. Hold this position for __________ seconds.  4. Slowly return to the starting position.  Repeat __________ times. Complete this exercise __________ times a day.  Exercise E: Eccentric wrist extensors  1. Sit with your left / right forearm palm-down and fully supported on a table or countertop. Your   elbow should be resting below the height of your shoulder.  2. If told by your health care provider, hold a __________ weight in your hand.  3. Let your left / right wrist extend over the edge of the surface.  4. Use your other hand to lift up your left / right hand toward your forearm. Keep your forearm on the table.  5. Using only the muscles in your left / right hand, slowly lower your hand back down to the starting position.  Repeat __________ times. Complete this exercise __________ times a day.  This information is not intended to replace advice given to you by your health care provider. Make sure you discuss any  questions you have with your health care provider.  Document Released: 06/03/2005 Document Revised: 02/07/2016 Document Reviewed: 03/02/2015  [employer] Interactive Patient Education  2018 [employer]

## 2017-08-26 NOTE — Assessment & Plan Note (Addendum)
Under good control on recheck. Continue current regimen. Continue to monitor. Call with any concerns. Refills given today.

## 2017-08-26 NOTE — Assessment & Plan Note (Signed)
Has not had an issue. Will check urine. Call with any concerns. Continue to stay hydrated.

## 2017-08-26 NOTE — Assessment & Plan Note (Signed)
Under good control. Continue current regimen. Continue to monitor. Call with any concerns. 

## 2017-08-27 ENCOUNTER — Other Ambulatory Visit: Payer: Self-pay | Admitting: Family Medicine

## 2017-08-27 LAB — CBC WITH DIFFERENTIAL/PLATELET
BASOS: 0 %
Basophils Absolute: 0 10*3/uL (ref 0.0–0.2)
EOS (ABSOLUTE): 0.1 10*3/uL (ref 0.0–0.4)
EOS: 1 %
HEMATOCRIT: 37.6 % (ref 34.0–46.6)
HEMOGLOBIN: 12.8 g/dL (ref 11.1–15.9)
Immature Grans (Abs): 0 10*3/uL (ref 0.0–0.1)
Immature Granulocytes: 0 %
LYMPHS ABS: 2.6 10*3/uL (ref 0.7–3.1)
Lymphs: 29 %
MCH: 29 pg (ref 26.6–33.0)
MCHC: 34 g/dL (ref 31.5–35.7)
MCV: 85 fL (ref 79–97)
MONOCYTES: 7 %
MONOS ABS: 0.6 10*3/uL (ref 0.1–0.9)
NEUTROS ABS: 5.8 10*3/uL (ref 1.4–7.0)
Neutrophils: 63 %
Platelets: 291 10*3/uL (ref 150–379)
RBC: 4.41 x10E6/uL (ref 3.77–5.28)
RDW: 14.4 % (ref 12.3–15.4)
WBC: 9.1 10*3/uL (ref 3.4–10.8)

## 2017-08-27 LAB — COMPREHENSIVE METABOLIC PANEL
ALBUMIN: 4.6 g/dL (ref 3.5–5.5)
ALK PHOS: 82 IU/L (ref 39–117)
ALT: 17 IU/L (ref 0–32)
AST: 24 IU/L (ref 0–40)
Albumin/Globulin Ratio: 2 (ref 1.2–2.2)
BUN / CREAT RATIO: 23 (ref 9–23)
BUN: 11 mg/dL (ref 6–24)
Bilirubin Total: 0.2 mg/dL (ref 0.0–1.2)
CO2: 24 mmol/L (ref 20–29)
CREATININE: 0.48 mg/dL — AB (ref 0.57–1.00)
Calcium: 9.5 mg/dL (ref 8.7–10.2)
Chloride: 98 mmol/L (ref 96–106)
GFR calc Af Amer: 130 mL/min/{1.73_m2} (ref >59)
GFR calc non Af Amer: 112 mL/min/{1.73_m2} (ref >59)
GLOBULIN, TOTAL: 2.3 g/dL (ref 1.5–4.5)
Glucose: 103 mg/dL — ABNORMAL HIGH (ref 65–99)
POTASSIUM: 4 mmol/L (ref 3.5–5.2)
Sodium: 138 mmol/L (ref 134–144)
Total Protein: 6.9 g/dL (ref 6.0–8.5)

## 2017-08-27 LAB — LIPID PANEL W/O CHOL/HDL RATIO
CHOLESTEROL TOTAL: 205 mg/dL — AB (ref 100–199)
HDL: 72 mg/dL (ref >39)
LDL CALC: 116 mg/dL — AB (ref 0–99)
TRIGLYCERIDES: 87 mg/dL (ref 0–149)
VLDL Cholesterol Cal: 17 mg/dL (ref 5–40)

## 2017-08-27 LAB — TSH: TSH: 1.58 u[IU]/mL (ref 0.450–4.500)

## 2017-08-27 MED ORDER — LEVOTHYROXINE SODIUM 25 MCG PO TABS: 25.0000 ug | ORAL_TABLET | Freq: Every day | ORAL | 1 refills | Status: DC

## 2017-08-29 LAB — IGP, APTIMA HPV, RFX 16/18,45
HPV Aptima: NEGATIVE
PAP SMEAR COMMENT: 0

## 2017-09-02 ENCOUNTER — Ambulatory Visit
Admission: RE | Admit: 2017-09-02 | Discharge: 2017-09-02 | Disposition: A | Discharge status: Home / Self Care | Payer: 59 | Source: Ambulatory Visit | Attending: Family Medicine | Admitting: Family Medicine

## 2017-09-02 DIAGNOSIS — R922 Inconclusive mammogram: Secondary | ICD-10-CM | POA: Diagnosis not present

## 2017-09-02 DIAGNOSIS — Z1239 Encounter for other screening for malignant neoplasm of breast: Secondary | ICD-10-CM

## 2017-09-02 DIAGNOSIS — Z1231 Encounter for screening mammogram for malignant neoplasm of breast: Secondary | ICD-10-CM | POA: Insufficient documentation

## 2017-09-02 DIAGNOSIS — N6324 Unspecified lump in the left breast, lower inner quadrant: Secondary | ICD-10-CM | POA: Diagnosis not present

## 2017-11-17 DIAGNOSIS — L089 Local infection of the skin and subcutaneous tissue, unspecified: Secondary | ICD-10-CM | POA: Diagnosis not present

## 2017-11-17 DIAGNOSIS — S61210A Laceration without foreign body of right index finger without damage to nail, initial encounter: Secondary | ICD-10-CM | POA: Diagnosis not present

## 2017-11-29 ENCOUNTER — Other Ambulatory Visit: Payer: Self-pay | Admitting: Family Medicine

## 2018-02-26 ENCOUNTER — Encounter: Payer: Self-pay | Admitting: Family Medicine

## 2018-02-26 ENCOUNTER — Ambulatory Visit (INDEPENDENT_AMBULATORY_CARE_PROVIDER_SITE_OTHER): Payer: 59 | Admitting: Family Medicine

## 2018-02-26 VITALS — BP 158/91 | HR 84 | Wt 160.7 lb

## 2018-02-26 DIAGNOSIS — F419 Anxiety disorder, unspecified: Secondary | ICD-10-CM

## 2018-02-26 DIAGNOSIS — E039 Hypothyroidism, unspecified: Secondary | ICD-10-CM

## 2018-02-26 DIAGNOSIS — I10 Essential (primary) hypertension: Secondary | ICD-10-CM | POA: Diagnosis not present

## 2018-02-26 DIAGNOSIS — Z13220 Encounter for screening for lipoid disorders: Secondary | ICD-10-CM

## 2018-02-26 DIAGNOSIS — Z1211 Encounter for screening for malignant neoplasm of colon: Secondary | ICD-10-CM | POA: Diagnosis not present

## 2018-02-26 DIAGNOSIS — Z0000 Encounter for general adult medical examination without abnormal findings: Secondary | ICD-10-CM | POA: Diagnosis not present

## 2018-02-26 LAB — UA/M W/RFLX CULTURE, ROUTINE
BILIRUBIN UA: NEGATIVE
GLUCOSE, UA: NEGATIVE
Ketones, UA: NEGATIVE
Leukocytes, UA: NEGATIVE
Nitrite, UA: NEGATIVE
PROTEIN UA: NEGATIVE
RBC, UA: NEGATIVE
SPEC GRAV UA: 1.015 (ref 1.005–1.030)
Urobilinogen, Ur: 1 mg/dL (ref 0.2–1.0)
pH, UA: 6.5 (ref 5.0–7.5)

## 2018-02-26 LAB — MICROALBUMIN, URINE WAIVED
CREATININE, URINE WAIVED: 100 mg/dL (ref 10–300)
MICROALB, UR WAIVED: 30 mg/L — AB (ref 0–19)
Microalb/Creat Ratio: <30 mg/g (ref <30)

## 2018-02-26 MED ORDER — LISINOPRIL 5 MG PO TABS
5.0000 mg | ORAL_TABLET | Freq: Every day | ORAL | 3 refills | Status: DC
Start: 2018-02-26 — End: 2018-04-03

## 2018-02-26 MED ORDER — ESCITALOPRAM OXALATE 20 MG PO TABS: 20.0000 mg | ORAL_TABLET | Freq: Every day | ORAL | 1 refills | Status: DC

## 2018-02-26 NOTE — Assessment & Plan Note (Signed)
Rechecking levels today. Await results. Call with any concerns.  

## 2018-02-26 NOTE — Assessment & Plan Note (Signed)
Under good control on current regimen. Continue current regimen. Continue to monitor. Call with any concerns. Refills given.   

## 2018-02-26 NOTE — Assessment & Plan Note (Signed)
Not under good control, will start 5mg  lisinopril and recheck in 4-6 weeks.

## 2018-02-26 NOTE — Progress Notes (Signed)
BP (!) 158/91   Pulse 84   Wt 160 lb 11.2 oz (72.9 kg)   SpO2 96%   BMI 28.47 kg/m    Subjective:    Patient ID: Nichole Gordon, female    DOB: [birth year], 52 y.o.   MRN: [id number]  HPI: NOELLA KIPNIS is a 52 y.o. female presenting on 02/26/2018 for 6 month follow uo. Current medical complaints include:  HYPERTENSION Hypertension status: stable  Satisfied with current treatment? yes Duration of hypertension: chronic BP monitoring frequency:  not checking BP medication side effects:  no Medication compliance: excellent compliance Previous BP meds: none Aspirin: no Recurrent headaches: no Visual changes: no Palpitations: no Dyspnea: no Chest pain: no Lower extremity edema: no Dizzy/lightheaded: no  HYPOTHYROIDISM Thyroid control status:stable Satisfied with current treatment? yes Medication side effects: no Medication compliance: excellent compliance Recent dose adjustment:no Fatigue: no Cold intolerance: no Heat intolerance: no Weight gain: no Weight loss: no Constipation: no Diarrhea/loose stools: no Palpitations: no Lower extremity edema: no Anxiety/depressed mood: no  ANXIETY/STRESS- mom's cancer is back, taking chemo Duration:exacerbated Anxious mood: yes  Excessive worrying: yes Irritability: no  Sweating: no Nausea: no Palpitations:no Hyperventilation: no Panic attacks: no Agoraphobia: no  Obscessions/compulsions: no Depressed mood: no Depression screen [facility] 2/9 02/26/2018 02/26/2018 08/26/2017 12/06/2016 05/23/2016  Decreased Interest 0 0 0 0 0  Down, Depressed, Hopeless 0 0 0 0 0  PHQ - 2 Score 0 0 0 0 0  Altered sleeping 0 - 0 - -  Tired, decreased energy 1 - 1 - -  Change in appetite 0 - 0 - -  Feeling bad or failure about yourself  0 - 0 - -  Trouble concentrating 0 - 0 - -  Moving slowly or fidgety/restless 0 - 0 - -  Suicidal thoughts 0 - 0 - -  PHQ-9 Score 1 - 1 - -  Difficult doing work/chores Not difficult at all - Not difficult at  all - -   GAD 7 : Generalized Anxiety Score 02/26/2018 12/06/2016 11/21/2015  Nervous, Anxious, on Edge 0 0 0  Control/stop worrying 0 0 0  Worry too much - different things 0 0 0  Trouble relaxing 0 0 0  Restless 0 0 0  Easily annoyed or irritable 0 0 0  Afraid - awful might happen 0 0 0  Total GAD 7 Score 0 0 0  Anxiety Difficulty Not difficult at all - Not difficult at all   Anhedonia: no Weight changes: no Insomnia: no   Hypersomnia: no Fatigue/loss of energy: yes Feelings of worthlessness: no Feelings of guilt: no Impaired concentration/indecisiveness: no Suicidal ideations: no  Crying spells: no Recent Stressors/Life Changes: no   Relationship problems: no   Family stress: yes     Financial stress: no    Job stress: no    Recent death/loss: no  She currently lives with: husband Menopausal Symptoms: no  Depression Screen done today and results listed below:  Depression screen [facility] 2/9 02/26/2018 02/26/2018 08/26/2017 12/06/2016 05/23/2016  Decreased Interest 0 0 0 0 0  Down, Depressed, Hopeless 0 0 0 0 0  PHQ - 2 Score 0 0 0 0 0  Altered sleeping 0 - 0 - -  Tired, decreased energy 1 - 1 - -  Change in appetite 0 - 0 - -  Feeling bad or failure about yourself  0 - 0 - -  Trouble concentrating 0 - 0 - -  Moving slowly or fidgety/restless 0 - 0 - -  Suicidal thoughts 0 - 0 - -  PHQ-9 Score 1 - 1 - -  Difficult doing work/chores Not difficult at all - Not difficult at all - -     Past Medical History:  Past Medical History:  Diagnosis Date  . Anxiety   . Fibrocystic breast disease   . Hypertension   . Palpitations     Surgical History:  Past Surgical History:  Procedure Laterality Date  . LITHOTRIPSY  2012    Medications:  Current Outpatient Medications on File Prior to Visit  Medication Sig  . Cholecalciferol (VITAMIN D) 2000 UNITS CAPS Take by mouth.    . fluticasone (FLONASE) 50 MCG/ACT nasal spray Place 1 spray into both nostrils 2 (two) times daily.     Marland Kitchen levothyroxine (SYNTHROID, LEVOTHROID) 25 MCG tablet Take 1 tablet (25 mcg total) by mouth daily.  Marland Kitchen liothyronine (CYTOMEL) 5 MCG tablet Take 1 tablet (5 mcg total) by mouth daily.  . Multiple Vitamin (MULTIVITAMIN) capsule Take 1 capsule by mouth daily.     No current facility-administered medications on file prior to visit.     Allergies:  Allergies  Allergen Reactions  . Amoxicillin Rash  . Omnipaque [Iohexol] Shortness Of Breath  . Phenytoin Sodium Extended Swelling  . Iodinated Diagnostic Agents Itching  . Trileptal [Oxcarbazepine] Swelling    Social History:  Social History   Socioeconomic History  . Marital status: Married    Spouse name: Not on file  . Number of children: Not on file  . Years of education: Not on file  . Highest education level: Not on file  Occupational History  . Not on file  Social Needs  . Financial resource strain: Not on file  . Food insecurity:    Worry: Not on file    Inability: Not on file  . Transportation needs:    Medical: Not on file    Non-medical: Not on file  Tobacco Use  . Smoking status: Never Smoker  . Smokeless tobacco: Never Used  Substance and Sexual Activity  . Alcohol use: No  . Drug use: No  . Sexual activity: Yes    Birth control/protection: None  Lifestyle  . Physical activity:    Days per week: Not on file    Minutes per session: Not on file  . Stress: Not on file  Relationships  . Social connections:    Talks on phone: Not on file    Gets together: Not on file    Attends religious service: Not on file    Active member of club or organization: Not on file    Attends meetings of clubs or organizations: Not on file    Relationship status: Not on file  . Intimate partner violence:    Fear of current or ex partner: Not on file    Emotionally abused: Not on file    Physically abused: Not on file    Forced sexual activity: Not on file  Other Topics Concern  . Not on file  Social History Narrative  . Not  on file   Social History   Tobacco Use  Smoking Status Never Smoker  Smokeless Tobacco Never Used   Social History   Substance and Sexual Activity  Alcohol Use No    Family History:  Family History  Problem Relation Age of Onset  . Cancer Father   . Hyperlipidemia Mother   . Hypertension Mother   . Arthritis Mother   . Cancer Mother  ovarian cancer  . Hypertension Brother   . Breast cancer Paternal Aunt 41  . Breast cancer Paternal Aunt 14    Past medical history, surgical history, medications, allergies, family history and social history reviewed with patient today and changes made to appropriate areas of the chart.   Review of Systems  Constitutional: Negative.   HENT: Negative.   Eyes: Negative.   Respiratory: Negative.   Cardiovascular: Negative.   Gastrointestinal: Negative.   Genitourinary: Negative.   Musculoskeletal: Negative.   Skin: Negative.   Neurological: Negative.   Endo/Heme/Allergies: Negative.   Psychiatric/Behavioral: Negative for depression, hallucinations, memory loss, substance abuse and suicidal ideas. The patient is nervous/anxious. The patient does not have insomnia.     All other ROS negative except what is listed above and in the HPI.      Objective:    BP (!) 158/91   Pulse 84   Wt 160 lb 11.2 oz (72.9 kg)   SpO2 96%   BMI 28.47 kg/m   Wt Readings from Last 3 Encounters:  02/26/18 160 lb 11.2 oz (72.9 kg)  08/26/17 160 lb (72.6 kg)  12/06/16 151 lb 4 oz (68.6 kg)    Physical Exam  Constitutional: She is oriented to person, place, and time. She appears well-developed and well-nourished. No distress.  HENT:  Head: Normocephalic and atraumatic.  Right Ear: Hearing, tympanic membrane, external ear and ear canal normal.  Left Ear: Hearing, tympanic membrane, external ear and ear canal normal.  Nose: Nose normal.  Mouth/Throat: Uvula is midline, oropharynx is clear and moist and mucous membranes are normal. No  oropharyngeal exudate.  Eyes: Pupils are equal, round, and reactive to light. Conjunctivae and lids are normal. Right eye exhibits no discharge. Left eye exhibits no discharge. No scleral icterus.  Neck: Normal range of motion. Neck supple. No JVD present. No tracheal deviation present. No thyromegaly present.  Cardiovascular: Normal rate, regular rhythm, normal heart sounds and intact distal pulses. Exam reveals no gallop and no friction rub.  No murmur heard. Pulmonary/Chest: Effort normal and breath sounds normal. No stridor. No respiratory distress. She has no wheezes. She has no rales. She exhibits no tenderness.  Abdominal: Soft. Bowel sounds are normal. She exhibits no distension and no mass. There is no tenderness. There is no rebound and no guarding. No hernia.  Genitourinary:  Genitourinary Comments: Breast and pelvic exams deferred with shared decision making  Musculoskeletal: Normal range of motion. She exhibits no edema, tenderness or deformity.  Lymphadenopathy:    She has no cervical adenopathy.  Neurological: She is alert and oriented to person, place, and time. She displays normal reflexes. No cranial nerve deficit or sensory deficit. She exhibits normal muscle tone. Coordination normal.  Skin: Skin is warm, dry and intact. Capillary refill takes less than 2 seconds. No rash noted. She is not diaphoretic. No erythema. No pallor.  Psychiatric: She has a normal mood and affect. Her speech is normal and behavior is normal. Judgment and thought content normal. Cognition and memory are normal.  Nursing note and vitals reviewed.   Results for orders placed or performed in visit on 08/26/17  CBC with Differential/Platelet  Result Value Ref Range   WBC 9.1 3.4 - 10.8 x10E3/uL   RBC 4.41 3.77 - 5.28 x10E6/uL   Hemoglobin 12.8 11.1 - 15.9 g/dL   Hematocrit 37.6 34.0 - 46.6 %   MCV 85 79 - 97 fL   MCH 29.0 26.6 - 33.0 pg   MCHC 34.0 31.5 -  35.7 g/dL   RDW 14.4 12.3 - 15.4 %    Platelets 291 150 - 379 x10E3/uL   Neutrophils 63 Not Estab. %   Lymphs 29 Not Estab. %   Monocytes 7 Not Estab. %   Eos 1 Not Estab. %   Basos 0 Not Estab. %   Neutrophils Absolute 5.8 1.4 - 7.0 x10E3/uL   Lymphocytes Absolute 2.6 0.7 - 3.1 x10E3/uL   Monocytes Absolute 0.6 0.1 - 0.9 x10E3/uL   EOS (ABSOLUTE) 0.1 0.0 - 0.4 x10E3/uL   Basophils Absolute 0.0 0.0 - 0.2 x10E3/uL   Immature Granulocytes 0 Not Estab. %   Immature Grans (Abs) 0.0 0.0 - 0.1 x10E3/uL  Comprehensive metabolic panel  Result Value Ref Range   Glucose 103 (H) 65 - 99 mg/dL   BUN 11 6 - 24 mg/dL   Creatinine, Ser 0.48 (L) 0.57 - 1.00 mg/dL   GFR calc non Af Amer 112 >59 mL/min/1.73   GFR calc Af Amer 130 >59 mL/min/1.73   BUN/Creatinine Ratio 23 9 - 23   Sodium 138 134 - 144 mmol/L   Potassium 4.0 3.5 - 5.2 mmol/L   Chloride 98 96 - 106 mmol/L   CO2 24 20 - 29 mmol/L   Calcium 9.5 8.7 - 10.2 mg/dL   Total Protein 6.9 6.0 - 8.5 g/dL   Albumin 4.6 3.5 - 5.5 g/dL   Globulin, Total 2.3 1.5 - 4.5 g/dL   Albumin/Globulin Ratio 2.0 1.2 - 2.2   Bilirubin Total 0.2 0.0 - 1.2 mg/dL   Alkaline Phosphatase 82 39 - 117 IU/L   AST 24 0 - 40 IU/L   ALT 17 0 - 32 IU/L  Lipid Panel w/o Chol/HDL Ratio  Result Value Ref Range   Cholesterol, Total 205 (H) 100 - 199 mg/dL   Triglycerides 87 0 - 149 mg/dL   HDL 72 >39 mg/dL   VLDL Cholesterol Cal 17 5 - 40 mg/dL   LDL Calculated 116 (H) 0 - 99 mg/dL  Microalbumin, Urine Waived  Result Value Ref Range   Microalb, Ur Waived 10 0 - 19 mg/L   Creatinine, Urine Waived 50 10 - 300 mg/dL   Microalb/Creat Ratio <30 <30 mg/g  TSH  Result Value Ref Range   TSH 1.580 0.450 - 4.500 uIU/mL  UA/M w/rflx Culture, Routine  Result Value Ref Range   Specific Gravity, UA 1.010 1.005 - 1.030   pH, UA 6.0 5.0 - 7.5   Color, UA Straw Yellow   Appearance Ur Clear Clear   Leukocytes, UA Negative Negative   Protein, UA Negative Negative/Trace   Glucose, UA Negative Negative   Ketones,  UA Negative Negative   RBC, UA Negative Negative   Bilirubin, UA Negative Negative   Urobilinogen, Ur 0.2 0.2 - 1.0 mg/dL   Nitrite, UA Negative Negative  IGP, Aptima HPV, rfx 16/18,45  Result Value Ref Range   DIAGNOSIS: Comment    Specimen adequacy: Comment    Clinician Provided ICD10 Comment    Performed by: Comment    PAP Smear Comment .    Note: Comment    Test Methodology Comment    HPV Aptima Negative Negative      Assessment & Plan:   Problem List Items Addressed This Visit      Cardiovascular and Mediastinum   Hypertension - Primary    Not under good control, will start 5mg  lisinopril and recheck in 4-6 weeks.       Relevant Medications   lisinopril (  PRINIVIL,ZESTRIL) 5 MG tablet   Other Relevant Orders   Comprehensive metabolic panel   CBC with Differential/Platelet   Microalbumin, Urine Waived   TSH   UA/M w/rflx Culture, Routine     Endocrine   Hypothyroidism (Chronic)    Rechecking levels today. Await results. Call with any concerns.       Relevant Orders   Comprehensive metabolic panel   TSH     Other   Anxiety    Under good control on current regimen. Continue current regimen. Continue to monitor. Call with any concerns. Refills given.        Relevant Medications   escitalopram (LEXAPRO) 20 MG tablet   Other Relevant Orders   CBC with Differential/Platelet    Other Visit Diagnoses    Screening for colon cancer       Referral to GI made today.   Relevant Orders   Ambulatory referral to Gastroenterology   Ambulatory referral to Gastroenterology   Screening for cholesterol level       Labs drawn today. Await results.    Relevant Orders   Lipid Panel w/o Chol/HDL Ratio       Follow up plan: Return 4-6 weeks, for follow up BP.   LABORATORY TESTING:  - Pap smear: up to date  IMMUNIZATIONS:   - Tdap: Tetanus vaccination status reviewed: last tetanus booster within 10 years. - Influenza: Postponed to flu  season  SCREENING: -Mammogram: Up to date  - Colonoscopy: Ordered today   PATIENT COUNSELING:   Advised to take 1 mg of folate supplement per day if capable of pregnancy.   Sexuality: Discussed sexually transmitted diseases, partner selection, use of condoms, avoidance of unintended pregnancy  and contraceptive alternatives.   Advised to avoid cigarette smoking.  I discussed with the patient that most people either abstain from alcohol or drink within safe limits (<=14/week and <=4 drinks/occasion for males, <=7/weeks and <= 3 drinks/occasion for females) and that the risk for alcohol disorders and other health effects rises proportionally with the number of drinks per week and how often a drinker exceeds daily limits.  Discussed cessation/primary prevention of drug use and availability of treatment for abuse.   Diet: Encouraged to adjust caloric intake to maintain  or achieve ideal body weight, to reduce intake of dietary saturated fat and total fat, to limit sodium intake by avoiding high sodium foods and not adding table salt, and to maintain adequate dietary potassium and calcium preferably from fresh fruits, vegetables, and low-fat dairy products.    stressed the importance of regular exercise  Injury prevention: Discussed safety belts, safety helmets, smoke detector, smoking near bedding or upholstery.   Dental health: Discussed importance of regular tooth brushing, flossing, and dental visits.    NEXT PREVENTATIVE PHYSICAL DUE IN 1 YEAR. Return 4-6 weeks, for follow up BP.

## 2018-02-27 LAB — COMPREHENSIVE METABOLIC PANEL
A/G RATIO: 2 (ref 1.2–2.2)
ALK PHOS: 102 IU/L (ref 39–117)
ALT: 13 IU/L (ref 0–32)
AST: 22 IU/L (ref 0–40)
Albumin: 4.7 g/dL (ref 3.5–5.5)
BUN/Creatinine Ratio: 19 (ref 9–23)
BUN: 12 mg/dL (ref 6–24)
Bilirubin Total: 0.4 mg/dL (ref 0.0–1.2)
CALCIUM: 9.3 mg/dL (ref 8.7–10.2)
CO2: 24 mmol/L (ref 20–29)
Chloride: 98 mmol/L (ref 96–106)
Creatinine, Ser: 0.63 mg/dL (ref 0.57–1.00)
GFR calc Af Amer: 118 mL/min/{1.73_m2} (ref >59)
GFR, EST NON AFRICAN AMERICAN: 102 mL/min/{1.73_m2} (ref >59)
GLOBULIN, TOTAL: 2.3 g/dL (ref 1.5–4.5)
Glucose: 90 mg/dL (ref 65–99)
POTASSIUM: 4.4 mmol/L (ref 3.5–5.2)
SODIUM: 138 mmol/L (ref 134–144)
Total Protein: 7 g/dL (ref 6.0–8.5)

## 2018-02-27 LAB — CBC WITH DIFFERENTIAL/PLATELET
BASOS: 0 %
Basophils Absolute: 0 10*3/uL (ref 0.0–0.2)
EOS (ABSOLUTE): 0.1 10*3/uL (ref 0.0–0.4)
Eos: 1 %
Hematocrit: 39 % (ref 34.0–46.6)
Hemoglobin: 13.3 g/dL (ref 11.1–15.9)
IMMATURE GRANS (ABS): 0 10*3/uL (ref 0.0–0.1)
IMMATURE GRANULOCYTES: 1 %
LYMPHS: 33 %
Lymphocytes Absolute: 1.9 10*3/uL (ref 0.7–3.1)
MCH: 29.1 pg (ref 26.6–33.0)
MCHC: 34.1 g/dL (ref 31.5–35.7)
MCV: 85 fL (ref 79–97)
MONOS ABS: 0.4 10*3/uL (ref 0.1–0.9)
Monocytes: 8 %
NEUTROS PCT: 57 %
Neutrophils Absolute: 3.3 10*3/uL (ref 1.4–7.0)
Platelets: 283 10*3/uL (ref 150–450)
RBC: 4.57 x10E6/uL (ref 3.77–5.28)
RDW: 13.4 % (ref 12.3–15.4)
WBC: 5.7 10*3/uL (ref 3.4–10.8)

## 2018-02-27 LAB — LIPID PANEL W/O CHOL/HDL RATIO
Cholesterol, Total: 232 mg/dL — ABNORMAL HIGH (ref 100–199)
HDL: 72 mg/dL (ref >39)
LDL CALC: 146 mg/dL — AB (ref 0–99)
TRIGLYCERIDES: 70 mg/dL (ref 0–149)
VLDL Cholesterol Cal: 14 mg/dL (ref 5–40)

## 2018-02-27 LAB — TSH: TSH: 1.28 u[IU]/mL (ref 0.450–4.500)

## 2018-03-12 ENCOUNTER — Encounter: Payer: Self-pay | Admitting: *Deleted

## 2018-03-12 DIAGNOSIS — Z01818 Encounter for other preprocedural examination: Secondary | ICD-10-CM | POA: Diagnosis not present

## 2018-03-12 DIAGNOSIS — Z1211 Encounter for screening for malignant neoplasm of colon: Secondary | ICD-10-CM | POA: Diagnosis not present

## 2018-03-12 DIAGNOSIS — Z23 Encounter for immunization: Secondary | ICD-10-CM | POA: Diagnosis not present

## 2018-03-12 DIAGNOSIS — Z800 Family history of malignant neoplasm of digestive organs: Secondary | ICD-10-CM | POA: Diagnosis not present

## 2018-03-31 IMAGING — MG MM DIGITAL SCREENING BILAT W/ TOMO W/ CAD
9 of 15 series · 9 of 31 positions shown · non-contrast
Comparison: Previous exam(s).

CLINICAL DATA: Screening.

EXAM:
2D DIGITAL SCREENING BILATERAL MAMMOGRAM WITH CAD AND ADJUNCT TOMO

[L MLO synth-2D]
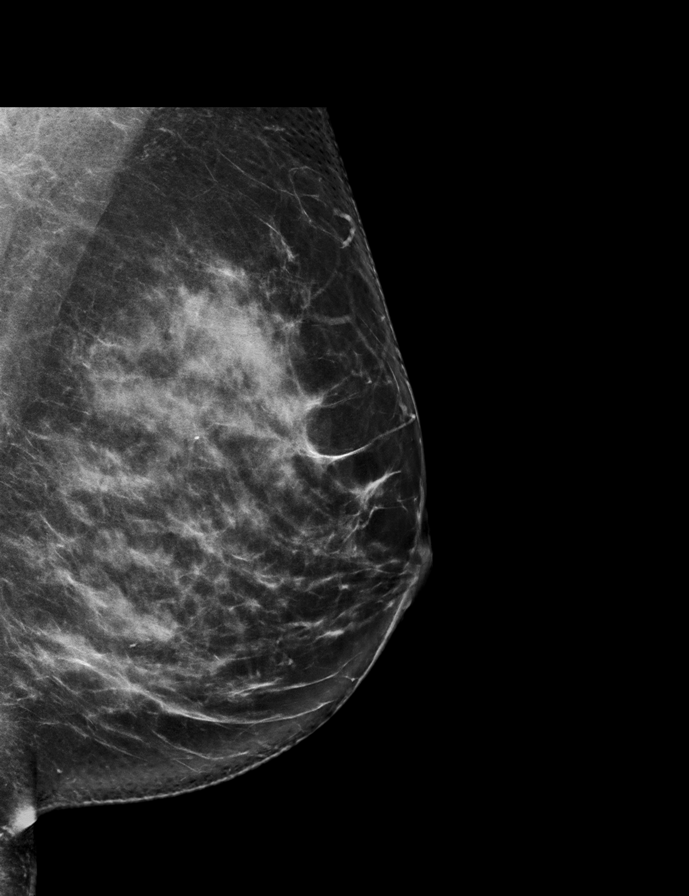

[L CC (1 of 2)]
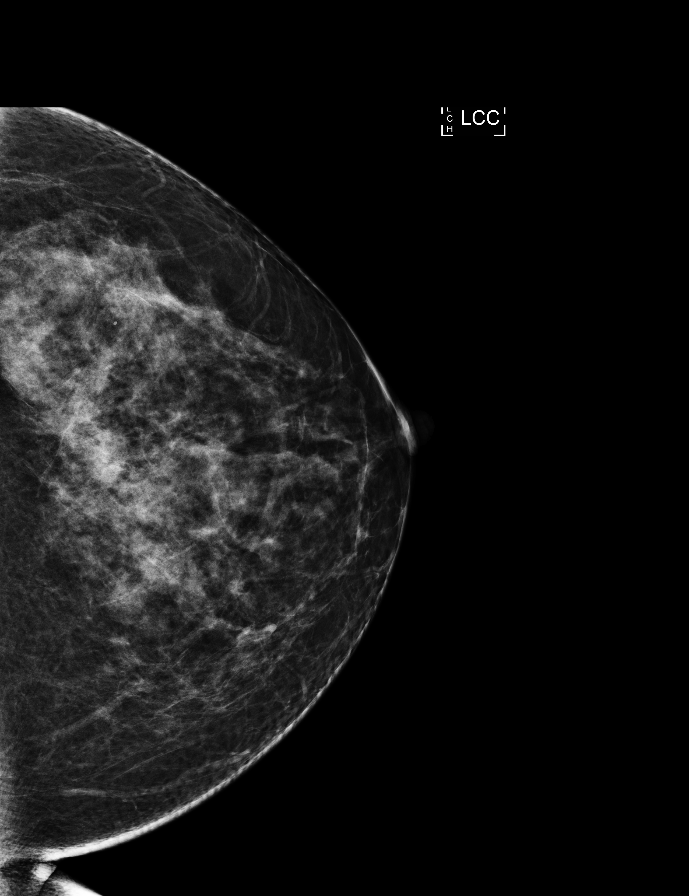

[L MLO]
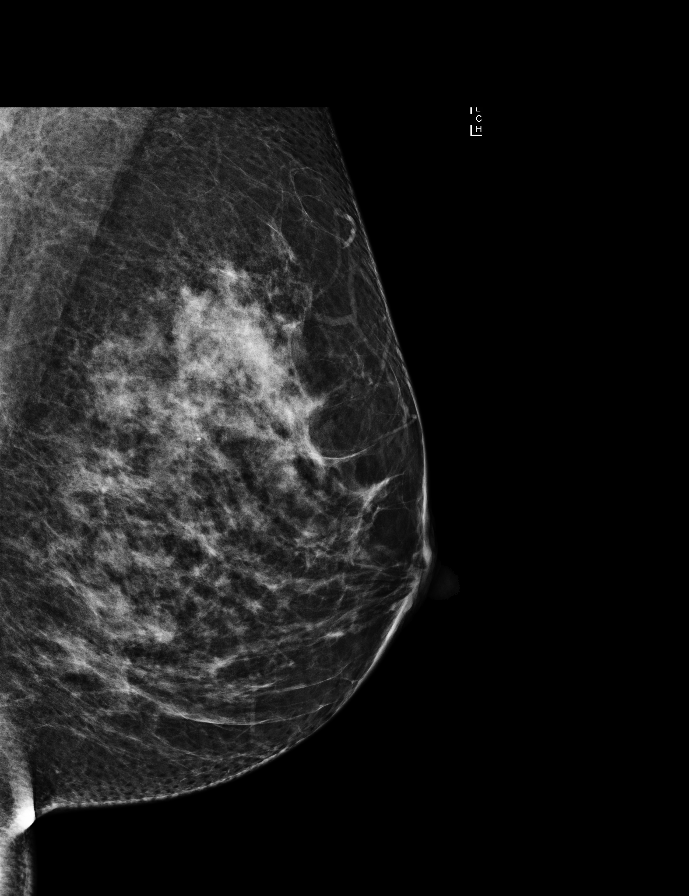

[R CC synth-2D]
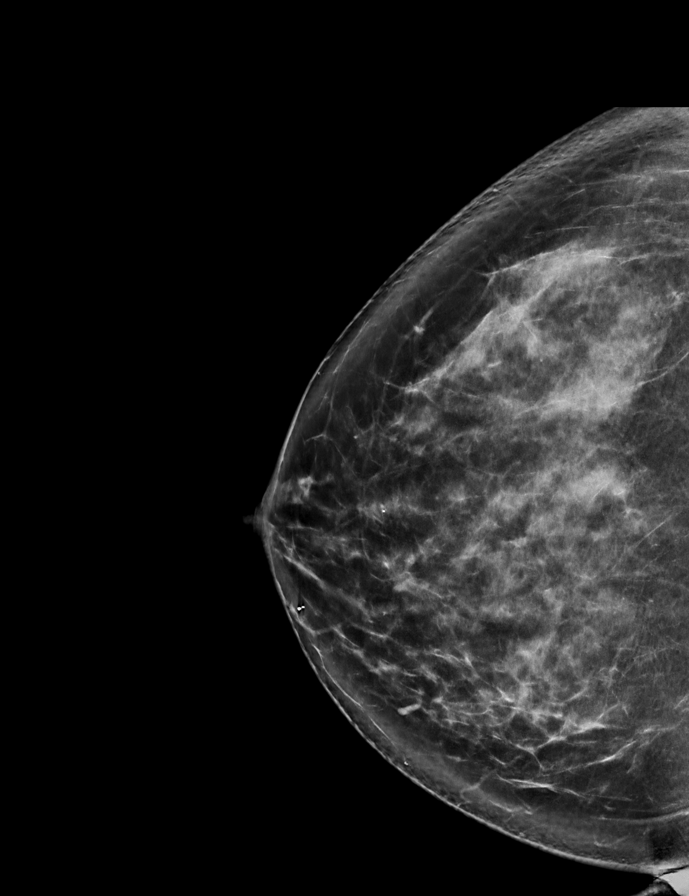

[R MLO]
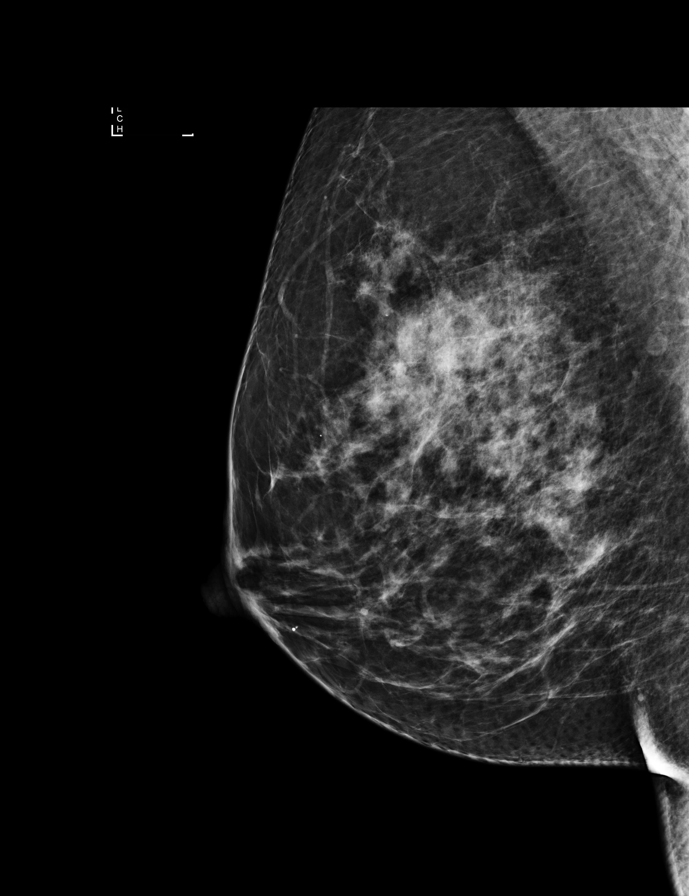

[L CC synth-2D]
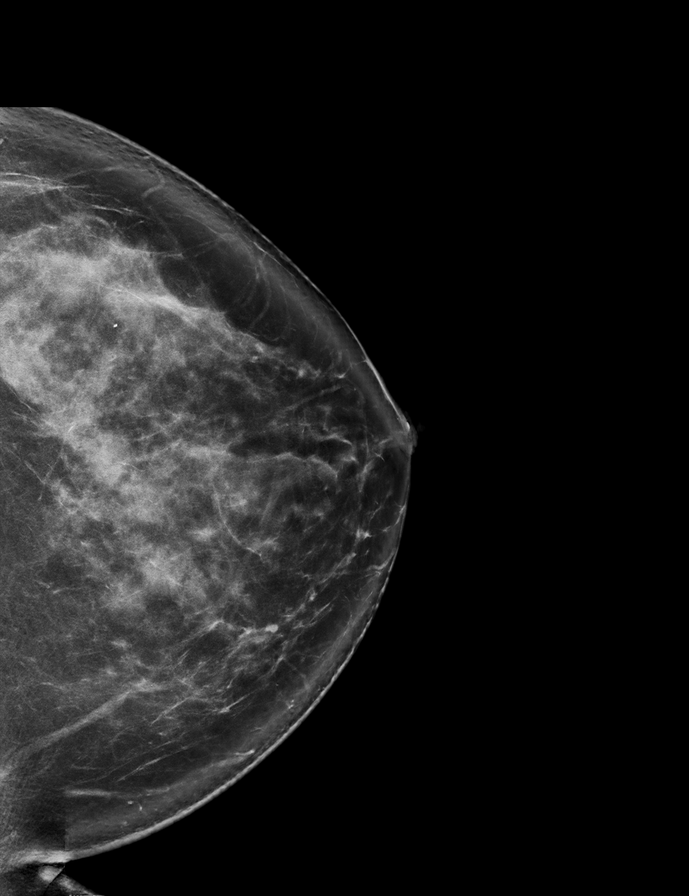

[R CC]
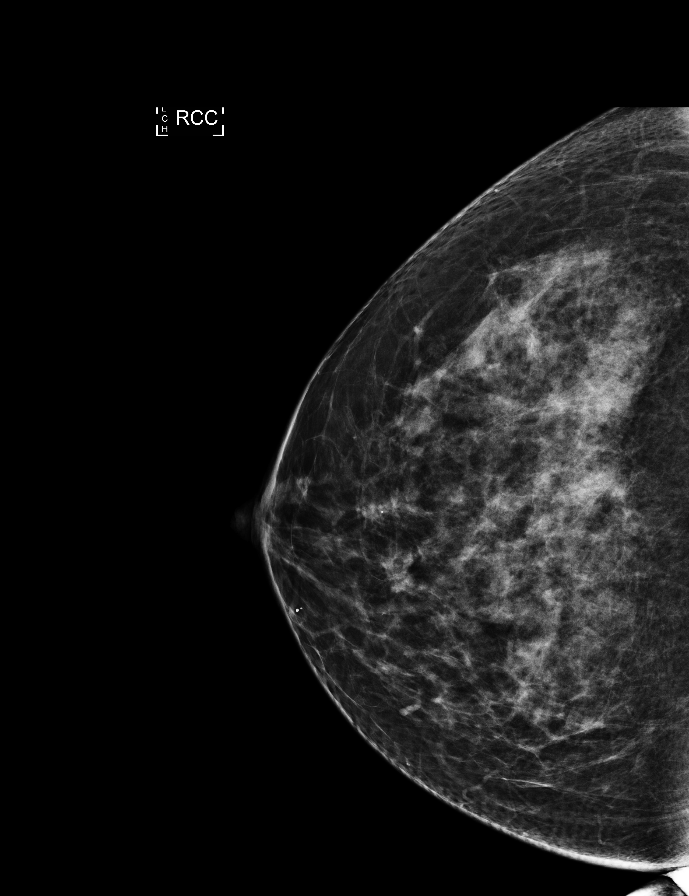

[R MLO synth-2D]
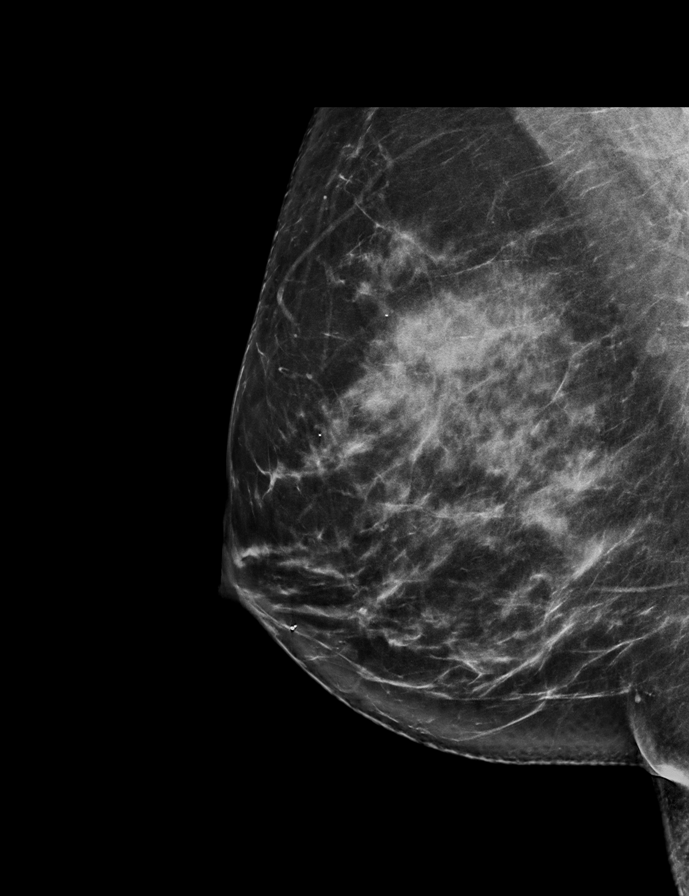

[L CC (2 of 2)]
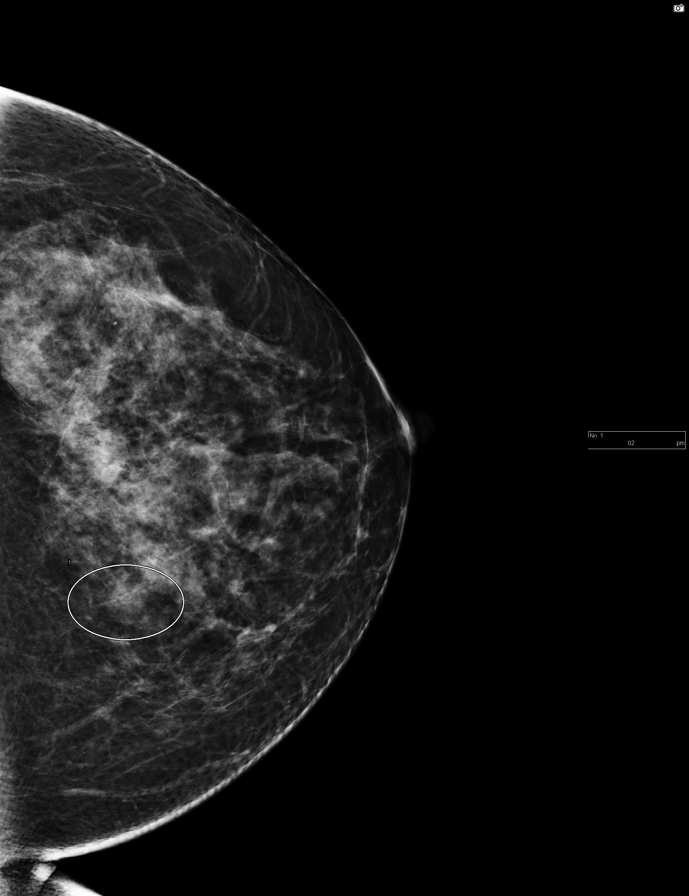

[9 of 31 positions shown; findings below may reference images not displayed]

ACR Breast Density Category c: The breast tissue is heterogeneously
dense, which may obscure small masses.
FINDINGS: In the left breast, a possible mass warrants further evaluation. The
possible mass (or masses) are seen within the inner left breast,
tomosynthesis CC slice 29 and the MLO slice 47.

In the right breast, no findings suspicious for malignancy.

Images were processed with CAD.
IMPRESSION: Further evaluation is suggested for possible mass in the left
breast.

RECOMMENDATION:
Diagnostic mammogram and possibly ultrasound of the left breast.
(Code:TJ-P-AA3)

The patient will be contacted regarding the findings, and additional
imaging will be scheduled.

BI-RADS CATEGORY  0: Incomplete. Need additional imaging evaluation
and/or prior mammograms for comparison.

## 2018-04-03 ENCOUNTER — Other Ambulatory Visit: Payer: Self-pay

## 2018-04-03 ENCOUNTER — Encounter: Payer: Self-pay | Admitting: Family Medicine

## 2018-04-03 ENCOUNTER — Ambulatory Visit (INDEPENDENT_AMBULATORY_CARE_PROVIDER_SITE_OTHER): Payer: 59 | Admitting: Family Medicine

## 2018-04-03 VITALS — BP 138/84 | HR 82 | Temp 98.2°F | Ht 63.0 in | Wt 166.0 lb

## 2018-04-03 DIAGNOSIS — I10 Essential (primary) hypertension: Secondary | ICD-10-CM

## 2018-04-03 MED ORDER — LISINOPRIL 5 MG PO TABS: 5.0000 mg | ORAL_TABLET | Freq: Every day | ORAL | 1 refills | Status: DC

## 2018-04-03 NOTE — Assessment & Plan Note (Addendum)
Under good control on recheck. Call with any concerns. Continue to monitor. Recheck 6 months.

## 2018-04-03 NOTE — Progress Notes (Addendum)
BP 138/84 (BP Location: Left Arm, Cuff Size: Normal)   Pulse 82   Temp 98.2 F (36.8 C) (Oral)   Ht 5\' 3"  (1.6 m)   Wt 166 lb (75.3 kg)   SpO2 96%   BMI 29.41 kg/m    Subjective:    Patient ID: Nichole Gordon, female    DOB: [birth year], 52 y.o.   MRN: [id number]  HPI: Nichole Gordon is a 52 y.o. female  Chief Complaint  Patient presents with  . Hypertension    75m f/u   HYPERTENSION- Mom is in the hospital with cancer. She has been stressed with this.  Hypertension status: controlled  Satisfied with current treatment? yes Duration of hypertension: chronic BP monitoring frequency:  a few times a month BP range: 110s-120s BP medication side effects:  no Medication compliance: excellent compliance Previous BP meds: lisinopril Aspirin: no Recurrent headaches: no Visual changes: no Palpitations: no Dyspnea: no Chest pain: no Lower extremity edema: no Dizzy/lightheaded: no  Relevant past medical, surgical, family and social history reviewed and updated as indicated. Interim medical history since our last visit reviewed. Allergies and medications reviewed and updated.  Review of Systems  Constitutional: Negative.   Respiratory: Negative.   Cardiovascular: Negative.   Psychiatric/Behavioral: Negative.     Per HPI unless specifically indicated above     Objective:    BP 138/84 (BP Location: Left Arm, Cuff Size: Normal)   Pulse 82   Temp 98.2 F (36.8 C) (Oral)   Ht 5\' 3"  (1.6 m)   Wt 166 lb (75.3 kg)   SpO2 96%   BMI 29.41 kg/m   Wt Readings from Last 3 Encounters:  04/03/18 166 lb (75.3 kg)  02/26/18 160 lb 11.2 oz (72.9 kg)  08/26/17 160 lb (72.6 kg)    Physical Exam  Constitutional: She is oriented to person, place, and time. She appears well-developed and well-nourished. No distress.  HENT:  Head: Normocephalic and atraumatic.  Right Ear: Hearing normal.  Left Ear: Hearing normal.  Nose: Nose normal.  Eyes: Conjunctivae and lids are normal.  Right eye exhibits no discharge. Left eye exhibits no discharge. No scleral icterus.  Cardiovascular: Normal rate, regular rhythm, normal heart sounds and intact distal pulses. Exam reveals no gallop and no friction rub.  No murmur heard. Pulmonary/Chest: Effort normal and breath sounds normal. No stridor. No respiratory distress. She has no wheezes. She has no rales. She exhibits no tenderness.  Musculoskeletal: Normal range of motion.  Neurological: She is alert and oriented to person, place, and time.  Skin: Skin is warm, dry and intact. Capillary refill takes less than 2 seconds. No rash noted. She is not diaphoretic. No erythema. No pallor.  Psychiatric: She has a normal mood and affect. Her speech is normal and behavior is normal. Judgment and thought content normal. Cognition and memory are normal.  Nursing note and vitals reviewed.   Results for orders placed or performed in visit on 02/26/18  Comprehensive metabolic panel  Result Value Ref Range   Glucose 90 65 - 99 mg/dL   BUN 12 6 - 24 mg/dL   Creatinine, Ser 0.63 0.57 - 1.00 mg/dL   GFR calc non Af Amer 102 >59 mL/min/1.73   GFR calc Af Amer 118 >59 mL/min/1.73   BUN/Creatinine Ratio 19 9 - 23   Sodium 138 134 - 144 mmol/L   Potassium 4.4 3.5 - 5.2 mmol/L   Chloride 98 96 - 106 mmol/L   CO2 24 20 -  29 mmol/L   Calcium 9.3 8.7 - 10.2 mg/dL   Total Protein 7.0 6.0 - 8.5 g/dL   Albumin 4.7 3.5 - 5.5 g/dL   Globulin, Total 2.3 1.5 - 4.5 g/dL   Albumin/Globulin Ratio 2.0 1.2 - 2.2   Bilirubin Total 0.4 0.0 - 1.2 mg/dL   Alkaline Phosphatase 102 39 - 117 IU/L   AST 22 0 - 40 IU/L   ALT 13 0 - 32 IU/L  CBC with Differential/Platelet  Result Value Ref Range   WBC 5.7 3.4 - 10.8 x10E3/uL   RBC 4.57 3.77 - 5.28 x10E6/uL   Hemoglobin 13.3 11.1 - 15.9 g/dL   Hematocrit 39.0 34.0 - 46.6 %   MCV 85 79 - 97 fL   MCH 29.1 26.6 - 33.0 pg   MCHC 34.1 31.5 - 35.7 g/dL   RDW 13.4 12.3 - 15.4 %   Platelets 283 150 - 450 x10E3/uL    Neutrophils 57 Not Estab. %   Lymphs 33 Not Estab. %   Monocytes 8 Not Estab. %   Eos 1 Not Estab. %   Basos 0 Not Estab. %   Neutrophils Absolute 3.3 1.4 - 7.0 x10E3/uL   Lymphocytes Absolute 1.9 0.7 - 3.1 x10E3/uL   Monocytes Absolute 0.4 0.1 - 0.9 x10E3/uL   EOS (ABSOLUTE) 0.1 0.0 - 0.4 x10E3/uL   Basophils Absolute 0.0 0.0 - 0.2 x10E3/uL   Immature Granulocytes 1 Not Estab. %   Immature Grans (Abs) 0.0 0.0 - 0.1 x10E3/uL  Lipid Panel w/o Chol/HDL Ratio  Result Value Ref Range   Cholesterol, Total 232 (H) 100 - 199 mg/dL   Triglycerides 70 0 - 149 mg/dL   HDL 72 >39 mg/dL   VLDL Cholesterol Cal 14 5 - 40 mg/dL   LDL Calculated 146 (H) 0 - 99 mg/dL  Microalbumin, Urine Waived  Result Value Ref Range   Microalb, Ur Waived 30 (H) 0 - 19 mg/L   Creatinine, Urine Waived 100 10 - 300 mg/dL   Microalb/Creat Ratio <30 <30 mg/g  TSH  Result Value Ref Range   TSH 1.280 0.450 - 4.500 uIU/mL  UA/M w/rflx Culture, Routine  Result Value Ref Range   Specific Gravity, UA 1.015 1.005 - 1.030   pH, UA 6.5 5.0 - 7.5   Color, UA Yellow Yellow   Appearance Ur Hazy (A) Clear   Leukocytes, UA Negative Negative   Protein, UA Negative Negative/Trace   Glucose, UA Negative Negative   Ketones, UA Negative Negative   RBC, UA Negative Negative   Bilirubin, UA Negative Negative   Urobilinogen, Ur 1.0 0.2 - 1.0 mg/dL   Nitrite, UA Negative Negative      Assessment & Plan:   Problem List Items Addressed This Visit      Cardiovascular and Mediastinum   Hypertension - Primary    Under good control on recheck. Call with any concerns. Continue to monitor. Recheck 6 months.       Relevant Medications   lisinopril (PRINIVIL,ZESTRIL) 5 MG tablet   Other Relevant Orders   Basic metabolic panel       Follow up plan: Return in about 6 months (around 10/03/2018) for Physical.

## 2018-04-04 LAB — BASIC METABOLIC PANEL
BUN/Creatinine Ratio: 31 — ABNORMAL HIGH (ref 9–23)
BUN: 15 mg/dL (ref 6–24)
CALCIUM: 9 mg/dL (ref 8.7–10.2)
CHLORIDE: 99 mmol/L (ref 96–106)
CO2: 25 mmol/L (ref 20–29)
Creatinine, Ser: 0.48 mg/dL — ABNORMAL LOW (ref 0.57–1.00)
GFR, EST AFRICAN AMERICAN: 129 mL/min/{1.73_m2} (ref >59)
GFR, EST NON AFRICAN AMERICAN: 112 mL/min/{1.73_m2} (ref >59)
Glucose: 85 mg/dL (ref 65–99)
POTASSIUM: 4.3 mmol/L (ref 3.5–5.2)
SODIUM: 136 mmol/L (ref 134–144)

## 2018-08-11 ENCOUNTER — Other Ambulatory Visit: Payer: Self-pay | Admitting: Family Medicine

## 2018-08-12 ENCOUNTER — Encounter: Payer: Self-pay | Admitting: Family Medicine

## 2018-08-14 MED ORDER — LOSARTAN POTASSIUM 25 MG PO TABS: 12.5000 mg | ORAL_TABLET | Freq: Every day | ORAL | 0 refills | Status: DC

## 2018-08-14 NOTE — Telephone Encounter (Signed)
Copied from [place] [phone]. Topic: General - Other >> Aug 14, 2018  2:47 PM Sheran Luz wrote: Reason for CRM: Patient just called to inquire if Dr. Wynetta Emery has had the chance to read mychart message from 2/26.

## 2018-08-20 DIAGNOSIS — J0100 Acute maxillary sinusitis, unspecified: Secondary | ICD-10-CM | POA: Diagnosis not present

## 2018-08-24 ENCOUNTER — Other Ambulatory Visit: Payer: Self-pay | Admitting: Family Medicine

## 2018-08-24 DIAGNOSIS — Z1231 Encounter for screening mammogram for malignant neoplasm of breast: Secondary | ICD-10-CM

## 2018-09-03 ENCOUNTER — Other Ambulatory Visit: Payer: Self-pay | Admitting: Family Medicine

## 2018-09-08 ENCOUNTER — Other Ambulatory Visit: Payer: Self-pay | Admitting: Family Medicine

## 2018-10-02 ENCOUNTER — Ambulatory Visit (INDEPENDENT_AMBULATORY_CARE_PROVIDER_SITE_OTHER): Payer: 59 | Admitting: Family Medicine

## 2018-10-02 ENCOUNTER — Other Ambulatory Visit: Payer: Self-pay

## 2018-10-02 ENCOUNTER — Encounter: Payer: Self-pay | Admitting: Family Medicine

## 2018-10-02 VITALS — BP 120/81 | HR 80 | Wt 160.0 lb

## 2018-10-02 DIAGNOSIS — I10 Essential (primary) hypertension: Secondary | ICD-10-CM | POA: Diagnosis not present

## 2018-10-02 DIAGNOSIS — F419 Anxiety disorder, unspecified: Secondary | ICD-10-CM

## 2018-10-02 DIAGNOSIS — Z13220 Encounter for screening for lipoid disorders: Secondary | ICD-10-CM | POA: Diagnosis not present

## 2018-10-02 DIAGNOSIS — E039 Hypothyroidism, unspecified: Secondary | ICD-10-CM

## 2018-10-02 MED ORDER — ESCITALOPRAM OXALATE 20 MG PO TABS: 20.0000 mg | ORAL_TABLET | Freq: Every day | ORAL | 1 refills | Status: DC

## 2018-10-02 MED ORDER — LOSARTAN POTASSIUM 25 MG PO TABS: 12.5000 mg | ORAL_TABLET | Freq: Every day | ORAL | 1 refills | Status: DC

## 2018-10-02 NOTE — Assessment & Plan Note (Signed)
Under good control on current regimen. Continue current regimen. Continue to monitor. Call with any concerns. Refills given. Labs to be drawn shortly.  

## 2018-10-02 NOTE — Progress Notes (Signed)
BP 120/81   Pulse 80   Wt 160 lb (72.6 kg)   LMP 09/25/2018   BMI 28.34 kg/m    Subjective:    Patient ID: Nichole Gordon, female    DOB: 10/27/1965, 53 y.o.   MRN: [id number]  HPI: Nichole Gordon is a 53 y.o. female  Chief Complaint  Patient presents with  . Hypertension  . Hypothyroidism  . Anxiety   HYPERTENSION Hypertension status: controlled  Satisfied with current treatment? yes Duration of hypertension: chronic BP monitoring frequency:  not checking BP medication side effects:  no Medication compliance: excellent compliance Previous BP meds:lisinopril, losartan Aspirin: no Recurrent headaches: no Visual changes: no Palpitations: no Dyspnea: no Chest pain: no Lower extremity edema: no Dizzy/lightheaded: no  HYPOTHYROIDISM Thyroid control status:controlled Satisfied with current treatment? yes Medication side effects: no Medication compliance: excellent compliance Recent dose adjustment:no Fatigue: no Cold intolerance: no Heat intolerance: no Weight gain: no Weight loss: no Constipation: no Diarrhea/loose stools: no Palpitations: no Lower extremity edema: no Anxiety/depressed mood: no  ANXIETY/STRESS Duration:controlled Anxious mood: no  Excessive worrying: no Irritability: no  Sweating: no Nausea: no Palpitations:no Hyperventilation: no Panic attacks: no Agoraphobia: no  Obscessions/compulsions: no Depressed mood: no Depression screen [facility] 2/9 10/02/2018 02/26/2018 02/26/2018 08/26/2017 12/06/2016  Decreased Interest 0 0 0 0 0  Down, Depressed, Hopeless 0 0 0 0 0  PHQ - 2 Score 0 0 0 0 0  Altered sleeping 0 0 - 0 -  Tired, decreased energy 1 1 - 1 -  Change in appetite 0 0 - 0 -  Feeling bad or failure about yourself  0 0 - 0 -  Trouble concentrating 0 0 - 0 -  Moving slowly or fidgety/restless 0 0 - 0 -  Suicidal thoughts 0 0 - 0 -  PHQ-9 Score 1 1 - 1 -  Difficult doing work/chores Not difficult at all Not difficult at all - Not  difficult at all -   Anhedonia: no Weight changes: no Insomnia: no   Hypersomnia: no Fatigue/loss of energy: no Feelings of worthlessness: no Feelings of guilt: no Impaired concentration/indecisiveness: no Suicidal ideations: no  Crying spells: no Recent Stressors/Life Changes: no   Relationship problems: no   Family stress: no     Financial stress: no    Job stress: no    Recent death/loss: no   Relevant past medical, surgical, family and social history reviewed and updated as indicated. Interim medical history since our last visit reviewed. Allergies and medications reviewed and updated.  Review of Systems  Constitutional: Negative.   Respiratory: Negative.   Cardiovascular: Negative.   Musculoskeletal: Negative.   Neurological: Negative.   Psychiatric/Behavioral: Negative.     Per HPI unless specifically indicated above     Objective:    BP 120/81   Pulse 80   Wt 160 lb (72.6 kg)   LMP 09/25/2018   BMI 28.34 kg/m   Wt Readings from Last 3 Encounters:  10/02/18 160 lb (72.6 kg)  04/03/18 166 lb (75.3 kg)  02/26/18 160 lb 11.2 oz (72.9 kg)    Physical Exam Vitals signs and nursing note reviewed.  Constitutional:      General: She is not in acute distress.    Appearance: Normal appearance. She is not ill-appearing, toxic-appearing or diaphoretic.  HENT:     Head: Normocephalic and atraumatic.     Right Ear: External ear normal.     Left Ear: External ear normal.  Nose: Nose normal.     Mouth/Throat:     Mouth: Mucous membranes are moist.     Pharynx: Oropharynx is clear.  Eyes:     General: No scleral icterus.       Right eye: No discharge.        Left eye: No discharge.     Conjunctiva/sclera: Conjunctivae normal.     Pupils: Pupils are equal, round, and reactive to light.  Neck:     Musculoskeletal: Normal range of motion.  Pulmonary:     Effort: Pulmonary effort is normal. No respiratory distress.     Comments: Speaking in full sentences  Musculoskeletal: Normal range of motion.  Skin:    Coloration: Skin is not jaundiced or pale.     Findings: No bruising, erythema, lesion or rash.  Neurological:     Mental Status: She is alert and oriented to person, place, and time. Mental status is at baseline.  Psychiatric:        Mood and Affect: Mood normal.        Behavior: Behavior normal.        Thought Content: Thought content normal.        Judgment: Judgment normal.     Results for orders placed or performed in visit on 63/87/56  Basic metabolic panel  Result Value Ref Range   Glucose 85 65 - 99 mg/dL   BUN 15 6 - 24 mg/dL   Creatinine, Ser 0.48 (L) 0.57 - 1.00 mg/dL   GFR calc non Af Amer 112 >59 mL/min/1.73   GFR calc Af Amer 129 >59 mL/min/1.73   BUN/Creatinine Ratio 31 (H) 9 - 23   Sodium 136 134 - 144 mmol/L   Potassium 4.3 3.5 - 5.2 mmol/L   Chloride 99 96 - 106 mmol/L   CO2 25 20 - 29 mmol/L   Calcium 9.0 8.7 - 10.2 mg/dL      Assessment & Plan:   Problem List Items Addressed This Visit      Cardiovascular and Mediastinum   Hypertension - Primary    Under good control on current regimen. Continue current regimen. Continue to monitor. Call with any concerns. Refills given. Labs to be drawn shortly.       Relevant Medications   losartan (COZAAR) 25 MG tablet   Other Relevant Orders   CBC with Differential/Platelet   Comprehensive metabolic panel   Microalbumin, Urine Waived   UA/M w/rflx Culture, Routine     Endocrine   Hypothyroidism (Chronic)    Will recheck labs and treat as needed. Has been feeling well. No concerns.       Relevant Orders   CBC with Differential/Platelet   Comprehensive metabolic panel   TSH     Other   Anxiety    Under good control on current regimen. Continue current regimen. Continue to monitor. Call with any concerns. Refills given. Labs to be drawn shortly.      Relevant Medications   escitalopram (LEXAPRO) 20 MG tablet   Other Relevant Orders   CBC with  Differential/Platelet   Comprehensive metabolic panel    Other Visit Diagnoses    Screening for cholesterol level       Labs to be drawn. Await results.    Relevant Orders   Lipid Panel w/o Chol/HDL Ratio       Follow up plan: Return As scheduled, for Physical.   . This visit was completed via FaceTime due to the restrictions of the COVID-19 pandemic. All  issues as above were discussed and addressed. Physical exam was done as above through visual confirmation on FaceTime. If it was felt that the patient should be evaluated in the office, they were directed there. The patient verbally consented to this visit. . Location of the patient: work . Location of the provider: work . Those involved with this call:  . Provider: Park Liter, DO . CMA: Tiffany Reel, CMA . Front Desk/Registration: Don Perking  . Time spent on call: 25 minutes with patient face to face via video conference. More than 50% of this time was spent in counseling and coordination of care. 40 minutes total spent in review of patient's record and preparation of their chart.

## 2018-10-02 NOTE — Assessment & Plan Note (Signed)
Will recheck labs and treat as needed. Has been feeling well. No concerns.

## 2018-11-05 ENCOUNTER — Telehealth: Payer: Self-pay | Admitting: Family Medicine

## 2018-11-05 ENCOUNTER — Other Ambulatory Visit: Payer: Self-pay | Admitting: Family Medicine

## 2018-11-05 MED ORDER — LEVOTHYROXINE SODIUM 25 MCG PO TABS: 25.0000 ug | ORAL_TABLET | Freq: Every day | ORAL | 0 refills | Status: DC

## 2018-11-05 NOTE — Telephone Encounter (Signed)
Called pt to get labs scheduled, no answer, left voicemail. Pt is wanting refill on Synthroid. Please advise.

## 2018-11-05 NOTE — Telephone Encounter (Signed)
Copied from [place] [id number]. Topic: General - Inquiry >> Nov 05, 2018  8:58 AM Mathis Bud wrote: Reason for CRM: Patient had appt with PCP 4/17 to refill her thyroid medication. (levothyroxine (SYNTHROID) 25 MCG tablet ) Patient states she has been waiting for a call to schedule labs. Patient is running low on medication (SYNTHROID) Patient call back # [phone]

## 2018-11-05 NOTE — Telephone Encounter (Signed)
Refill for synthroid given. Please get labs scheduled.

## 2018-11-11 ENCOUNTER — Other Ambulatory Visit: Payer: Self-pay

## 2018-11-11 ENCOUNTER — Other Ambulatory Visit: Payer: 59

## 2018-11-11 DIAGNOSIS — E039 Hypothyroidism, unspecified: Secondary | ICD-10-CM

## 2018-11-11 DIAGNOSIS — I10 Essential (primary) hypertension: Secondary | ICD-10-CM

## 2018-11-11 DIAGNOSIS — F419 Anxiety disorder, unspecified: Secondary | ICD-10-CM

## 2018-11-11 DIAGNOSIS — Z13220 Encounter for screening for lipoid disorders: Secondary | ICD-10-CM

## 2018-11-11 LAB — UA/M W/RFLX CULTURE, ROUTINE
Bilirubin, UA: NEGATIVE
Glucose, UA: NEGATIVE
Ketones, UA: NEGATIVE
Leukocytes,UA: NEGATIVE
Nitrite, UA: NEGATIVE
Protein,UA: NEGATIVE
RBC, UA: NEGATIVE
Specific Gravity, UA: 1.02 (ref 1.005–1.030)
Urobilinogen, Ur: 0.2 mg/dL (ref 0.2–1.0)
pH, UA: 7 (ref 5.0–7.5)

## 2018-11-11 LAB — MICROALBUMIN, URINE WAIVED
Creatinine, Urine Waived: 100 mg/dL (ref 10–300)
Microalb, Ur Waived: 10 mg/L (ref 0–19)
Microalb/Creat Ratio: <30 mg/g (ref <30)

## 2018-11-12 ENCOUNTER — Other Ambulatory Visit: Payer: Self-pay | Admitting: Family Medicine

## 2018-11-12 LAB — COMPREHENSIVE METABOLIC PANEL
ALT: 15 IU/L (ref 0–32)
AST: 23 IU/L (ref 0–40)
Albumin/Globulin Ratio: 2.1 (ref 1.2–2.2)
Albumin: 4.5 g/dL (ref 3.8–4.9)
Alkaline Phosphatase: 96 IU/L (ref 39–117)
BUN/Creatinine Ratio: 20 (ref 9–23)
BUN: 12 mg/dL (ref 6–24)
Bilirubin Total: 0.4 mg/dL (ref 0.0–1.2)
CO2: 26 mmol/L (ref 20–29)
Calcium: 9 mg/dL (ref 8.7–10.2)
Chloride: 101 mmol/L (ref 96–106)
Creatinine, Ser: 0.6 mg/dL (ref 0.57–1.00)
GFR calc Af Amer: 119 mL/min/{1.73_m2} (ref >59)
GFR calc non Af Amer: 103 mL/min/{1.73_m2} (ref >59)
Globulin, Total: 2.1 g/dL (ref 1.5–4.5)
Glucose: 81 mg/dL (ref 65–99)
Potassium: 4.3 mmol/L (ref 3.5–5.2)
Sodium: 141 mmol/L (ref 134–144)
Total Protein: 6.6 g/dL (ref 6.0–8.5)

## 2018-11-12 LAB — LIPID PANEL W/O CHOL/HDL RATIO
Cholesterol, Total: 212 mg/dL — ABNORMAL HIGH (ref 100–199)
HDL: 63 mg/dL (ref >39)
LDL Calculated: 129 mg/dL — ABNORMAL HIGH (ref 0–99)
Triglycerides: 99 mg/dL (ref 0–149)
VLDL Cholesterol Cal: 20 mg/dL (ref 5–40)

## 2018-11-12 LAB — CBC WITH DIFFERENTIAL/PLATELET
Basophils Absolute: 0 10*3/uL (ref 0.0–0.2)
Basos: 0 %
EOS (ABSOLUTE): 0.1 10*3/uL (ref 0.0–0.4)
Eos: 1 %
Hematocrit: 39.8 % (ref 34.0–46.6)
Hemoglobin: 13.4 g/dL (ref 11.1–15.9)
Immature Grans (Abs): 0 10*3/uL (ref 0.0–0.1)
Immature Granulocytes: 0 %
Lymphocytes Absolute: 2.1 10*3/uL (ref 0.7–3.1)
Lymphs: 33 %
MCH: 29.8 pg (ref 26.6–33.0)
MCHC: 33.7 g/dL (ref 31.5–35.7)
MCV: 88 fL (ref 79–97)
Monocytes Absolute: 0.5 10*3/uL (ref 0.1–0.9)
Monocytes: 7 %
Neutrophils Absolute: 3.7 10*3/uL (ref 1.4–7.0)
Neutrophils: 59 %
Platelets: 264 10*3/uL (ref 150–450)
RBC: 4.5 x10E6/uL (ref 3.77–5.28)
RDW: 12.6 % (ref 11.7–15.4)
WBC: 6.4 10*3/uL (ref 3.4–10.8)

## 2018-11-12 LAB — TSH: TSH: 1.43 u[IU]/mL (ref 0.450–4.500)

## 2018-11-12 MED ORDER — LEVOTHYROXINE SODIUM 25 MCG PO TABS: 25.0000 ug | ORAL_TABLET | Freq: Every day | ORAL | 3 refills | Status: DC

## 2018-11-26 ENCOUNTER — Other Ambulatory Visit: Payer: Self-pay

## 2018-11-26 ENCOUNTER — Other Ambulatory Visit: Payer: Self-pay | Admitting: Family Medicine

## 2018-11-26 ENCOUNTER — Ambulatory Visit
Admission: RE | Admit: 2018-11-26 | Discharge: 2018-11-26 | Disposition: A | Discharge status: Home / Self Care | Payer: 59 | Source: Ambulatory Visit | Attending: Family Medicine | Admitting: Family Medicine

## 2018-11-26 DIAGNOSIS — Z1231 Encounter for screening mammogram for malignant neoplasm of breast: Secondary | ICD-10-CM | POA: Diagnosis not present

## 2018-11-26 DIAGNOSIS — R928 Other abnormal and inconclusive findings on diagnostic imaging of breast: Secondary | ICD-10-CM

## 2018-11-26 DIAGNOSIS — N6310 Unspecified lump in the right breast, unspecified quadrant: Secondary | ICD-10-CM

## 2018-12-04 ENCOUNTER — Ambulatory Visit
Admission: RE | Admit: 2018-12-04 | Discharge: 2018-12-04 | Disposition: A | Discharge status: Home / Self Care | Payer: 59 | Source: Ambulatory Visit | Attending: Family Medicine | Admitting: Family Medicine

## 2018-12-04 ENCOUNTER — Other Ambulatory Visit: Payer: Self-pay

## 2018-12-04 DIAGNOSIS — N6310 Unspecified lump in the right breast, unspecified quadrant: Secondary | ICD-10-CM

## 2018-12-04 DIAGNOSIS — R928 Other abnormal and inconclusive findings on diagnostic imaging of breast: Secondary | ICD-10-CM | POA: Diagnosis present

## 2018-12-07 ENCOUNTER — Other Ambulatory Visit: Payer: Self-pay | Admitting: Family Medicine

## 2018-12-07 DIAGNOSIS — R928 Other abnormal and inconclusive findings on diagnostic imaging of breast: Secondary | ICD-10-CM

## 2018-12-07 DIAGNOSIS — N6310 Unspecified lump in the right breast, unspecified quadrant: Secondary | ICD-10-CM

## 2018-12-07 DIAGNOSIS — N6489 Other specified disorders of breast: Secondary | ICD-10-CM

## 2019-01-03 NOTE — Progress Notes (Signed)
BP 138/79   Pulse 74   Temp 99 F (37.2 C)   Ht 5' 4.02" (1.626 m)   Wt 165 lb (74.8 kg)   SpO2 98%   BMI 28.31 kg/m    Subjective:    Patient ID: Nichole Gordon, female    DOB: Oct 01, 1965, 53 y.o.   MRN: [id number]  HPI: RANESHA VAL is a 53 y.o. female presenting on 01/04/2019 for comprehensive medical examination. Current medical complaints include:  HYPERTENSION Hypertension status: stable  Satisfied with current treatment? yes Duration of hypertension: chronic BP monitoring frequency:  not checking BP medication side effects:  no Medication compliance: excellent compliance Previous BP meds: losartan Aspirin: no Recurrent headaches: no Visual changes: no Palpitations: no Dyspnea: no Chest pain: no Lower extremity edema: no Dizzy/lightheaded: no  HYPOTHYROIDISM Thyroid control status:stable Satisfied with current treatment? yes Medication side effects: no Medication compliance: excellent compliance Recent dose adjustment:no Fatigue: no Cold intolerance: no Heat intolerance: no Weight gain: no Weight loss: no Constipation: no Diarrhea/loose stools: no Palpitations: no Lower extremity edema: no Anxiety/depressed mood: no  ANXIETY/STRESS Duration:controlled Anxious mood: yes  Excessive worrying: no Irritability: no  Sweating: no Nausea: no Palpitations:no Hyperventilation: no Panic attacks: no Agoraphobia: no  Obscessions/compulsions: no Depressed mood: no Depression screen [facility] 2/9 01/04/2019 10/02/2018 02/26/2018 02/26/2018 08/26/2017  Decreased Interest 0 0 0 0 0  Down, Depressed, Hopeless 0 0 0 0 0  PHQ - 2 Score 0 0 0 0 0  Altered sleeping 0 0 0 - 0  Tired, decreased energy 0 1 1 - 1  Change in appetite 0 0 0 - 0  Feeling bad or failure about yourself  0 0 0 - 0  Trouble concentrating 0 0 0 - 0  Moving slowly or fidgety/restless 0 0 0 - 0  Suicidal thoughts 0 0 0 - 0  PHQ-9 Score 0 1 1 - 1  Difficult doing work/chores Not difficult at all  Not difficult at all Not difficult at all - Not difficult at all   GAD 7 : Generalized Anxiety Score 10/02/2018 02/26/2018 12/06/2016 11/21/2015  Nervous, Anxious, on Edge 0 0 0 0  Control/stop worrying 0 0 0 0  Worry too much - different things 0 0 0 0  Trouble relaxing 0 0 0 0  Restless 0 0 0 0  Easily annoyed or irritable 0 0 0 0  Afraid - awful might happen 0 0 0 0  Total GAD 7 Score 0 0 0 0  Anxiety Difficulty Not difficult at all Not difficult at all - Not difficult at all   Anhedonia: no Weight changes: no Insomnia: no   Hypersomnia: no Fatigue/loss of energy: no Feelings of worthlessness: no Feelings of guilt: no Impaired concentration/indecisiveness: no Suicidal ideations: no  Crying spells: no Recent Stressors/Life Changes: yes   Relationship problems: no   Family stress: yes     Financial stress: no    Job stress: no    Recent death/loss: no  She currently lives with: husband and kids Menopausal Symptoms: no  Depression Screen done today and results listed below:  Depression screen [facility] 2/9 01/04/2019 10/02/2018 02/26/2018 02/26/2018 08/26/2017  Decreased Interest 0 0 0 0 0  Down, Depressed, Hopeless 0 0 0 0 0  PHQ - 2 Score 0 0 0 0 0  Altered sleeping 0 0 0 - 0  Tired, decreased energy 0 1 1 - 1  Change in appetite 0 0 0 - 0  Feeling bad or  failure about yourself  0 0 0 - 0  Trouble concentrating 0 0 0 - 0  Moving slowly or fidgety/restless 0 0 0 - 0  Suicidal thoughts 0 0 0 - 0  PHQ-9 Score 0 1 1 - 1  Difficult doing work/chores Not difficult at all Not difficult at all Not difficult at all - Not difficult at all     Past Medical History:  Past Medical History:  Diagnosis Date  . Anxiety   . Fibrocystic breast disease   . Hypertension   . Palpitations     Surgical History:  Past Surgical History:  Procedure Laterality Date  . LITHOTRIPSY  2012    Medications:  Current Outpatient Medications on File Prior to Visit  Medication Sig  . Cholecalciferol  (VITAMIN D) 2000 UNITS CAPS Take by mouth.    . fluticasone (FLONASE) 50 MCG/ACT nasal spray Place 1 spray into both nostrils 2 (two) times daily.   Marland Kitchen levothyroxine (SYNTHROID) 25 MCG tablet Take 1 tablet (25 mcg total) by mouth daily.  . Multiple Vitamin (MULTIVITAMIN) capsule Take 1 capsule by mouth daily.    . Omega-3 Fatty Acids (FISH OIL PO) Take by mouth daily.   No current facility-administered medications on file prior to visit.     Allergies:  Allergies  Allergen Reactions  . Amoxicillin Rash  . Omnipaque [Iohexol] Shortness Of Breath  . Phenytoin Sodium Extended Swelling  . Iodinated Diagnostic Agents Itching  . Trileptal [Oxcarbazepine] Swelling    Social History:  Social History   Socioeconomic History  . Marital status: Married    Spouse name: Not on file  . Number of children: Not on file  . Years of education: Not on file  . Highest education level: Not on file  Occupational History  . Not on file  Social Needs  . Financial resource strain: Not on file  . Food insecurity    Worry: Not on file    Inability: Not on file  . Transportation needs    Medical: Not on file    Non-medical: Not on file  Tobacco Use  . Smoking status: Never Smoker  . Smokeless tobacco: Never Used  Substance and Sexual Activity  . Alcohol use: No  . Drug use: No  . Sexual activity: Yes    Birth control/protection: None  Lifestyle  . Physical activity    Days per week: Not on file    Minutes per session: Not on file  . Stress: Not on file  Relationships  . Social Herbalist on phone: Not on file    Gets together: Not on file    Attends religious service: Not on file    Active member of club or organization: Not on file    Attends meetings of clubs or organizations: Not on file    Relationship status: Not on file  . Intimate partner violence    Fear of current or ex partner: Not on file    Emotionally abused: Not on file    Physically abused: Not on file     Forced sexual activity: Not on file  Other Topics Concern  . Not on file  Social History Narrative  . Not on file   Social History   Tobacco Use  Smoking Status Never Smoker  Smokeless Tobacco Never Used   Social History   Substance and Sexual Activity  Alcohol Use No    Family History:  Family History  Problem Relation Age of Onset  .  Cancer Father   . Hyperlipidemia Mother   . Hypertension Mother   . Arthritis Mother   . Cancer Mother        ovarian cancer  . Hypertension Brother   . Breast cancer Paternal Aunt 69  . Breast cancer Paternal Aunt 79    Past medical history, surgical history, medications, allergies, family history and social history reviewed with patient today and changes made to appropriate areas of the chart.   Review of Systems  Constitutional: Negative.   HENT: Negative.   Eyes: Negative.   Respiratory: Negative.   Cardiovascular: Negative.   Gastrointestinal: Positive for heartburn. Negative for abdominal pain, blood in stool, constipation, diarrhea, melena, nausea and vomiting.  Genitourinary: Negative.   Musculoskeletal: Negative.   Skin: Negative.   Neurological: Negative.   Endo/Heme/Allergies: Positive for environmental allergies. Negative for polydipsia. Does not bruise/bleed easily.  Psychiatric/Behavioral: Negative.     All other ROS negative except what is listed above and in the HPI.      Objective:    BP 138/79   Pulse 74   Temp 99 F (37.2 C)   Ht 5' 4.02" (1.626 m)   Wt 165 lb (74.8 kg)   SpO2 98%   BMI 28.31 kg/m   Wt Readings from Last 3 Encounters:  01/04/19 165 lb (74.8 kg)  10/02/18 160 lb (72.6 kg)  04/03/18 166 lb (75.3 kg)    Physical Exam Vitals signs and nursing note reviewed.  Constitutional:      General: She is not in acute distress.    Appearance: Normal appearance. She is not ill-appearing, toxic-appearing or diaphoretic.  HENT:     Head: Normocephalic and atraumatic.     Right Ear: Tympanic  membrane, ear canal and external ear normal. There is no impacted cerumen.     Left Ear: Tympanic membrane, ear canal and external ear normal. There is no impacted cerumen.     Nose: Nose normal. No congestion or rhinorrhea.     Mouth/Throat:     Mouth: Mucous membranes are moist.     Pharynx: Oropharynx is clear. No oropharyngeal exudate or posterior oropharyngeal erythema.  Eyes:     General: No scleral icterus.       Right eye: No discharge.        Left eye: No discharge.     Extraocular Movements: Extraocular movements intact.     Conjunctiva/sclera: Conjunctivae normal.     Pupils: Pupils are equal, round, and reactive to light.  Neck:     Musculoskeletal: Normal range of motion and neck supple. No neck rigidity or muscular tenderness.     Vascular: No carotid bruit.  Cardiovascular:     Rate and Rhythm: Normal rate and regular rhythm.     Pulses: Normal pulses.     Heart sounds: No murmur. No friction rub. No gallop.   Pulmonary:     Effort: Pulmonary effort is normal. No respiratory distress.     Breath sounds: Normal breath sounds. No stridor. No wheezing, rhonchi or rales.  Chest:     Chest wall: No tenderness.  Abdominal:     General: Abdomen is flat. Bowel sounds are normal. There is no distension.     Palpations: Abdomen is soft. There is no mass.     Tenderness: There is no abdominal tenderness. There is no right CVA tenderness, left CVA tenderness, guarding or rebound.     Hernia: No hernia is present.  Genitourinary:    Comments: Breast and  pelvic exams deferred with shared decision making Musculoskeletal: Normal range of motion.        General: No swelling, tenderness, deformity or signs of injury.     Right lower leg: No edema.     Left lower leg: No edema.  Lymphadenopathy:     Cervical: No cervical adenopathy.  Skin:    General: Skin is warm and dry.     Capillary Refill: Capillary refill takes less than 2 seconds.     Coloration: Skin is pale (patch under  her chin, consistent with tinea versicolor). Skin is not jaundiced.     Findings: No bruising, erythema, lesion or rash.  Neurological:     General: No focal deficit present.     Mental Status: She is alert and oriented to person, place, and time. Mental status is at baseline.     Cranial Nerves: No cranial nerve deficit.     Sensory: No sensory deficit.     Motor: No weakness.     Coordination: Coordination normal.     Gait: Gait normal.     Deep Tendon Reflexes: Reflexes normal.  Psychiatric:        Mood and Affect: Mood normal.        Behavior: Behavior normal.        Thought Content: Thought content normal.        Judgment: Judgment normal.     Results for orders placed or performed in visit on 11/11/18  UA/M w/rflx Culture, Routine   Specimen: Urine   URINE  Result Value Ref Range   Specific Gravity, UA 1.020 1.005 - 1.030   pH, UA 7.0 5.0 - 7.5   Color, UA Yellow Yellow   Appearance Ur Clear Clear   Leukocytes,UA Negative Negative   Protein,UA Negative Negative/Trace   Glucose, UA Negative Negative   Ketones, UA Negative Negative   RBC, UA Negative Negative   Bilirubin, UA Negative Negative   Urobilinogen, Ur 0.2 0.2 - 1.0 mg/dL   Nitrite, UA Negative Negative  TSH  Result Value Ref Range   TSH 1.430 0.450 - 4.500 uIU/mL  Microalbumin, Urine Waived  Result Value Ref Range   Microalb, Ur Waived 10 0 - 19 mg/L   Creatinine, Urine Waived 100 10 - 300 mg/dL   Microalb/Creat Ratio <30 <30 mg/g  Lipid Panel w/o Chol/HDL Ratio  Result Value Ref Range   Cholesterol, Total 212 (H) 100 - 199 mg/dL   Triglycerides 99 0 - 149 mg/dL   HDL 63 >39 mg/dL   VLDL Cholesterol Cal 20 5 - 40 mg/dL   LDL Calculated 129 (H) 0 - 99 mg/dL  Comprehensive metabolic panel  Result Value Ref Range   Glucose 81 65 - 99 mg/dL   BUN 12 6 - 24 mg/dL   Creatinine, Ser 0.60 0.57 - 1.00 mg/dL   GFR calc non Af Amer 103 >59 mL/min/1.73   GFR calc Af Amer 119 >59 mL/min/1.73   BUN/Creatinine  Ratio 20 9 - 23   Sodium 141 134 - 144 mmol/L   Potassium 4.3 3.5 - 5.2 mmol/L   Chloride 101 96 - 106 mmol/L   CO2 26 20 - 29 mmol/L   Calcium 9.0 8.7 - 10.2 mg/dL   Total Protein 6.6 6.0 - 8.5 g/dL   Albumin 4.5 3.8 - 4.9 g/dL   Globulin, Total 2.1 1.5 - 4.5 g/dL   Albumin/Globulin Ratio 2.1 1.2 - 2.2   Bilirubin Total 0.4 0.0 - 1.2 mg/dL  Alkaline Phosphatase 96 39 - 117 IU/L   AST 23 0 - 40 IU/L   ALT 15 0 - 32 IU/L  CBC with Differential/Platelet  Result Value Ref Range   WBC 6.4 3.4 - 10.8 x10E3/uL   RBC 4.50 3.77 - 5.28 x10E6/uL   Hemoglobin 13.4 11.1 - 15.9 g/dL   Hematocrit 39.8 34.0 - 46.6 %   MCV 88 79 - 97 fL   MCH 29.8 26.6 - 33.0 pg   MCHC 33.7 31.5 - 35.7 g/dL   RDW 12.6 11.7 - 15.4 %   Platelets 264 150 - 450 x10E3/uL   Neutrophils 59 Not Estab. %   Lymphs 33 Not Estab. %   Monocytes 7 Not Estab. %   Eos 1 Not Estab. %   Basos 0 Not Estab. %   Neutrophils Absolute 3.7 1.4 - 7.0 x10E3/uL   Lymphocytes Absolute 2.1 0.7 - 3.1 x10E3/uL   Monocytes Absolute 0.5 0.1 - 0.9 x10E3/uL   EOS (ABSOLUTE) 0.1 0.0 - 0.4 x10E3/uL   Basophils Absolute 0.0 0.0 - 0.2 x10E3/uL   Immature Granulocytes 0 Not Estab. %   Immature Grans (Abs) 0.0 0.0 - 0.1 x10E3/uL      Assessment & Plan:   Problem List Items Addressed This Visit      Cardiovascular and Mediastinum   Hypertension    Under good control on current regimen. Continue current regimen. Continue to monitor. Call with any concerns. Refills given. Labs drawn today.       Relevant Medications   losartan (COZAAR) 25 MG tablet   Other Relevant Orders   CBC with Differential/Platelet   Comprehensive metabolic panel   Microalbumin, Urine Waived     Endocrine   Hypothyroidism (Chronic)    Labs drawn today. Will adjust as needed. Call with any concerns.      Relevant Orders   Comprehensive metabolic panel   TSH     Other   Anxiety    Under good control on current regimen. Continue current regimen. Continue  to monitor. Call with any concerns. Refills given. Labs drawn today.       Relevant Medications   escitalopram (LEXAPRO) 20 MG tablet   Other Relevant Orders   CBC with Differential/Platelet   Comprehensive metabolic panel    Other Visit Diagnoses    Routine general medical examination at a health care facility    -  Primary   Vaccines updated. Screening labs checked today. Pap and mammo up to date. To call for her colonoscopy. Continue diet and exercise. Call with any concerns.    Relevant Orders   CBC with Differential/Platelet   Comprehensive metabolic panel   Lipid Panel w/o Chol/HDL Ratio   Microalbumin, Urine Waived   TSH   UA/M w/rflx Culture, Routine   Immunization due       Tdap due- given today.   Relevant Orders   Tdap vaccine greater than or equal to 7yo IM (Completed)   Screening for colon cancer       Referral generated today.   Relevant Orders   Ambulatory referral to Gastroenterology   Tinea versicolor       Will treat with ketoconazole cream. Call with any concerns or if not getting better.        Follow up plan: Return in about 6 months (around 07/07/2019) for follow up.   LABORATORY TESTING:  - Pap smear: up to date  IMMUNIZATIONS:   - Tdap: Tetanus vaccination status reviewed: Tdap vaccination indicated and  given today. - Influenza: Postponed to flu season - Pneumovax: Not applicable  SCREENING: -Mammogram: Up to date  - Colonoscopy: Ordered today   PATIENT COUNSELING:   Advised to take 1 mg of folate supplement per day if capable of pregnancy.   Sexuality: Discussed sexually transmitted diseases, partner selection, use of condoms, avoidance of unintended pregnancy  and contraceptive alternatives.   Advised to avoid cigarette smoking.  I discussed with the patient that most people either abstain from alcohol or drink within safe limits (<=14/week and <=4 drinks/occasion for males, <=7/weeks and <= 3 drinks/occasion for females) and that the  risk for alcohol disorders and other health effects rises proportionally with the number of drinks per week and how often a drinker exceeds daily limits.  Discussed cessation/primary prevention of drug use and availability of treatment for abuse.   Diet: Encouraged to adjust caloric intake to maintain  or achieve ideal body weight, to reduce intake of dietary saturated fat and total fat, to limit sodium intake by avoiding high sodium foods and not adding table salt, and to maintain adequate dietary potassium and calcium preferably from fresh fruits, vegetables, and low-fat dairy products.    stressed the importance of regular exercise  Injury prevention: Discussed safety belts, safety helmets, smoke detector, smoking near bedding or upholstery.   Dental health: Discussed importance of regular tooth brushing, flossing, and dental visits.    NEXT PREVENTATIVE PHYSICAL DUE IN 1 YEAR. Return in about 6 months (around 07/07/2019) for follow up.

## 2019-01-04 ENCOUNTER — Other Ambulatory Visit: Payer: Self-pay

## 2019-01-04 ENCOUNTER — Encounter: Payer: Self-pay | Admitting: Family Medicine

## 2019-01-04 ENCOUNTER — Ambulatory Visit (INDEPENDENT_AMBULATORY_CARE_PROVIDER_SITE_OTHER): Payer: 59 | Admitting: Family Medicine

## 2019-01-04 ENCOUNTER — Telehealth: Payer: Self-pay

## 2019-01-04 VITALS — BP 138/79 | HR 74 | Temp 99.0°F | Ht 64.02 in | Wt 165.0 lb

## 2019-01-04 DIAGNOSIS — Z1211 Encounter for screening for malignant neoplasm of colon: Secondary | ICD-10-CM

## 2019-01-04 DIAGNOSIS — Z800 Family history of malignant neoplasm of digestive organs: Secondary | ICD-10-CM

## 2019-01-04 DIAGNOSIS — I10 Essential (primary) hypertension: Secondary | ICD-10-CM

## 2019-01-04 DIAGNOSIS — Z23 Encounter for immunization: Secondary | ICD-10-CM | POA: Diagnosis not present

## 2019-01-04 DIAGNOSIS — B360 Pityriasis versicolor: Secondary | ICD-10-CM

## 2019-01-04 DIAGNOSIS — E039 Hypothyroidism, unspecified: Secondary | ICD-10-CM

## 2019-01-04 DIAGNOSIS — Z0000 Encounter for general adult medical examination without abnormal findings: Secondary | ICD-10-CM | POA: Diagnosis not present

## 2019-01-04 DIAGNOSIS — F419 Anxiety disorder, unspecified: Secondary | ICD-10-CM | POA: Diagnosis not present

## 2019-01-04 LAB — UA/M W/RFLX CULTURE, ROUTINE
Bilirubin, UA: NEGATIVE
Glucose, UA: NEGATIVE
Ketones, UA: NEGATIVE
Leukocytes,UA: NEGATIVE
Nitrite, UA: NEGATIVE
Protein,UA: NEGATIVE
RBC, UA: NEGATIVE
Specific Gravity, UA: 1.025 (ref 1.005–1.030)
Urobilinogen, Ur: 0.2 mg/dL (ref 0.2–1.0)
pH, UA: 5.5 (ref 5.0–7.5)

## 2019-01-04 LAB — MICROALBUMIN, URINE WAIVED
Creatinine, Urine Waived: 200 mg/dL (ref 10–300)
Microalb, Ur Waived: 10 mg/L (ref 0–19)
Microalb/Creat Ratio: <30 mg/g (ref <30)

## 2019-01-04 MED ORDER — KETOCONAZOLE 2 % EX CREA: 1.0000 "application " | TOPICAL_CREAM | Freq: Two times a day (BID) | CUTANEOUS | 0 refills | Status: AC

## 2019-01-04 MED ORDER — ESCITALOPRAM OXALATE 20 MG PO TABS: 20.0000 mg | ORAL_TABLET | Freq: Every day | ORAL | 1 refills | Status: DC

## 2019-01-04 MED ORDER — LOSARTAN POTASSIUM 25 MG PO TABS: 12.5000 mg | ORAL_TABLET | Freq: Every day | ORAL | 1 refills | Status: DC

## 2019-01-04 NOTE — Patient Instructions (Addendum)
https://www.cdc.gov/vaccines/hcp/vis/vis-statements/tdap.pdf">  Tdap Vaccine (Tetanus, Diphtheria and Pertussis): What You Need to Know 1. Why get vaccinated? Tetanus, diphtheria and pertussis are very serious diseases. Tdap vaccine can protect [place] from these diseases. And, Tdap vaccine given to pregnant women can protect newborn babies against pertussis.Marland Kitchen TETANUS (Lockjaw) is rare in the [place] today. It causes painful muscle tightening and stiffness, usually all over the body.  It can lead to tightening of muscles in the head and neck so you can't open your mouth, swallow, or sometimes even breathe. Tetanus kills about 1 out of 10 people who are infected even after receiving the best medical care. DIPHTHERIA is also rare in the [place] today. It can cause a thick coating to form in the back of the throat.  It can lead to breathing problems, heart failure, paralysis, and death. PERTUSSIS (Whooping Cough) causes severe coughing spells, which can cause difficulty breathing, vomiting and disturbed sleep.  It can also lead to weight loss, incontinence, and rib fractures. Up to 2 in 100 adolescents and 5 in 100 adults with pertussis are hospitalized or have complications, which could include pneumonia or death. These diseases are caused by bacteria. Diphtheria and pertussis are spread from person to person through secretions from coughing or sneezing. Tetanus enters the body through cuts, scratches, or wounds. Before vaccines, as many as 200,000 cases of diphtheria, 200,000 cases of pertussis, and hundreds of cases of tetanus, were reported in the [place] each year. Since vaccination began, reports of cases for tetanus and diphtheria have dropped by about 99% and for pertussis by about 80%. 2. Tdap vaccine Tdap vaccine can protect adolescents and adults from tetanus, diphtheria, and pertussis. One dose of Tdap is routinely given at age 19 or 35. People who did not get Tdap at that age  should get it as soon as possible. Tdap is especially important for healthcare professionals and anyone having close contact with a baby younger than 12 months. Pregnant women should get a dose of Tdap during every pregnancy, to protect the newborn from pertussis. Infants are most at risk for severe, life-threatening complications from pertussis. Another vaccine, called Td, protects against tetanus and diphtheria, but not pertussis. A Td booster should be given every 10 years. Tdap may be given as one of these boosters if you have never gotten Tdap before. Tdap may also be given after a severe cut or burn to prevent tetanus infection. Your doctor or the person giving you the vaccine can give you more information. Tdap may safely be given at the same time as other vaccines. 3. Some people should not get this vaccine  A person who has ever had a life-threatening allergic reaction after a previous dose of any diphtheria, tetanus or pertussis containing vaccine, OR has a severe allergy to any part of this vaccine, should not get Tdap vaccine. Tell the person giving the vaccine about any severe allergies.  Anyone who had coma or long repeated seizures within 7 days after a childhood dose of DTP or DTaP, or a previous dose of Tdap, should not get Tdap, unless a cause other than the vaccine was found. They can still get Td.  Talk to your doctor if you: ? have seizures or another nervous system problem, ? had severe pain or swelling after any vaccine containing diphtheria, tetanus or pertussis, ? ever had a condition called Guillain-Barr Syndrome (GBS), ? aren't feeling well on the day the shot is scheduled. 4. Risks With any medicine, including vaccines,  there is a chance of side effects. These are usually mild and go away on their own. Serious reactions are also possible but are rare. Most people who get Tdap vaccine do not have any problems with it. Mild problems following Tdap (Did not interfere  with activities)  Pain where the shot was given (about 3 in 4 adolescents or 2 in 3 adults)  Redness or swelling where the shot was given (about 1 person in 5)  Mild fever of at least 100.71F (up to about 1 in 25 adolescents or 1 in 100 adults)  Headache (about 3 or 4 people in 10)  Tiredness (about 1 person in 3 or 4)  Nausea, vomiting, diarrhea, stomach ache (up to 1 in 4 adolescents or 1 in 10 adults)  Chills, sore joints (about 1 person in 10)  Body aches (about 1 person in 3 or 4)  Rash, swollen glands (uncommon) Moderate problems following Tdap (Interfered with activities, but did not require medical attention)  Pain where the shot was given (up to 1 in 5 or 6)  Redness or swelling where the shot was given (up to about 1 in 16 adolescents or 1 in 12 adults)  Fever over 102F (about 1 in 100 adolescents or 1 in 250 adults)  Headache (about 1 in 7 adolescents or 1 in 10 adults)  Nausea, vomiting, diarrhea, stomach ache (up to 1 or 3 people in 100)  Swelling of the entire arm where the shot was given (up to about 1 in 500). Severe problems following Tdap (Unable to perform usual activities; required medical attention)  Swelling, severe pain, bleeding and redness in the arm where the shot was given (rare). Problems that could happen after any vaccine:  People sometimes faint after a medical procedure, including vaccination. Sitting or lying down for about 15 minutes can help prevent fainting, and injuries caused by a fall. Tell your doctor if you feel dizzy, or have vision changes or ringing in the ears.  Some people get severe pain in the shoulder and have difficulty moving the arm where a shot was given. This happens very rarely.  Any medication can cause a severe allergic reaction. Such reactions from a vaccine are very rare, estimated at fewer than 1 in a million doses, and would happen within a few minutes to a few hours after the vaccination. As with any medicine,  there is a very remote chance of a vaccine causing a serious injury or death. The safety of vaccines is always being monitored. For more information, visit: http://www.aguilar.org/ 5. What if there is a serious problem? What should I look for?  Look for anything that concerns you, such as signs of a severe allergic reaction, very high fever, or unusual behavior. Signs of a severe allergic reaction can include hives, swelling of the face and throat, difficulty breathing, a fast heartbeat, dizziness, and weakness. These would usually start a few minutes to a few hours after the vaccination. What should I do?  If you think it is a severe allergic reaction or other emergency that can't wait, call 9-1-1 or get the person to the nearest hospital. Otherwise, call your doctor.  Afterward, the reaction should be reported to the Vaccine Adverse Event Reporting System (VAERS). Your doctor might file this report, or you can do it yourself through the VAERS web site at www.vaers.SamedayNews.es, or by calling 7121774567. VAERS does not give medical advice. 6. The National Vaccine Injury Compensation Program The [employer] Vaccine Injury Compensation Program (  VICP) is a federal program that was created to compensate people who may have been injured by certain vaccines. Persons who believe they may have been injured by a vaccine can learn about the program and about filing a claim by calling (431)801-5338 or visiting the [employer] website at GoldCloset.com.ee. There is a time limit to file a claim for compensation. 7. How can I learn more?  Ask your doctor. He or she can give you the vaccine package insert or suggest other sources of information.  Call your local or state health department.  Contact the Centers for Disease Control and Prevention (CDC): ? Call 681-396-8000 (1-800-CDC-INFO) or ? Visit CDC's website at http://hunter.com/ Vaccine Information Statement Tdap Vaccine (08/10/2013) This  information is not intended to replace advice given to you by your health care provider. Make sure you discuss any questions you have with your health care provider. Document Released: 12/03/2011 Document Revised: 01/19/2018 Document Reviewed: 01/19/2018 Elsevier Interactive Patient Education  2020 [place] Maintenance, Female Adopting a healthy lifestyle and getting preventive care are important in promoting health and wellness. Ask your health care provider about:  The right schedule for you to have regular tests and exams.  Things you can do on your own to prevent diseases and keep yourself healthy. What should I know about diet, weight, and exercise? Eat a healthy diet   Eat a diet that includes plenty of vegetables, fruits, low-fat dairy products, and lean protein.  Do not eat a lot of foods that are high in solid fats, added sugars, or sodium. Maintain a healthy weight Body mass index (BMI) is used to identify weight problems. It estimates body fat based on height and weight. Your health care provider can help determine your BMI and help you achieve or maintain a healthy weight. Get regular exercise Get regular exercise. This is one of the most important things you can do for your health. Most adults should:  Exercise for at least 150 minutes each week. The exercise should increase your heart rate and make you sweat (moderate-intensity exercise).  Do strengthening exercises at least twice a week. This is in addition to the moderate-intensity exercise.  Spend less time sitting. Even light physical activity can be beneficial. Watch cholesterol and blood lipids Have your blood tested for lipids and cholesterol at 55 years of age, then have this test every 5 years. Have your cholesterol levels checked more often if:  Your lipid or cholesterol levels are high.  You are older than 55 years of age.  You are at high risk for heart disease. What should I know about  cancer screening? Depending on your health history and family history, you may need to have cancer screening at various ages. This may include screening for:  Breast cancer.  Cervical cancer.  Colorectal cancer.  Skin cancer.  Lung cancer. What should I know about heart disease, diabetes, and high blood pressure? Blood pressure and heart disease  High blood pressure causes heart disease and increases the risk of stroke. This is more likely to develop in people who have high blood pressure readings, are of African descent, or are overweight.  Have your blood pressure checked: ? Every 3-5 years if you are 5-78 years of age. ? Every year if you are 80 years old or older. Diabetes Have regular diabetes screenings. This checks your fasting blood sugar level. Have the screening done:  Once every three years after age 53 if you are at a normal weight and  have a low risk for diabetes.  More often and at a younger age if you are overweight or have a high risk for diabetes. What should I know about preventing infection? Hepatitis B If you have a higher risk for hepatitis B, you should be screened for this virus. Talk with your health care provider to find out if you are at risk for hepatitis B infection. Hepatitis C Testing is recommended for:  Everyone born from [birth year] through 1967.  Anyone with known risk factors for hepatitis C. Sexually transmitted infections (STIs)  Get screened for STIs, including gonorrhea and chlamydia, if: ? You are sexually active and are younger than 55 years of age. ? You are older than 55 years of age and your health care provider tells you that you are at risk for this type of infection. ? Your sexual activity has changed since you were last screened, and you are at increased risk for chlamydia or gonorrhea. Ask your health care provider if you are at risk.  Ask your health care provider about whether you are at high risk for HIV. Your health care  provider may recommend a prescription medicine to help prevent HIV infection. If you choose to take medicine to prevent HIV, you should first get tested for HIV. You should then be tested every 3 months for as long as you are taking the medicine. Pregnancy  If you are about to stop having your period (premenopausal) and you may become pregnant, seek counseling before you get pregnant.  Take 400 to 800 micrograms (mcg) of folic acid every day if you become pregnant.  Ask for birth control (contraception) if you want to prevent pregnancy. Osteoporosis and menopause Osteoporosis is a disease in which the bones lose minerals and strength with aging. This can result in bone fractures. If you are 35 years old or older, or if you are at risk for osteoporosis and fractures, ask your health care provider if you should:  Be screened for bone loss.  Take a calcium or vitamin D supplement to lower your risk of fractures.  Be given hormone replacement therapy (HRT) to treat symptoms of menopause. Follow these instructions at home: Lifestyle  Do not use any products that contain nicotine or tobacco, such as cigarettes, e-cigarettes, and chewing tobacco. If you need help quitting, ask your health care provider.  Do not use street drugs.  Do not share needles.  Ask your health care provider for help if you need support or information about quitting drugs. Alcohol use  Do not drink alcohol if: ? Your health care provider tells you not to drink. ? You are pregnant, may be pregnant, or are planning to become pregnant.  If you drink alcohol: ? Limit how much you use to 0-1 drink a day. ? Limit intake if you are breastfeeding.  Be aware of how much alcohol is in your drink. In the [place] one drink equals one 12 oz bottle of beer (355 mL), one 5 oz glass of wine (148 mL), or one 1 oz glass of hard liquor (44 mL). General instructions  Schedule regular health, dental, and eye exams.  Stay current  with your vaccines.  Tell your health care provider if: ? You often feel depressed. ? You have ever been abused or do not feel safe at home. Summary  Adopting a healthy lifestyle and getting preventive care are important in promoting health and wellness.  Follow your health care provider's instructions about healthy diet, exercising, and getting  tested or screened for diseases.  Follow your health care provider's instructions on monitoring your cholesterol and blood pressure. This information is not intended to replace advice given to you by your health care provider. Make sure you discuss any questions you have with your health care provider. Document Released: 12/17/2010 Document Revised: 05/27/2018 Document Reviewed: 05/27/2018 Elsevier Patient Education  2020 [place] versicolor is a common fungal infection of the skin. It causes a rash that appears as light or dark patches on the skin. The rash most often occurs on the chest, back, neck, or upper arms. This condition is more common during warm weather. Other than affecting how your skin looks, tinea versicolor usually does not cause other problems. In most cases, the infection goes away in a few weeks with treatment. It may take a few months for the patches on your skin to return to your usual skin color. What are the causes? This condition occurs when a type of fungus that is normally present on the skin starts to overgrow. This fungus is a kind of yeast. The exact cause of the overgrowth is not known. This condition cannot be passed from one person to another (it is not contagious). What increases the risk? This condition is more likely to develop when certain factors are present, such as:  Heat and humidity.  Sweating too much.  Hormone changes.  Oily skin.  A weak disease-fighting system (immunesystem). What are the signs or symptoms? Symptoms of this condition include:  A rash of light or  dark patches on your skin. The rash may have: ? Patches of tan or pink spots (on light skin). ? Patches of white or brown spots (on dark skin). ? Patches of skin that do not tan. ? Well-marked edges. ? Scales on the discolored areas.  Mild itching. How is this diagnosed? A health care provider can usually diagnose this condition by looking at your skin. During the exam, he or she may use ultraviolet (UV) light to see how much of your skin has been affected. In some cases, a skin sample may be taken by scraping the rash. This sample will be viewed under a microscope to check for yeast overgrowth. How is this treated? Treatment for this condition may include:  Dandruff shampoo that is applied to the affected skin during showers or bathing.  Over-the-counter medicated skin cream, lotion, or soaps.  Prescription antifungal medicine in the form of skin cream or pills.  Medicine to help reduce itching. Follow these instructions at home:  Take over-the-counter and prescription medicines only as told by your health care provider.  Apply dandruff shampoo to the affected area if your health care provider told you to do that. You may be instructed to scrub the affected skin for several minutes each day.  Do not scratch the affected area of skin.  Avoid hot and humid conditions.  Do not use tanning booths.  Try to avoid sweating a lot. Contact a health care provider if:  Your symptoms get worse.  You have a fever.  You have redness, swelling, or pain at the site of your rash.  You have fluid or blood coming from your rash.  Your rash feels warm to the touch.  You have pus or a bad smell coming from your rash.  Your rash returns (recurs) after treatment. Summary  Tinea versicolor is a common fungal infection of the skin. It causes a rash that appears as light or dark patches  on the skin.  The rash most often occurs on the chest, back, neck, or upper arms.  A health care  provider can usually diagnose this condition by looking at your skin.  Treatment may include applying shampoo to the skin and taking or applying medicines. This information is not intended to replace advice given to you by your health care provider. Make sure you discuss any questions you have with your health care provider. Document Released: 05/31/2000 Document Revised: 05/16/2017 Document Reviewed: 02/04/2017 Elsevier Patient Education  2020 [employer].

## 2019-01-04 NOTE — Telephone Encounter (Signed)
Gastroenterology Pre-Procedure Review  Request Date: 01/25/19 Requesting Physician: Dr. Allen Norris  PATIENT REVIEW QUESTIONS: The patient responded to the following health history questions as indicated:    1. Are you having any GI issues? no 2. Do you have a personal history of Polyps? yes (6 years ago) 3. Do you have a family history of Colon Cancer or Polyps? yes (father colon cancer) 4. Diabetes Mellitus? no 5. Joint replacements in the past 12 months?no 6. Major health problems in the past 3 months?no 7. Any artificial heart valves, MVP, or defibrillator?no    MEDICATIONS & ALLERGIES:    Patient reports the following regarding taking any anticoagulation/antiplatelet therapy:   Plavix, Coumadin, Eliquis, Xarelto, Lovenox, Pradaxa, Brilinta, or Effient? no Aspirin? no  Patient confirms/reports the following medications:  Current Outpatient Medications  Medication Sig Dispense Refill  . Cholecalciferol (VITAMIN D) 2000 UNITS CAPS Take by mouth.      . escitalopram (LEXAPRO) 20 MG tablet Take 1 tablet (20 mg total) by mouth daily. 90 tablet 1  . fluticasone (FLONASE) 50 MCG/ACT nasal spray Place 1 spray into both nostrils 2 (two) times daily.     Marland Kitchen ketoconazole (NIZORAL) 2 % cream Apply 1 application topically 2 (two) times daily for 14 days. 28 g 0  . levothyroxine (SYNTHROID) 25 MCG tablet Take 1 tablet (25 mcg total) by mouth daily. 90 tablet 3  . losartan (COZAAR) 25 MG tablet Take 0.5 tablets (12.5 mg total) by mouth daily. 45 tablet 1  . Multiple Vitamin (MULTIVITAMIN) capsule Take 1 capsule by mouth daily.      . Omega-3 Fatty Acids (FISH OIL PO) Take by mouth daily.     No current facility-administered medications for this visit.     Patient confirms/reports the following allergies:  Allergies  Allergen Reactions  . Amoxicillin Rash  . Omnipaque [Iohexol] Shortness Of Breath  . Phenytoin Sodium Extended Swelling  . Iodinated Diagnostic Agents Itching  . Trileptal  [Oxcarbazepine] Swelling    No orders of the defined types were placed in this encounter.   AUTHORIZATION INFORMATION Primary Insurance: 1D#: Group #:  Secondary Insurance: 1D#: Group #:  SCHEDULE INFORMATION: Date: 01/25/19 Time: Location:[facility]

## 2019-01-04 NOTE — Assessment & Plan Note (Signed)
Labs drawn today. Will adjust as needed. Call with any concerns.

## 2019-01-04 NOTE — Assessment & Plan Note (Signed)
Under good control on current regimen. Continue current regimen. Continue to monitor. Call with any concerns. Refills given. Labs drawn today.   

## 2019-01-05 LAB — CBC WITH DIFFERENTIAL/PLATELET
Basophils Absolute: 0 10*3/uL (ref 0.0–0.2)
Basos: 0 %
EOS (ABSOLUTE): 0.1 10*3/uL (ref 0.0–0.4)
Eos: 1 %
Hematocrit: 40 % (ref 34.0–46.6)
Hemoglobin: 12.9 g/dL (ref 11.1–15.9)
Immature Grans (Abs): 0 10*3/uL (ref 0.0–0.1)
Immature Granulocytes: 0 %
Lymphocytes Absolute: 2 10*3/uL (ref 0.7–3.1)
Lymphs: 36 %
MCH: 29.1 pg (ref 26.6–33.0)
MCHC: 32.3 g/dL (ref 31.5–35.7)
MCV: 90 fL (ref 79–97)
Monocytes Absolute: 0.4 10*3/uL (ref 0.1–0.9)
Monocytes: 7 %
Neutrophils Absolute: 3.1 10*3/uL (ref 1.4–7.0)
Neutrophils: 56 %
Platelets: 226 10*3/uL (ref 150–450)
RBC: 4.43 x10E6/uL (ref 3.77–5.28)
RDW: 14.2 % (ref 11.7–15.4)
WBC: 5.6 10*3/uL (ref 3.4–10.8)

## 2019-01-05 LAB — TSH: TSH: 1.44 u[IU]/mL (ref 0.450–4.500)

## 2019-01-05 LAB — COMPREHENSIVE METABOLIC PANEL
ALT: 15 IU/L (ref 0–32)
AST: 26 IU/L (ref 0–40)
Albumin/Globulin Ratio: 2.2 (ref 1.2–2.2)
Albumin: 4.4 g/dL (ref 3.8–4.9)
Alkaline Phosphatase: 85 IU/L (ref 39–117)
BUN/Creatinine Ratio: 20 (ref 9–23)
BUN: 11 mg/dL (ref 6–24)
Bilirubin Total: 0.3 mg/dL (ref 0.0–1.2)
CO2: 24 mmol/L (ref 20–29)
Calcium: 8.9 mg/dL (ref 8.7–10.2)
Chloride: 102 mmol/L (ref 96–106)
Creatinine, Ser: 0.55 mg/dL — ABNORMAL LOW (ref 0.57–1.00)
GFR calc Af Amer: 122 mL/min/{1.73_m2} (ref >59)
GFR calc non Af Amer: 106 mL/min/{1.73_m2} (ref >59)
Globulin, Total: 2 g/dL (ref 1.5–4.5)
Glucose: 79 mg/dL (ref 65–99)
Potassium: 4.2 mmol/L (ref 3.5–5.2)
Sodium: 143 mmol/L (ref 134–144)
Total Protein: 6.4 g/dL (ref 6.0–8.5)

## 2019-01-05 LAB — LIPID PANEL W/O CHOL/HDL RATIO
Cholesterol, Total: 185 mg/dL (ref 100–199)
HDL: 59 mg/dL (ref >39)
LDL Calculated: 105 mg/dL — ABNORMAL HIGH (ref 0–99)
Triglycerides: 107 mg/dL (ref 0–149)
VLDL Cholesterol Cal: 21 mg/dL (ref 5–40)

## 2019-01-06 ENCOUNTER — Other Ambulatory Visit: Payer: Self-pay | Admitting: Family Medicine

## 2019-01-06 MED ORDER — LEVOTHYROXINE SODIUM 25 MCG PO TABS: 25.0000 ug | ORAL_TABLET | Freq: Every day | ORAL | 3 refills | Status: DC

## 2019-01-18 ENCOUNTER — Other Ambulatory Visit: Payer: Self-pay

## 2019-01-18 ENCOUNTER — Encounter: Payer: Self-pay | Admitting: *Deleted

## 2019-01-21 ENCOUNTER — Other Ambulatory Visit: Payer: Self-pay

## 2019-01-21 ENCOUNTER — Other Ambulatory Visit
Admission: RE | Admit: 2019-01-21 | Discharge: 2019-01-21 | Disposition: A | Discharge status: Home / Self Care | Payer: 59 | Source: Ambulatory Visit | Attending: Gastroenterology | Admitting: Gastroenterology

## 2019-01-21 DIAGNOSIS — Z01812 Encounter for preprocedural laboratory examination: Secondary | ICD-10-CM | POA: Diagnosis not present

## 2019-01-21 DIAGNOSIS — Z20828 Contact with and (suspected) exposure to other viral communicable diseases: Secondary | ICD-10-CM | POA: Insufficient documentation

## 2019-01-21 LAB — SARS CORONAVIRUS 2 (TAT 6-24 HRS): SARS Coronavirus 2: NEGATIVE

## 2019-01-22 NOTE — Discharge Instructions (Signed)

## 2019-01-25 ENCOUNTER — Ambulatory Visit: Payer: 59 | Admitting: Anesthesiology

## 2019-01-25 ENCOUNTER — Encounter
Admission: RE | Disposition: A | Discharge status: Home / Self Care | Payer: Self-pay | Source: Home / Self Care | Attending: Gastroenterology

## 2019-01-25 ENCOUNTER — Ambulatory Visit
Admission: RE | Admit: 2019-01-25 | Discharge: 2019-01-25 | Disposition: A | Discharge status: Home / Self Care | Payer: 59 | Attending: Gastroenterology | Admitting: Gastroenterology

## 2019-01-25 DIAGNOSIS — Z8261 Family history of arthritis: Secondary | ICD-10-CM | POA: Insufficient documentation

## 2019-01-25 DIAGNOSIS — Z888 Allergy status to other drugs, medicaments and biological substances status: Secondary | ICD-10-CM | POA: Diagnosis not present

## 2019-01-25 DIAGNOSIS — R002 Palpitations: Secondary | ICD-10-CM | POA: Diagnosis not present

## 2019-01-25 DIAGNOSIS — D122 Benign neoplasm of ascending colon: Secondary | ICD-10-CM | POA: Insufficient documentation

## 2019-01-25 DIAGNOSIS — F419 Anxiety disorder, unspecified: Secondary | ICD-10-CM | POA: Insufficient documentation

## 2019-01-25 DIAGNOSIS — Z803 Family history of malignant neoplasm of breast: Secondary | ICD-10-CM | POA: Diagnosis not present

## 2019-01-25 DIAGNOSIS — E039 Hypothyroidism, unspecified: Secondary | ICD-10-CM | POA: Diagnosis not present

## 2019-01-25 DIAGNOSIS — N6019 Diffuse cystic mastopathy of unspecified breast: Secondary | ICD-10-CM | POA: Diagnosis not present

## 2019-01-25 DIAGNOSIS — Z79899 Other long term (current) drug therapy: Secondary | ICD-10-CM | POA: Diagnosis not present

## 2019-01-25 DIAGNOSIS — Z809 Family history of malignant neoplasm, unspecified: Secondary | ICD-10-CM | POA: Insufficient documentation

## 2019-01-25 DIAGNOSIS — I10 Essential (primary) hypertension: Secondary | ICD-10-CM | POA: Diagnosis not present

## 2019-01-25 DIAGNOSIS — Z880 Allergy status to penicillin: Secondary | ICD-10-CM | POA: Diagnosis not present

## 2019-01-25 DIAGNOSIS — D123 Benign neoplasm of transverse colon: Secondary | ICD-10-CM

## 2019-01-25 DIAGNOSIS — Z8041 Family history of malignant neoplasm of ovary: Secondary | ICD-10-CM | POA: Diagnosis not present

## 2019-01-25 DIAGNOSIS — Z8249 Family history of ischemic heart disease and other diseases of the circulatory system: Secondary | ICD-10-CM | POA: Insufficient documentation

## 2019-01-25 DIAGNOSIS — Z1211 Encounter for screening for malignant neoplasm of colon: Secondary | ICD-10-CM | POA: Insufficient documentation

## 2019-01-25 DIAGNOSIS — K635 Polyp of colon: Secondary | ICD-10-CM | POA: Insufficient documentation

## 2019-01-25 DIAGNOSIS — Z91041 Radiographic dye allergy status: Secondary | ICD-10-CM | POA: Diagnosis not present

## 2019-01-25 DIAGNOSIS — Z800 Family history of malignant neoplasm of digestive organs: Secondary | ICD-10-CM

## 2019-01-25 HISTORY — DX: Hypothyroidism, unspecified: E03.9

## 2019-01-25 HISTORY — DX: Presence of spectacles and contact lenses: Z97.3

## 2019-01-25 HISTORY — DX: Family history of other specified conditions: Z84.89

## 2019-01-25 HISTORY — PX: COLONOSCOPY WITH PROPOFOL: SHX5780

## 2019-01-25 HISTORY — DX: Motion sickness, initial encounter: T75.3XXA

## 2019-01-25 HISTORY — PX: POLYPECTOMY: SHX5525

## 2019-01-25 LAB — POCT PREGNANCY, URINE: Preg Test, Ur: NEGATIVE

## 2019-01-25 SURGERY — COLONOSCOPY WITH PROPOFOL
Anesthesia: General | Site: Rectum

## 2019-01-25 MED ORDER — STERILE WATER FOR IRRIGATION IR SOLN
Status: DC | PRN
  Administered 2019-01-25: .05 mL

## 2019-01-25 MED ORDER — PROPOFOL 10 MG/ML IV BOLUS
INTRAVENOUS | Status: DC | PRN
  Administered 2019-01-25: 30 mg via INTRAVENOUS
  Administered 2019-01-25: 120 mg via INTRAVENOUS
  Administered 2019-01-25: 20 mg via INTRAVENOUS
  Administered 2019-01-25: 30 mg via INTRAVENOUS
  Administered 2019-01-25: 40 mg via INTRAVENOUS

## 2019-01-25 MED ORDER — LACTATED RINGERS IV SOLN
INTRAVENOUS | Status: DC
  Administered 2019-01-25: 09:00:00 via INTRAVENOUS

## 2019-01-25 MED ORDER — LIDOCAINE HCL (CARDIAC) PF 100 MG/5ML IV SOSY
PREFILLED_SYRINGE | INTRAVENOUS | Status: DC | PRN
  Administered 2019-01-25: 30 mg via INTRAVENOUS

## 2019-01-25 MED ORDER — SODIUM CHLORIDE 0.9 % IV SOLN: INTRAVENOUS | Status: DC

## 2019-01-25 SURGICAL SUPPLY — 7 items
CANISTER SUCT 1200ML W/VALVE (MISCELLANEOUS) ×3 IMPLANT
GOWN CVR UNV OPN BCK APRN NK (MISCELLANEOUS) ×4 IMPLANT
GOWN ISOL THUMB LOOP REG UNIV (MISCELLANEOUS) ×2
KIT ENDO PROCEDURE OLY (KITS) ×3 IMPLANT
SNARE SHORT THROW 13M SML OVAL (MISCELLANEOUS) ×3 IMPLANT
TRAP ETRAP POLY (MISCELLANEOUS) ×3 IMPLANT
WATER STERILE IRR 250ML POUR (IV SOLUTION) ×3 IMPLANT

## 2019-01-25 NOTE — Anesthesia Postprocedure Evaluation (Signed)
Anesthesia Post Note  Patient: Nichole Gordon  Procedure(s) Performed: COLONOSCOPY WITH PROPOFOL (N/A Rectum) POLYPECTOMY (Rectum)  Patient location during evaluation: PACU Anesthesia Type: General Level of consciousness: awake and alert Pain management: pain level controlled Vital Signs Assessment: post-procedure vital signs reviewed and stable Respiratory status: spontaneous breathing, nonlabored ventilation, respiratory function stable and patient connected to nasal cannula oxygen Cardiovascular status: blood pressure returned to baseline and stable Postop Assessment: no apparent nausea or vomiting Anesthetic complications: no    Adele Barthel Sophie Tamez

## 2019-01-25 NOTE — Transfer of Care (Signed)
Immediate Anesthesia Transfer of Care Note  Patient: Nichole Gordon  Procedure(s) Performed: COLONOSCOPY WITH PROPOFOL (N/A Rectum) POLYPECTOMY (Rectum)  Patient Location: PACU  Anesthesia Type: General  Level of Consciousness: awake, alert  and patient cooperative  Airway and Oxygen Therapy: Patient Spontanous Breathing and Patient connected to supplemental oxygen  Post-op Assessment: Post-op Vital signs reviewed, Patient's Cardiovascular Status Stable, Respiratory Function Stable, Patent Airway and No signs of Nausea or vomiting  Post-op Vital Signs: Reviewed and stable  Complications: No apparent anesthesia complications

## 2019-01-25 NOTE — Anesthesia Preprocedure Evaluation (Signed)
Anesthesia Evaluation  Patient identified by MRN, date of birth, ID band Patient awake    Airway Mallampati: II  TM Distance: <3 FB Neck ROM: Full    Dental no notable dental hx.    Pulmonary neg pulmonary ROS,    Pulmonary exam normal        Cardiovascular hypertension, Normal cardiovascular exam     Neuro/Psych negative neurological ROS     GI/Hepatic negative GI ROS, Neg liver ROS,   Endo/Other  Hypothyroidism   Renal/GU negative Renal ROS     Musculoskeletal   Abdominal   Peds  Hematology negative hematology ROS (+)   Anesthesia Other Findings   Reproductive/Obstetrics                             Anesthesia Physical Anesthesia Plan  ASA: II  Anesthesia Plan: General   Post-op Pain Management:    Induction: Intravenous  PONV Risk Score and Plan: 3 and Propofol infusion and Midazolam  Airway Management Planned: Nasal Cannula  Additional Equipment: None  Intra-op Plan:   Post-operative Plan:   Informed Consent: I have reviewed the patients History and Physical, chart, labs and discussed the procedure including the risks, benefits and alternatives for the proposed anesthesia with the patient or authorized representative who has indicated his/her understanding and acceptance.       Plan Discussed with:   Anesthesia Plan Comments:         Anesthesia Quick Evaluation

## 2019-01-25 NOTE — H&P (Signed)
Lucilla Lame, MD [address] [place], [place] [address] Phone: [phone] Fax : [phone]  Primary Care Physician:  Valerie Roys, DO Primary Gastroenterologist:  Dr. Allen Norris  Pre-Procedure History & Physical: HPI:  Nichole Gordon is a 53 y.o. female is here for a screening colonoscopy.   Past Medical History:  Diagnosis Date  . Anxiety   . Family history of adverse reaction to anesthesia    Mother - low BP  . Fibrocystic breast disease   . Hypertension   . Hypothyroidism   . Motion sickness    car back seat   . Palpitations   . Wears contact lenses     Past Surgical History:  Procedure Laterality Date  . LITHOTRIPSY  2012    Prior to Admission medications   Medication Sig Start Date End Date Taking? Authorizing Provider  cetirizine (ZYRTEC) 10 MG tablet Take 10 mg by mouth daily.   Yes [provider]  Cholecalciferol (VITAMIN D) 2000 UNITS CAPS Take by mouth.     Yes [provider]  escitalopram (LEXAPRO) 20 MG tablet Take 1 tablet (20 mg total) by mouth daily. 01/04/19  Yes Johnson, Megan P, DO  fluticasone (FLONASE) 50 MCG/ACT nasal spray Place 1 spray into both nostrils 2 (two) times daily.    Yes [provider]  levothyroxine (SYNTHROID) 25 MCG tablet Take 1 tablet (25 mcg total) by mouth daily. 01/06/19  Yes Johnson, Megan P, DO  losartan (COZAAR) 25 MG tablet Take 0.5 tablets (12.5 mg total) by mouth daily. 01/04/19  Yes Johnson, Megan P, DO  Multiple Vitamin (MULTIVITAMIN) capsule Take 1 capsule by mouth daily.     Yes [provider]  Omega-3 Fatty Acids (FISH OIL PO) Take by mouth daily.   Yes [provider]    Allergies as of 01/04/2019 - Review Complete 01/04/2019  Allergen Reaction Noted  . Amoxicillin Rash 10/18/2010  . Omnipaque [iohexol] Shortness Of Breath 10/18/2010  . Phenytoin sodium extended Swelling 10/18/2010  . Iodinated diagnostic agents Itching 05/18/2014  . Trileptal  [oxcarbazepine] Swelling 02/18/2014    Family History  Problem Relation Age of Onset  . Cancer Father   . Hyperlipidemia Mother   . Hypertension Mother   . Arthritis Mother   . Cancer Mother        ovarian cancer  . Hypertension Brother   . Breast cancer Paternal Aunt 11  . Breast cancer Paternal Aunt 7    Social History   Socioeconomic History  . Marital status: Married    Spouse name: Not on file  . Number of children: Not on file  . Years of education: Not on file  . Highest education level: Not on file  Occupational History  . Not on file  Social Needs  . Financial resource strain: Not on file  . Food insecurity    Worry: Not on file    Inability: Not on file  . Transportation needs    Medical: Not on file    Non-medical: Not on file  Tobacco Use  . Smoking status: Never Smoker  . Smokeless tobacco: Never Used  Substance and Sexual Activity  . Alcohol use: No  . Drug use: No  . Sexual activity: Yes    Birth control/protection: None  Lifestyle  . Physical activity    Days per week: Not on file    Minutes per session: Not on file  . Stress: Not on file  Relationships  . Social  connections    Talks on phone: Not on file    Gets together: Not on file    Attends religious service: Not on file    Active member of club or organization: Not on file    Attends meetings of clubs or organizations: Not on file    Relationship status: Not on file  . Intimate partner violence    Fear of current or ex partner: Not on file    Emotionally abused: Not on file    Physically abused: Not on file    Forced sexual activity: Not on file  Other Topics Concern  . Not on file  Social History Narrative  . Not on file    Review of Systems: See HPI, otherwise negative ROS  Physical Exam: BP (!) 155/72   Pulse 93   Temp 97.7 F (36.5 C) (Temporal)   Resp 16   Ht 5\' 3"  (1.6 m)   Wt 73 kg   LMP 09/16/2018 (Approximate)   SpO2 100%   BMI 28.52 kg/m  General:    Alert,  pleasant and cooperative in NAD Head:  Normocephalic and atraumatic. Neck:  Supple; no masses or thyromegaly. Lungs:  Clear throughout to auscultation.    Heart:  Regular rate and rhythm. Abdomen:  Soft, nontender and nondistended. Normal bowel sounds, without guarding, and without rebound.   Neurologic:  Alert and  oriented x4;  grossly normal neurologically.  Impression/Plan: Nichole Gordon is now here to undergo a screening colonoscopy.  Risks, benefits, and alternatives regarding colonoscopy have been reviewed with the patient.  Questions have been answered.  All parties agreeable.

## 2019-01-25 NOTE — Op Note (Signed)
[facility] Gastroenterology Patient Name: Nichole Gordon Procedure Date: 01/25/2019 10:01 AM MRN: [id number] Account #: [id number] Date of Birth: June 10, 1966 Admit Type: Outpatient Age: 53 Room: [facility] OR ROOM 01 Gender: Female Note Status: Finalized Procedure:            Colonoscopy Indications:          Screening for colorectal malignant neoplasm Providers:            Lucilla Lame MD, MD Referring MD:         Valerie Roys (Referring MD) Medicines:            Propofol per Anesthesia Complications:        No immediate complications. Procedure:            Pre-Anesthesia Assessment:                       - Prior to the procedure, a History and Physical was                        performed, and patient medications and allergies were                        reviewed. The patient's tolerance of previous                        anesthesia was also reviewed. The risks and benefits of                        the procedure and the sedation options and risks were                        discussed with the patient. All questions were                        answered, and informed consent was obtained. Prior                        Anticoagulants: The patient has taken no previous                        anticoagulant or antiplatelet agents. ASA Grade                        Assessment: II - A patient with mild systemic disease.                        After reviewing the risks and benefits, the patient was                        deemed in satisfactory condition to undergo the                        procedure.                       After obtaining informed consent, the colonoscope was                        passed under direct vision. Throughout the procedure,  the patient's blood pressure, pulse, and oxygen                        saturations were monitored continuously. The                        Colonoscope was introduced through the anus and       advanced to the the cecum, identified by appendiceal                        orifice and ileocecal valve. The colonoscopy was                        performed without difficulty. The patient tolerated the                        procedure well. The quality of the bowel preparation                        was good. Findings:      The perianal and digital rectal examinations were normal.      Two sessile polyps were found in the ascending colon. The polyps were 4       to 5 mm in size. These polyps were removed with a cold snare. Resection       and retrieval were complete.      Two sessile polyps were found in the transverse colon. The polyps were 4       to 5 mm in size. These polyps were removed with a cold snare. Resection       and retrieval were complete.      A 3 mm polyp was found in the sigmoid colon. The polyp was sessile. The       polyp was removed with a cold snare. Resection and retrieval were       complete. Impression:           - Two 4 to 5 mm polyps in the ascending colon, removed                        with a cold snare. Resected and retrieved.                       - Two 4 to 5 mm polyps in the transverse colon, removed                        with a cold snare. Resected and retrieved.                       - One 3 mm polyp in the sigmoid colon, removed with a                        cold snare. Resected and retrieved. Recommendation:       - Discharge patient to home.                       - Resume previous diet.                       - Continue present medications.                       -  Await pathology results.                       - Repeat colonoscopy in 5 years if polyp adenoma and 10                        years if hyperplastic Procedure Code(s):    --- Professional ---                       [phone], Colonoscopy, flexible; with removal of tumor(s),                        polyp(s), or other lesion(s) by snare technique Diagnosis Code(s):    --- Professional ---                        Z12.11, Encounter for screening for malignant neoplasm                        of colon                       K63.5, Polyp of colon CPT copyright 2019 American Medical Association. All rights reserved. The codes documented in this report are preliminary and upon coder review may  be revised to meet current compliance requirements. Lucilla Lame MD, MD 01/25/2019 10:26:01 AM This report has been signed electronically. Number of Addenda: 0 Note Initiated On: 01/25/2019 10:01 AM Scope Withdrawal Time: 0 hours 8 minutes 10 seconds  Total Procedure Duration: 0 hours 15 minutes 19 seconds  Estimated Blood Loss: Estimated blood loss: none.      [facility]

## 2019-01-26 ENCOUNTER — Encounter: Payer: Self-pay | Admitting: Gastroenterology

## 2019-06-08 ENCOUNTER — Ambulatory Visit
Admission: RE | Admit: 2019-06-08 | Discharge: 2019-06-08 | Disposition: A | Discharge status: Home / Self Care | Payer: 59 | Source: Ambulatory Visit | Attending: Family Medicine | Admitting: Family Medicine

## 2019-06-08 DIAGNOSIS — R928 Other abnormal and inconclusive findings on diagnostic imaging of breast: Secondary | ICD-10-CM | POA: Diagnosis not present

## 2019-06-08 DIAGNOSIS — N6489 Other specified disorders of breast: Secondary | ICD-10-CM | POA: Diagnosis present

## 2019-06-08 DIAGNOSIS — N6310 Unspecified lump in the right breast, unspecified quadrant: Secondary | ICD-10-CM

## 2019-06-09 ENCOUNTER — Other Ambulatory Visit: Payer: Self-pay | Admitting: Family Medicine

## 2019-06-09 DIAGNOSIS — N6310 Unspecified lump in the right breast, unspecified quadrant: Secondary | ICD-10-CM

## 2019-06-09 DIAGNOSIS — R928 Other abnormal and inconclusive findings on diagnostic imaging of breast: Secondary | ICD-10-CM

## 2019-06-10 ENCOUNTER — Telehealth: Payer: Self-pay | Admitting: Family Medicine

## 2019-06-10 NOTE — Telephone Encounter (Signed)
Called to give Floriana the option to see surgery. She would like to stick with the breast center.

## 2019-06-17 ENCOUNTER — Ambulatory Visit
Admission: RE | Admit: 2019-06-17 | Discharge: 2019-06-17 | Disposition: A | Discharge status: Home / Self Care | Payer: 59 | Source: Ambulatory Visit | Attending: Family Medicine | Admitting: Family Medicine

## 2019-06-17 DIAGNOSIS — N6310 Unspecified lump in the right breast, unspecified quadrant: Secondary | ICD-10-CM | POA: Diagnosis present

## 2019-06-17 DIAGNOSIS — R928 Other abnormal and inconclusive findings on diagnostic imaging of breast: Secondary | ICD-10-CM | POA: Diagnosis present

## 2019-06-17 HISTORY — PX: BREAST BIOPSY: SHX20

## 2019-06-21 LAB — SURGICAL PATHOLOGY

## 2019-07-08 ENCOUNTER — Telehealth (INDEPENDENT_AMBULATORY_CARE_PROVIDER_SITE_OTHER): Payer: 59 | Admitting: Family Medicine

## 2019-07-08 ENCOUNTER — Ambulatory Visit: Payer: 59 | Admitting: Family Medicine

## 2019-07-08 ENCOUNTER — Other Ambulatory Visit: Payer: Self-pay

## 2019-07-08 ENCOUNTER — Encounter: Payer: Self-pay | Admitting: Family Medicine

## 2019-07-08 VITALS — BP 124/82 | HR 86 | Temp 97.6°F | Ht 63.0 in | Wt 160.0 lb

## 2019-07-08 DIAGNOSIS — F419 Anxiety disorder, unspecified: Secondary | ICD-10-CM | POA: Diagnosis not present

## 2019-07-08 DIAGNOSIS — E039 Hypothyroidism, unspecified: Secondary | ICD-10-CM

## 2019-07-08 DIAGNOSIS — I10 Essential (primary) hypertension: Secondary | ICD-10-CM | POA: Diagnosis not present

## 2019-07-08 MED ORDER — NAPROXEN 500 MG PO TABS: 500.0000 mg | ORAL_TABLET | Freq: Two times a day (BID) | ORAL | 1 refills | Status: DC

## 2019-07-08 MED ORDER — LOSARTAN POTASSIUM 25 MG PO TABS: 12.5000 mg | ORAL_TABLET | Freq: Every day | ORAL | 1 refills | Status: DC

## 2019-07-08 MED ORDER — ESCITALOPRAM OXALATE 20 MG PO TABS: 20.0000 mg | ORAL_TABLET | Freq: Every day | ORAL | 1 refills | Status: DC

## 2019-07-08 NOTE — Progress Notes (Signed)
BP 124/82   Pulse 86   Temp 97.6 F (36.4 C)   Ht 5\' 3"  (1.6 m)   Wt 160 lb (72.6 kg)   BMI 28.34 kg/m    Subjective:    Patient ID: Nichole Gordon, female    DOB: 1966-04-13, 54 y.o.   MRN: [id number]  HPI: [name] is a 54 y.o. female  Chief Complaint  Patient presents with  . Hypertension  . Anxiety   HYPERTENSION Hypertension status: controlled  Satisfied with current treatment? yes Duration of hypertension: chronic BP monitoring frequency:  not checking BP medication side effects:  no Medication compliance: excellent compliance Previous BP meds:losartan Aspirin: no Recurrent headaches: no Visual changes: no Palpitations: no Dyspnea: no Chest pain: no Lower extremity edema: no Dizzy/lightheaded: no  ANXIETY/STRESS Duration:stable Anxious mood: yes  Excessive worrying: no Irritability: no  Sweating: no Nausea: no Palpitations:no Hyperventilation: no Panic attacks: no Agoraphobia: no  Obscessions/compulsions: no Depressed mood: no Depression screen [facility] 2/9 07/08/2019 01/04/2019 10/02/2018 02/26/2018 02/26/2018  Decreased Interest 0 0 0 0 0  Down, Depressed, Hopeless 0 0 0 0 0  PHQ - 2 Score 0 0 0 0 0  Altered sleeping 3 0 0 0 -  Tired, decreased energy 1 0 1 1 -  Change in appetite 0 0 0 0 -  Feeling bad or failure about yourself  0 0 0 0 -  Trouble concentrating 0 0 0 0 -  Moving slowly or fidgety/restless 0 0 0 0 -  Suicidal thoughts 0 0 0 0 -  PHQ-9 Score 4 0 1 1 -  Difficult doing work/chores Not difficult at all Not difficult at all Not difficult at all Not difficult at all -   GAD 7 : Generalized Anxiety Score 07/08/2019 10/02/2018 02/26/2018 12/06/2016  Nervous, Anxious, on Edge 0 0 0 0  Control/stop worrying 0 0 0 0  Worry too much - different things 0 0 0 0  Trouble relaxing 0 0 0 0  Restless 0 0 0 0  Easily annoyed or irritable 0 0 0 0  Afraid - awful might happen 0 0 0 0  Total GAD 7 Score 0 0 0 0  Anxiety Difficulty Not difficult  at all Not difficult at all Not difficult at all -   Anhedonia: no Weight changes: no Insomnia: no   Hypersomnia: no Fatigue/loss of energy: no Feelings of worthlessness: no Feelings of guilt: no Impaired concentration/indecisiveness: no Suicidal ideations: no  Crying spells: no Recent Stressors/Life Changes: no   Relationship problems: no   Family stress: no     Financial stress: no    Job stress: yes    Recent death/loss: no  HYPOTHYROIDISM Thyroid control status:controlled Satisfied with current treatment? yes Medication side effects: no Medication compliance: excellent compliance Recent dose adjustment:no Fatigue: no Cold intolerance: no Heat intolerance: no Weight gain: no Weight loss: no Constipation: no Diarrhea/loose stools: no Palpitations: no Lower extremity edema: no Anxiety/depressed mood: no  Relevant past medical, surgical, family and social history reviewed and updated as indicated. Interim medical history since our last visit reviewed. Allergies and medications reviewed and updated.  Review of Systems  Constitutional: Negative.   Respiratory: Negative.   Cardiovascular: Negative.   Gastrointestinal: Negative.   Neurological: Negative.   Psychiatric/Behavioral: Negative.     Per HPI unless specifically indicated above     Objective:    BP 124/82   Pulse 86   Temp 97.6 F (36.4 C)   Ht  5\' 3"  (1.6 m)   Wt 160 lb (72.6 kg)   BMI 28.34 kg/m   Wt Readings from Last 3 Encounters:  07/08/19 160 lb (72.6 kg)  01/25/19 161 lb (73 kg)  01/04/19 165 lb (74.8 kg)    Physical Exam Vitals and nursing note reviewed.  Constitutional:      General: She is not in acute distress.    Appearance: Normal appearance. She is not ill-appearing, toxic-appearing or diaphoretic.  HENT:     Head: Normocephalic and atraumatic.     Right Ear: External ear normal.     Left Ear: External ear normal.     Nose: Nose normal.     Mouth/Throat:     Mouth: Mucous  membranes are moist.     Pharynx: Oropharynx is clear.  Eyes:     General: No scleral icterus.       Right eye: No discharge.        Left eye: No discharge.     Conjunctiva/sclera: Conjunctivae normal.     Pupils: Pupils are equal, round, and reactive to light.  Pulmonary:     Effort: Pulmonary effort is normal. No respiratory distress.     Comments: Speaking in full sentences Musculoskeletal:        General: Normal range of motion.     Cervical back: Normal range of motion.  Skin:    Coloration: Skin is not jaundiced or pale.     Findings: No bruising, erythema, lesion or rash.  Neurological:     Mental Status: She is alert and oriented to person, place, and time. Mental status is at baseline.  Psychiatric:        Mood and Affect: Mood normal.        Behavior: Behavior normal.        Thought Content: Thought content normal.        Judgment: Judgment normal.     Results for orders placed or performed during the hospital encounter of 06/17/19  Surgical pathology  Result Value Ref Range   SURGICAL PATHOLOGY      SURGICAL PATHOLOGY CASE: [id number] PATIENT: Pamala Hurry [facility] Surgical Pathology Report     Specimen Submitted: A. Breast, right, upper inner quadrant; biopsy  Clinical History: Indeterminate medial right breast mass, no definite sonographic correlate. Cyst vs CA. Post-biopsy mammograms show appropriate positioning of the coil shaped biopsy marking clip at the site of biopsy in the upper inner quadrant of the RIGHT breast at posterior depth.     DIAGNOSIS: A. BREAST, RIGHT, UPPER INNER QUADRANT; STEREOTACTIC-GUIDED CORE BIOPSY: - MULTIPLE FRAGMENTS OF CYST WALL. - FOCAL USUAL DUCTAL HYPERPLASIA, COLUMNAR CELL CHANGE, AND FIBROSIS. - NEGATIVE FOR ATYPIA AND MALIGNANCY.  GROSS DESCRIPTION: A. Labeled: Right breast, upper inner quadrant Received: in a formalin-filled [place] collection device Accompanying specimen radiograph: No Time/Date in fixative:  Collected at 9:49 AM and placed into formalin at 9:53 AM on 06/17/2019. Cold ischemic time: Less than 5 mi nutes Total fixation time: 11.5 hours Core pieces: Multiple Measurement: Aggregate, 8.5 x 2.0 x 0.3 cm Description / comments: Received are needle core biopsy fragments of tan-yellow, fibrofatty tissue Inked: Blue Entirely submitted in cassette(s): 1-7  Final Diagnosis performed by Bryan Lemma, MD.   Electronically signed 06/21/2019 2:30:41PM The electronic signature indicates that the named Attending Pathologist has evaluated the specimen Technical component performed at [facility], [address], [place], [place] [address] Lab: [phone] Dir: Rush Farmer, MD, MMM  Professional component performed at [facility], [facility], [phone]  [place], [place], [place] [address] Lab: [phone] Dir: Dellia Nims. Reuel Derby, MD       Assessment & Plan:   Problem List Items Addressed This Visit      Cardiovascular and Mediastinum   Hypertension - Primary    Under good control on current regimen. Continue current regimen. Continue to monitor. Call with any concerns. Refills given. Labs to be drawn next week.        Relevant Medications   losartan (COZAAR) 25 MG tablet   Other Relevant Orders   Basic metabolic panel     Endocrine   Hypothyroidism (Chronic)    Doing well. Will recheck labs. Await results. Call with any concerns.       Relevant Orders   TSH     Other   Anxiety    Under good control on current regimen. Continue current regimen. Continue to monitor. Call with any concerns. Refills given. Labs to be drawn next week.       Relevant Medications   escitalopram (LEXAPRO) 20 MG tablet       Follow up plan: Return in about 6 months (around 01/05/2020) for physical.   . This visit was completed via mychart due to the restrictions of the COVID-19 pandemic. All issues as above were discussed and addressed. Physical exam was done as above through visual  confirmation on mychart. If it was felt that the patient should be evaluated in the office, they were directed there. The patient verbally consented to this visit. . Location of the patient: home . Location of the provider: work . Those involved with this call:  . Provider: Park Liter, DO . CMA: Tiffany Reel, CMA . Front Desk/Registration: Don Perking  . Time spent on call: 25 minutes with patient face to face via video conference. More than 50% of this time was spent in counseling and coordination of care. 40 minutes total spent in review of patient's record and preparation of their chart.

## 2019-07-08 NOTE — Assessment & Plan Note (Signed)
Doing well. Will recheck labs. Await results. Call with any concerns.

## 2019-07-08 NOTE — Assessment & Plan Note (Signed)
Under good control on current regimen. Continue current regimen. Continue to monitor. Call with any concerns. Refills given. Labs to be drawn next week.   

## 2019-07-12 ENCOUNTER — Other Ambulatory Visit: Payer: Self-pay

## 2019-07-12 ENCOUNTER — Other Ambulatory Visit: Payer: 59

## 2019-07-12 DIAGNOSIS — I10 Essential (primary) hypertension: Secondary | ICD-10-CM

## 2019-07-12 DIAGNOSIS — E039 Hypothyroidism, unspecified: Secondary | ICD-10-CM

## 2019-07-13 LAB — TSH: TSH: 1.26 u[IU]/mL (ref 0.450–4.500)

## 2019-07-13 LAB — BASIC METABOLIC PANEL
BUN/Creatinine Ratio: 26 — ABNORMAL HIGH (ref 9–23)
BUN: 15 mg/dL (ref 6–24)
CO2: 27 mmol/L (ref 20–29)
Calcium: 9.6 mg/dL (ref 8.7–10.2)
Chloride: 102 mmol/L (ref 96–106)
Creatinine, Ser: 0.58 mg/dL (ref 0.57–1.00)
GFR calc Af Amer: 120 mL/min/{1.73_m2} (ref >59)
GFR calc non Af Amer: 104 mL/min/{1.73_m2} (ref >59)
Glucose: 94 mg/dL (ref 65–99)
Potassium: 4.4 mmol/L (ref 3.5–5.2)
Sodium: 141 mmol/L (ref 134–144)

## 2019-07-21 ENCOUNTER — Encounter: Payer: Self-pay | Admitting: Family Medicine

## 2019-09-06 ENCOUNTER — Other Ambulatory Visit: Payer: Self-pay | Admitting: Family Medicine

## 2019-09-20 ENCOUNTER — Encounter: Payer: Self-pay | Admitting: Family Medicine

## 2019-09-20 DIAGNOSIS — Z9189 Other specified personal risk factors, not elsewhere classified: Secondary | ICD-10-CM

## 2019-09-20 DIAGNOSIS — Z889 Allergy status to unspecified drugs, medicaments and biological substances status: Secondary | ICD-10-CM

## 2019-10-11 ENCOUNTER — Encounter: Payer: Self-pay | Admitting: Family Medicine

## 2019-10-12 NOTE — Telephone Encounter (Signed)
FYI, already updated chart

## 2020-01-13 ENCOUNTER — Other Ambulatory Visit: Payer: Self-pay

## 2020-01-13 ENCOUNTER — Encounter: Payer: Self-pay | Admitting: Family Medicine

## 2020-01-13 ENCOUNTER — Ambulatory Visit (INDEPENDENT_AMBULATORY_CARE_PROVIDER_SITE_OTHER): Payer: BC Managed Care – PPO | Admitting: Family Medicine

## 2020-01-13 VITALS — BP 132/78 | HR 84 | Temp 98.6°F | Ht 63.58 in | Wt 164.0 lb

## 2020-01-13 DIAGNOSIS — Z0000 Encounter for general adult medical examination without abnormal findings: Secondary | ICD-10-CM | POA: Diagnosis not present

## 2020-01-13 DIAGNOSIS — E039 Hypothyroidism, unspecified: Secondary | ICD-10-CM

## 2020-01-13 DIAGNOSIS — N951 Menopausal and female climacteric states: Secondary | ICD-10-CM

## 2020-01-13 DIAGNOSIS — Z1231 Encounter for screening mammogram for malignant neoplasm of breast: Secondary | ICD-10-CM

## 2020-01-13 DIAGNOSIS — Z1283 Encounter for screening for malignant neoplasm of skin: Secondary | ICD-10-CM | POA: Diagnosis not present

## 2020-01-13 DIAGNOSIS — F419 Anxiety disorder, unspecified: Secondary | ICD-10-CM

## 2020-01-13 DIAGNOSIS — I10 Essential (primary) hypertension: Secondary | ICD-10-CM | POA: Diagnosis not present

## 2020-01-13 LAB — URINALYSIS, ROUTINE W REFLEX MICROSCOPIC
Bilirubin, UA: NEGATIVE
Glucose, UA: NEGATIVE
Ketones, UA: NEGATIVE
Leukocytes,UA: NEGATIVE
Nitrite, UA: NEGATIVE
Protein,UA: NEGATIVE
RBC, UA: NEGATIVE
Specific Gravity, UA: 1.015 (ref 1.005–1.030)
Urobilinogen, Ur: 0.2 mg/dL (ref 0.2–1.0)
pH, UA: 7 (ref 5.0–7.5)

## 2020-01-13 LAB — MICROALBUMIN, URINE WAIVED
Creatinine, Urine Waived: 100 mg/dL (ref 10–300)
Microalb, Ur Waived: 10 mg/L (ref 0–19)
Microalb/Creat Ratio: <30 mg/g (ref <30)

## 2020-01-13 MED ORDER — ESCITALOPRAM OXALATE 20 MG PO TABS: 20.0000 mg | ORAL_TABLET | Freq: Every day | ORAL | 1 refills | Status: DC

## 2020-01-13 MED ORDER — LOSARTAN POTASSIUM 25 MG PO TABS: 12.5000 mg | ORAL_TABLET | Freq: Every day | ORAL | 1 refills | Status: DC

## 2020-01-13 MED ORDER — NAPROXEN 500 MG PO TABS: 500.0000 mg | ORAL_TABLET | Freq: Two times a day (BID) | ORAL | 0 refills | Status: DC

## 2020-01-13 NOTE — Progress Notes (Signed)
BP (!) 132/78 (BP Location: Left Arm, Cuff Size: Normal)   Pulse 84   Temp 98.6 F (37 C) (Oral)   Ht 5' 3.58" (1.615 m)   Wt 164 lb (74.4 kg)   LMP 09/30/2018 (Approximate)   SpO2 98%   BMI 28.52 kg/m    Subjective:    Patient ID: Nichole Gordon, female    DOB: Aug 05, 1965, 54 y.o.   MRN: [id number]  HPI: Nichole Gordon is a 54 y.o. female presenting on 01/13/2020 for comprehensive medical examination. Current medical complaints include:  HYPERTENSION Hypertension status: stable  Satisfied with current treatment? yes Duration of hypertension: chronic BP monitoring frequency:  not checking BP medication side effects:  no Medication compliance: excellent compliance Aspirin: no Recurrent headaches: no Visual changes: no Palpitations: no Dyspnea: no Chest pain: no Lower extremity edema: no Dizzy/lightheaded: no  HYPOTHYROIDISM Thyroid control status:stable Satisfied with current treatment? yes Medication side effects: no Medication compliance: excellent compliance Etiology of hypothyroidism:  Recent dose adjustment:no Fatigue: no Cold intolerance: no Heat intolerance: no Weight gain: no Weight loss: no Constipation: no Diarrhea/loose stools: no Palpitations: no Lower extremity edema: no Anxiety/depressed mood: no  ANXIETY/STRESS Duration: chronic Status:stable Anxious mood: yes  Excessive worrying: no Irritability: no  Sweating: no Nausea: no Palpitations:no Hyperventilation: no Panic attacks: no Agoraphobia: no  Obscessions/compulsions: no Depressed mood: no Depression screen [facility] 2/9 01/13/2020 07/08/2019 01/04/2019 10/02/2018 02/26/2018  Decreased Interest 0 0 0 0 0  Down, Depressed, Hopeless 0 0 0 0 0  PHQ - 2 Score 0 0 0 0 0  Altered sleeping 1 3 0 0 0  Tired, decreased energy 2 1 0 1 1  Change in appetite 0 0 0 0 0  Feeling bad or failure about yourself  0 0 0 0 0  Trouble concentrating 0 0 0 0 0  Moving slowly or fidgety/restless 0 0 0 0 0    Suicidal thoughts 0 0 0 0 0  PHQ-9 Score 3 4 0 1 1  Difficult doing work/chores Not difficult at all Not difficult at all Not difficult at all Not difficult at all Not difficult at all   Anhedonia: no Weight changes: no Insomnia: no   Hypersomnia: no Fatigue/loss of energy: no Feelings of worthlessness: no Feelings of guilt: no Impaired concentration/indecisiveness: no Suicidal ideations: no  Crying spells: no Recent Stressors/Life Changes: no   Relationship problems: no   Family stress: no     Financial stress: no    Job stress: no    Recent death/loss: no  Menopausal Symptoms: yes  Depression Screen done today and results listed below:  Depression screen [facility] 2/9 01/13/2020 07/08/2019 01/04/2019 10/02/2018 02/26/2018  Decreased Interest 0 0 0 0 0  Down, Depressed, Hopeless 0 0 0 0 0  PHQ - 2 Score 0 0 0 0 0  Altered sleeping 1 3 0 0 0  Tired, decreased energy 2 1 0 1 1  Change in appetite 0 0 0 0 0  Feeling bad or failure about yourself  0 0 0 0 0  Trouble concentrating 0 0 0 0 0  Moving slowly or fidgety/restless 0 0 0 0 0  Suicidal thoughts 0 0 0 0 0  PHQ-9 Score 3 4 0 1 1  Difficult doing work/chores Not difficult at all Not difficult at all Not difficult at all Not difficult at all Not difficult at all    Past Medical History:  Past Medical History:  Diagnosis Date  . Anxiety   .  Family history of adverse reaction to anesthesia    Mother - low BP  . Fibrocystic breast disease   . Hypertension   . Hypothyroidism   . Motion sickness    car back seat   . Palpitations   . Wears contact lenses     Surgical History:  Past Surgical History:  Procedure Laterality Date  . BREAST BIOPSY Right 06/17/2019   stereotactic biopsy, x-clip, pending path   . COLONOSCOPY WITH PROPOFOL N/A 01/25/2019   Procedure: COLONOSCOPY WITH PROPOFOL;  Surgeon: Lucilla Lame, MD;  Location: [place];  Service: Endoscopy;  Laterality: N/A;  . LITHOTRIPSY  2012  . POLYPECTOMY   01/25/2019   Procedure: POLYPECTOMY;  Surgeon: Lucilla Lame, MD;  Location: [place];  Service: Endoscopy;;    Medications:  Current Outpatient Medications on File Prior to Visit  Medication Sig  . Cholecalciferol (VITAMIN D) 2000 UNITS CAPS Take by mouth.    . fluticasone (FLONASE) 50 MCG/ACT nasal spray Place 1 spray into both nostrils 2 (two) times daily.   Marland Kitchen levothyroxine (SYNTHROID) 25 MCG tablet Take 1 tablet (25 mcg total) by mouth daily.  Marland Kitchen loratadine (CLARITIN) 10 MG tablet Take 10 mg by mouth daily.  . Multiple Vitamin (MULTIVITAMIN) capsule Take 1 capsule by mouth daily.    . Omega-3 Fatty Acids (FISH OIL PO) Take by mouth daily.   No current facility-administered medications on file prior to visit.    Allergies:  Allergies  Allergen Reactions  . Amoxicillin Rash  . Omnipaque [Iohexol] Shortness Of Breath  . Phenytoin Sodium Extended Swelling  . Iodinated Diagnostic Agents Itching  . Trileptal [Oxcarbazepine] Swelling    Social History:  Social History   Socioeconomic History  . Marital status: Married    Spouse name: Not on file  . Number of children: Not on file  . Years of education: Not on file  . Highest education level: Not on file  Occupational History  . Not on file  Tobacco Use  . Smoking status: Never Smoker  . Smokeless tobacco: Never Used  Vaping Use  . Vaping Use: Never used  Substance and Sexual Activity  . Alcohol use: No  . Drug use: No  . Sexual activity: Yes    Birth control/protection: None  Other Topics Concern  . Not on file  Social History Narrative  . Not on file   Social Determinants of Health   Financial Resource Strain:   . Difficulty of Paying Living Expenses:   Food Insecurity:   . Worried About Charity fundraiser in the Last Year:   . Arboriculturist in the Last Year:   Transportation Needs:   . Film/video editor (Medical):   Marland Kitchen Lack of Transportation (Non-Medical):   Physical Activity:   . Days of  Exercise per Week:   . Minutes of Exercise per Session:   Stress:   . Feeling of Stress :   Social Connections:   . Frequency of Communication with Friends and Family:   . Frequency of Social Gatherings with Friends and Family:   . Attends Religious Services:   . Active Member of Clubs or Organizations:   . Attends Archivist Meetings:   Marland Kitchen Marital Status:   Intimate Partner Violence:   . Fear of Current or Ex-Partner:   . Emotionally Abused:   Marland Kitchen Physically Abused:   . Sexually Abused:    Social History   Tobacco Use  Smoking Status Never Smoker  Smokeless Tobacco Never Used   Social History   Substance and Sexual Activity  Alcohol Use No    Family History:  Family History  Problem Relation Age of Onset  . Cancer Father   . Hyperlipidemia Mother   . Hypertension Mother   . Arthritis Mother   . Cancer Mother        ovarian cancer  . Hypertension Brother   . Breast cancer Paternal Aunt 63  . Breast cancer Paternal Aunt 29    Past medical history, surgical history, medications, allergies, family history and social history reviewed with patient today and changes made to appropriate areas of the chart.   Review of Systems  Constitutional: Negative.   HENT: Negative.   Eyes: Negative.   Respiratory: Negative.   Cardiovascular: Negative.   Gastrointestinal: Positive for constipation, diarrhea and heartburn (with food choices). Negative for abdominal pain, blood in stool, melena, nausea and vomiting.  Genitourinary: Negative.   Musculoskeletal: Negative.   Skin: Negative.   Neurological: Negative.   Endo/Heme/Allergies: Positive for environmental allergies. Negative for polydipsia. Does not bruise/bleed easily.  Psychiatric/Behavioral: Negative.     All other ROS negative except what is listed above and in the HPI.      Objective:    BP (!) 132/78 (BP Location: Left Arm, Cuff Size: Normal)   Pulse 84   Temp 98.6 F (37 C) (Oral)   Ht 5' 3.58"  (1.615 m)   Wt 164 lb (74.4 kg)   LMP 09/30/2018 (Approximate)   SpO2 98%   BMI 28.52 kg/m   Wt Readings from Last 3 Encounters:  01/13/20 164 lb (74.4 kg)  07/08/19 160 lb (72.6 kg)  01/25/19 161 lb (73 kg)    Physical Exam Vitals and nursing note reviewed.  Constitutional:      General: She is not in acute distress.    Appearance: Normal appearance. She is not ill-appearing, toxic-appearing or diaphoretic.  HENT:     Head: Normocephalic and atraumatic.     Right Ear: Tympanic membrane, ear canal and external ear normal. There is no impacted cerumen.     Left Ear: Tympanic membrane, ear canal and external ear normal. There is no impacted cerumen.     Nose: Nose normal. No congestion or rhinorrhea.     Mouth/Throat:     Mouth: Mucous membranes are moist.     Pharynx: Oropharynx is clear. No oropharyngeal exudate or posterior oropharyngeal erythema.  Eyes:     General: No scleral icterus.       Right eye: No discharge.        Left eye: No discharge.     Extraocular Movements: Extraocular movements intact.     Conjunctiva/sclera: Conjunctivae normal.     Pupils: Pupils are equal, round, and reactive to light.  Neck:     Vascular: No carotid bruit.  Cardiovascular:     Rate and Rhythm: Normal rate and regular rhythm.     Pulses: Normal pulses.     Heart sounds: No murmur heard.  No friction rub. No gallop.   Pulmonary:     Effort: Pulmonary effort is normal. No respiratory distress.     Breath sounds: Normal breath sounds. No stridor. No wheezing, rhonchi or rales.  Chest:     Chest wall: No tenderness.  Abdominal:     General: Abdomen is flat. Bowel sounds are normal. There is no distension.     Palpations: Abdomen is soft. There is no mass.  Tenderness: There is no abdominal tenderness. There is no right CVA tenderness, left CVA tenderness, guarding or rebound.     Hernia: No hernia is present.  Genitourinary:    Comments: Breast and pelvic exams deferred with  shared decision making Musculoskeletal:        General: No swelling, tenderness, deformity or signs of injury.     Cervical back: Normal range of motion and neck supple. No rigidity. No muscular tenderness.     Right lower leg: No edema.     Left lower leg: No edema.  Lymphadenopathy:     Cervical: No cervical adenopathy.  Skin:    General: Skin is warm and dry.     Capillary Refill: Capillary refill takes less than 2 seconds.     Coloration: Skin is not jaundiced or pale.     Findings: No bruising, erythema, lesion or rash.  Neurological:     General: No focal deficit present.     Mental Status: She is alert and oriented to person, place, and time. Mental status is at baseline.     Cranial Nerves: No cranial nerve deficit.     Sensory: No sensory deficit.     Motor: No weakness.     Coordination: Coordination normal.     Gait: Gait normal.     Deep Tendon Reflexes: Reflexes normal.  Psychiatric:        Mood and Affect: Mood normal.        Behavior: Behavior normal.        Thought Content: Thought content normal.        Judgment: Judgment normal.     Results for orders placed or performed in visit on 07/12/19  TSH  Result Value Ref Range   TSH 1.260 0.450 - 4.500 uIU/mL  Basic metabolic panel  Result Value Ref Range   Glucose 94 65 - 99 mg/dL   BUN 15 6 - 24 mg/dL   Creatinine, Ser 0.58 0.57 - 1.00 mg/dL   GFR calc non Af Amer 104 >59 mL/min/1.73   GFR calc Af Amer 120 >59 mL/min/1.73   BUN/Creatinine Ratio 26 (H) 9 - 23   Sodium 141 134 - 144 mmol/L   Potassium 4.4 3.5 - 5.2 mmol/L   Chloride 102 96 - 106 mmol/L   CO2 27 20 - 29 mmol/L   Calcium 9.6 8.7 - 10.2 mg/dL      Assessment & Plan:   Problem List Items Addressed This Visit      Cardiovascular and Mediastinum   Hypertension    Under good control on current regimen. Continue current regimen. Continue to monitor. Call with any concerns. Refills given. Labs drawn today.       Relevant Medications    losartan (COZAAR) 25 MG tablet     Endocrine   Hypothyroidism (Chronic)    Rechecking labs today. Await results. Treat as needed.         Other   Anxiety    Under good control on current regimen. Continue current regimen. Continue to monitor. Call with any concerns. Refills given. Labs drawn today.       Relevant Medications   escitalopram (LEXAPRO) 20 MG tablet    Other Visit Diagnoses    Routine general medical examination at a health care facility    -  Primary   Vaccines up to date. Screening labs checked today. Mammogram ordered. Pap and colonoscopy up to date. Continue dier and exercise. Call with any concerns.  Relevant Orders   CBC with Differential/Platelet   Comprehensive metabolic panel   Lipid Panel w/o Chol/HDL Ratio   Microalbumin, Urine Waived   TSH   Urinalysis, Routine w reflex microscopic   Estradiol   LH   FSH   Testosterone, free, total([facility])   Perimenopause       Checking labs today. Doing OK with symptoms. Continue to monitor.    Relevant Orders   Estradiol   LH   FSH   Testosterone, free, total([facility])   Encounter for screening mammogram for malignant neoplasm of breast       Mammogram ordered today. Await results.    Relevant Orders   MM 3D SCREEN BREAST BILATERAL   Screening for skin cancer       Referral to dermatology made today. Await results.    Relevant Orders   Ambulatory referral to Dermatology       Follow up plan: Return in about 6 months (around 07/15/2020).   LABORATORY TESTING:  - Pap smear: up to date  IMMUNIZATIONS:   - Tdap: Tetanus vaccination status reviewed: last tetanus booster within 10 years. - Influenza: Postponed to flu season - Pneumovax: Not applicable - COVID: up to date  SCREENING: -Mammogram: Ordered today  - Colonoscopy: Up to date   PATIENT COUNSELING:   Advised to take 1 mg of folate supplement per day if capable of pregnancy.   Sexuality: Discussed sexually transmitted  diseases, partner selection, use of condoms, avoidance of unintended pregnancy  and contraceptive alternatives.   Advised to avoid cigarette smoking.  I discussed with the patient that most people either abstain from alcohol or drink within safe limits (<=14/week and <=4 drinks/occasion for males, <=7/weeks and <= 3 drinks/occasion for females) and that the risk for alcohol disorders and other health effects rises proportionally with the number of drinks per week and how often a drinker exceeds daily limits.  Discussed cessation/primary prevention of drug use and availability of treatment for abuse.   Diet: Encouraged to adjust caloric intake to maintain  or achieve ideal body weight, to reduce intake of dietary saturated fat and total fat, to limit sodium intake by avoiding high sodium foods and not adding table salt, and to maintain adequate dietary potassium and calcium preferably from fresh fruits, vegetables, and low-fat dairy products.    stressed the importance of regular exercise  Injury prevention: Discussed safety belts, safety helmets, smoke detector, smoking near bedding or upholstery.   Dental health: Discussed importance of regular tooth brushing, flossing, and dental visits.    NEXT PREVENTATIVE PHYSICAL DUE IN 1 YEAR. Return in about 6 months (around 07/15/2020).

## 2020-01-13 NOTE — Assessment & Plan Note (Signed)
Under good control on current regimen. Continue current regimen. Continue to monitor. Call with any concerns. Refills given. Labs drawn today.   

## 2020-01-13 NOTE — Patient Instructions (Addendum)
Call to schedule your mammogram  [facility]  Address: [address], [place], [place] [address]  Phone: [phone]   Health Maintenance, Female Adopting a healthy lifestyle and getting preventive care are important in promoting health and wellness. Ask your health care provider about:  The right schedule for you to have regular tests and exams.  Things you can do on your own to prevent diseases and keep yourself healthy. What should I know about diet, weight, and exercise? Eat a healthy diet   Eat a diet that includes plenty of vegetables, fruits, low-fat dairy products, and lean protein.  Do not eat a lot of foods that are high in solid fats, added sugars, or sodium. Maintain a healthy weight Body mass index (BMI) is used to identify weight problems. It estimates body fat based on height and weight. Your health care provider can help determine your BMI and help you achieve or maintain a healthy weight. Get regular exercise Get regular exercise. This is one of the most important things you can do for your health. Most adults should:  Exercise for at least 150 minutes each week. The exercise should increase your heart rate and make you sweat (moderate-intensity exercise).  Do strengthening exercises at least twice a week. This is in addition to the moderate-intensity exercise.  Spend less time sitting. Even light physical activity can be beneficial. Watch cholesterol and blood lipids Have your blood tested for lipids and cholesterol at 56 years of age, then have this test every 5 years. Have your cholesterol levels checked more often if:  Your lipid or cholesterol levels are high.  You are older than 56 years of age.  You are at high risk for heart disease. What should I know about cancer screening? Depending on your health history and family history, you may need to have cancer screening at various ages. This may include screening  for:  Breast cancer.  Cervical cancer.  Colorectal cancer.  Skin cancer.  Lung cancer. What should I know about heart disease, diabetes, and high blood pressure? Blood pressure and heart disease  High blood pressure causes heart disease and increases the risk of stroke. This is more likely to develop in people who have high blood pressure readings, are of African descent, or are overweight.  Have your blood pressure checked: ? Every 3-5 years if you are 18-39 years of age. ? Every year if you are 40 years old or older. Diabetes Have regular diabetes screenings. This checks your fasting blood sugar level. Have the screening done:  Once every three years after age 40 if you are at a normal weight and have a low risk for diabetes.  More often and at a younger age if you are overweight or have a high risk for diabetes. What should I know about preventing infection? Hepatitis B If you have a higher risk for hepatitis B, you should be screened for this virus. Talk with your health care provider to find out if you are at risk for hepatitis B infection. Hepatitis C Testing is recommended for:  Everyone born from 1945 through 1965.  Anyone with known risk factors for hepatitis C. Sexually transmitted infections (STIs)  Get screened for STIs, including gonorrhea and chlamydia, if: ? You are sexually active and are younger than 56 years of age. ? You are older than 56 years of age and your health care provider tells you that you are at risk for this type of   infection. ? Your sexual activity has changed since you were last screened, and you are at increased risk for chlamydia or gonorrhea. Ask your health care provider if you are at risk.  Ask your health care provider about whether you are at high risk for HIV. Your health care provider may recommend a prescription medicine to help prevent HIV infection. If you choose to take medicine to prevent HIV, you should first get tested for HIV.  You should then be tested every 3 months for as long as you are taking the medicine. Pregnancy  If you are about to stop having your period (premenopausal) and you may become pregnant, seek counseling before you get pregnant.  Take 400 to 800 micrograms (mcg) of folic acid every day if you become pregnant.  Ask for birth control (contraception) if you want to prevent pregnancy. Osteoporosis and menopause Osteoporosis is a disease in which the bones lose minerals and strength with aging. This can result in bone fractures. If you are 65 years old or older, or if you are at risk for osteoporosis and fractures, ask your health care provider if you should:  Be screened for bone loss.  Take a calcium or vitamin D supplement to lower your risk of fractures.  Be given hormone replacement therapy (HRT) to treat symptoms of menopause. Follow these instructions at home: Lifestyle  Do not use any products that contain nicotine or tobacco, such as cigarettes, e-cigarettes, and chewing tobacco. If you need help quitting, ask your health care provider.  Do not use street drugs.  Do not share needles.  Ask your health care provider for help if you need support or information about quitting drugs. Alcohol use  Do not drink alcohol if: ? Your health care provider tells you not to drink. ? You are pregnant, may be pregnant, or are planning to become pregnant.  If you drink alcohol: ? Limit how much you use to 0-1 drink a day. ? Limit intake if you are breastfeeding.  Be aware of how much alcohol is in your drink. In the [place] one drink equals one 12 oz bottle of beer (355 mL), one 5 oz glass of wine (148 mL), or one 1 oz glass of hard liquor (44 mL). General instructions  Schedule regular health, dental, and eye exams.  Stay current with your vaccines.  Tell your health care provider if: ? You often feel depressed. ? You have ever been abused or do not feel safe at  home. Summary  Adopting a healthy lifestyle and getting preventive care are important in promoting health and wellness.  Follow your health care provider's instructions about healthy diet, exercising, and getting tested or screened for diseases.  Follow your health care provider's instructions on monitoring your cholesterol and blood pressure. This information is not intended to replace advice given to you by your health care provider. Make sure you discuss any questions you have with your health care provider. Document Revised: 05/27/2018 Document Reviewed: 05/27/2018 Elsevier Patient Education  2020 Elsevier Inc.  

## 2020-01-13 NOTE — Assessment & Plan Note (Signed)
Rechecking labs today. Await results. Treat as needed.  °

## 2020-01-18 LAB — COMPREHENSIVE METABOLIC PANEL
ALT: 18 IU/L (ref 0–32)
AST: 22 IU/L (ref 0–40)
Albumin/Globulin Ratio: 2.3 — ABNORMAL HIGH (ref 1.2–2.2)
Albumin: 4.9 g/dL (ref 3.8–4.9)
Alkaline Phosphatase: 99 IU/L (ref 48–121)
BUN/Creatinine Ratio: 26 — ABNORMAL HIGH (ref 9–23)
BUN: 14 mg/dL (ref 6–24)
Bilirubin Total: 0.4 mg/dL (ref 0.0–1.2)
CO2: 25 mmol/L (ref 20–29)
Calcium: 9.4 mg/dL (ref 8.7–10.2)
Chloride: 101 mmol/L (ref 96–106)
Creatinine, Ser: 0.54 mg/dL — ABNORMAL LOW (ref 0.57–1.00)
GFR calc Af Amer: 122 mL/min/{1.73_m2} (ref >59)
GFR calc non Af Amer: 106 mL/min/{1.73_m2} (ref >59)
Globulin, Total: 2.1 g/dL (ref 1.5–4.5)
Glucose: 88 mg/dL (ref 65–99)
Potassium: 4.3 mmol/L (ref 3.5–5.2)
Sodium: 140 mmol/L (ref 134–144)
Total Protein: 7 g/dL (ref 6.0–8.5)

## 2020-01-18 LAB — CBC WITH DIFFERENTIAL/PLATELET
Basophils Absolute: 0 10*3/uL (ref 0.0–0.2)
Basos: 1 %
EOS (ABSOLUTE): 0.1 10*3/uL (ref 0.0–0.4)
Eos: 2 %
Hematocrit: 42.5 % (ref 34.0–46.6)
Hemoglobin: 14.2 g/dL (ref 11.1–15.9)
Immature Grans (Abs): 0 10*3/uL (ref 0.0–0.1)
Immature Granulocytes: 1 %
Lymphocytes Absolute: 2.3 10*3/uL (ref 0.7–3.1)
Lymphs: 35 %
MCH: 30.3 pg (ref 26.6–33.0)
MCHC: 33.4 g/dL (ref 31.5–35.7)
MCV: 91 fL (ref 79–97)
Monocytes Absolute: 0.4 10*3/uL (ref 0.1–0.9)
Monocytes: 6 %
Neutrophils Absolute: 3.6 10*3/uL (ref 1.4–7.0)
Neutrophils: 55 %
Platelets: 224 10*3/uL (ref 150–450)
RBC: 4.69 x10E6/uL (ref 3.77–5.28)
RDW: 12.8 % (ref 11.7–15.4)
WBC: 6.4 10*3/uL (ref 3.4–10.8)

## 2020-01-18 LAB — ESTRADIOL: Estradiol: 6.9 pg/mL

## 2020-01-18 LAB — LIPID PANEL W/O CHOL/HDL RATIO
Cholesterol, Total: 259 mg/dL — ABNORMAL HIGH (ref 100–199)
HDL: 74 mg/dL (ref >39)
LDL Chol Calc (NIH): 164 mg/dL — ABNORMAL HIGH (ref 0–99)
Triglycerides: 119 mg/dL (ref 0–149)
VLDL Cholesterol Cal: 21 mg/dL (ref 5–40)

## 2020-01-18 LAB — TESTOSTERONE, FREE, TOTAL, SHBG
Sex Hormone Binding: 52.8 nmol/L (ref 17.3–125.0)
Testosterone, Free: 6.6 pg/mL — ABNORMAL HIGH (ref 0.0–4.2)
Testosterone: 17 ng/dL (ref 4–50)

## 2020-01-18 LAB — TSH: TSH: 1.55 u[IU]/mL (ref 0.450–4.500)

## 2020-01-18 LAB — FOLLICLE STIMULATING HORMONE: FSH: 81.7 m[IU]/mL

## 2020-01-18 LAB — LUTEINIZING HORMONE: LH: 45.5 m[IU]/mL

## 2020-02-10 ENCOUNTER — Other Ambulatory Visit: Payer: Self-pay | Admitting: Family Medicine

## 2020-02-10 NOTE — Telephone Encounter (Signed)
Requested Prescriptions  Pending Prescriptions Disp Refills  . levothyroxine (SYNTHROID) 25 MCG tablet [Pharmacy Med Name: LEVOTHYROXINE 25 MCG TABLET] 90 tablet 3    Sig: TAKE 1 TABLET BY MOUTH EVERY DAY     Endocrinology:  Hypothyroid Agents Failed - 02/10/2020  1:09 AM      Failed - TSH needs to be rechecked within 3 months after an abnormal result. Refill until TSH is due.      Passed - TSH in normal range and within 360 days    TSH  Date Value Ref Range Status  01/13/2020 1.550 0.450 - 4.500 uIU/mL Final         Passed - Valid encounter within last 12 months    Recent Outpatient Visits          4 weeks ago Routine general medical examination at a health care facility   [facility], [place], DO   7 months ago Essential hypertension   [place], [place], DO   1 year ago Routine general medical examination at a health care facility   [facility], [place], DO   1 year ago Essential hypertension   [place], [place], DO   1 year ago Essential hypertension   [place], Barb Merino, DO      Future Appointments            In 5 months Wynetta Emery, Barb Merino, DO [employer], [place]

## 2020-02-16 DIAGNOSIS — L578 Other skin changes due to chronic exposure to nonionizing radiation: Secondary | ICD-10-CM | POA: Diagnosis not present

## 2020-02-16 DIAGNOSIS — L918 Other hypertrophic disorders of the skin: Secondary | ICD-10-CM | POA: Diagnosis not present

## 2020-02-16 DIAGNOSIS — Z808 Family history of malignant neoplasm of other organs or systems: Secondary | ICD-10-CM | POA: Diagnosis not present

## 2020-02-29 ENCOUNTER — Ambulatory Visit
Admission: RE | Admit: 2020-02-29 | Discharge: 2020-02-29 | Disposition: A | Discharge status: Home / Self Care | Payer: BC Managed Care – PPO | Source: Ambulatory Visit | Attending: Family Medicine | Admitting: Family Medicine

## 2020-02-29 ENCOUNTER — Other Ambulatory Visit: Payer: Self-pay

## 2020-02-29 DIAGNOSIS — Z1231 Encounter for screening mammogram for malignant neoplasm of breast: Secondary | ICD-10-CM

## 2020-04-13 ENCOUNTER — Other Ambulatory Visit: Payer: Self-pay | Admitting: Family Medicine

## 2020-04-13 NOTE — Telephone Encounter (Signed)
Requested medications are due for refill today yes  Requested medications are on the active medication list yes  Last refill 7/29  Last visit July 2021  Notes to clinic Unsure of dx this med is for, please assess.

## 2020-04-13 NOTE — Telephone Encounter (Signed)
Patient last seen 01/13/20 and has appointment 07/18/20.

## 2020-07-18 ENCOUNTER — Encounter: Payer: Self-pay | Admitting: Family Medicine

## 2020-07-18 ENCOUNTER — Ambulatory Visit: Payer: BC Managed Care – PPO | Admitting: Family Medicine

## 2020-07-18 ENCOUNTER — Other Ambulatory Visit: Payer: Self-pay

## 2020-07-18 VITALS — BP 134/72 | HR 82 | Temp 98.6°F | Wt 168.0 lb

## 2020-07-18 DIAGNOSIS — E039 Hypothyroidism, unspecified: Secondary | ICD-10-CM | POA: Diagnosis not present

## 2020-07-18 DIAGNOSIS — I10 Essential (primary) hypertension: Secondary | ICD-10-CM

## 2020-07-18 DIAGNOSIS — F419 Anxiety disorder, unspecified: Secondary | ICD-10-CM | POA: Diagnosis not present

## 2020-07-18 MED ORDER — ESCITALOPRAM OXALATE 20 MG PO TABS: 20.0000 mg | ORAL_TABLET | Freq: Every day | ORAL | 1 refills | Status: DC

## 2020-07-18 MED ORDER — LOSARTAN POTASSIUM 25 MG PO TABS: 12.5000 mg | ORAL_TABLET | Freq: Every day | ORAL | 1 refills | Status: DC

## 2020-07-18 NOTE — Assessment & Plan Note (Signed)
Under good control on current regimen. Continue current regimen. Continue to monitor. Call with any concerns. Refills given. Labs drawn today.   

## 2020-07-18 NOTE — Assessment & Plan Note (Signed)
Rechecking labs today. Await results. Treat as needed.  °

## 2020-07-18 NOTE — Progress Notes (Signed)
BP 134/72   Pulse 82   Temp 98.6 F (37 C)   Wt 168 lb (76.2 kg)   SpO2 96%   BMI 29.22 kg/m    Subjective:    Patient ID: Nichole Gordon, female    DOB: 11/18/1965, 55 y.o.   MRN: [id number]  HPI: Nichole Gordon is a 55 y.o. female  Chief Complaint  Patient presents with  . Anxiety  . Hypothyroidism   ANXIETY/STRESS Duration: chronic Status:controlled Anxious mood: no  Excessive worrying: no Irritability: no  Sweating: no Nausea: no Palpitations:no Hyperventilation: no Panic attacks: no Agoraphobia: no  Obscessions/compulsions: no Depressed mood: no Depression screen [facility] 2/9 07/18/2020 01/13/2020 07/08/2019 01/04/2019 10/02/2018  Decreased Interest 0 0 0 0 0  Down, Depressed, Hopeless 0 0 0 0 0  PHQ - 2 Score 0 0 0 0 0  Altered sleeping 0 1 3 0 0  Tired, decreased energy 1 2 1  0 1  Change in appetite 0 0 0 0 0  Feeling bad or failure about yourself  0 0 0 0 0  Trouble concentrating 0 0 0 0 0  Moving slowly or fidgety/restless 0 0 0 0 0  Suicidal thoughts 0 0 0 0 0  PHQ-9 Score 1 3 4  0 1  Difficult doing work/chores Not difficult at all Not difficult at all Not difficult at all Not difficult at all Not difficult at all  Some recent data might be hidden   Anhedonia: no Weight changes: no Insomnia: no   Hypersomnia: no Fatigue/loss of energy: no Feelings of worthlessness: no Feelings of guilt: no Impaired concentration/indecisiveness: no Suicidal ideations: no  Crying spells: no Recent Stressors/Life Changes: no   Relationship problems: no   Family stress: no     Financial stress: no    Job stress: no    Recent death/loss: no  HYPERTENSION Hypertension status: controlled  Satisfied with current treatment? yes Duration of hypertension: chronic BP monitoring frequency:  not checking BP medication side effects:  no Medication compliance: excellent compliance Previous BP meds:losartan Aspirin: no Recurrent headaches: no Visual changes:  no Palpitations: no Dyspnea: no Chest pain: no Lower extremity edema: no Dizzy/lightheaded: no  HYPOTHYROIDISM Thyroid control status:controlled Satisfied with current treatment? yes Medication side effects: no Medication compliance: excellent compliance Recent dose adjustment:no Fatigue: no Cold intolerance: no Heat intolerance: no Weight gain: no Weight loss: no Constipation: no Diarrhea/loose stools: no Palpitations: no Lower extremity edema: no Anxiety/depressed mood: no  Relevant past medical, surgical, family and social history reviewed and updated as indicated. Interim medical history since our last visit reviewed. Allergies and medications reviewed and updated.  Review of Systems  Constitutional: Negative.   Respiratory: Negative.   Cardiovascular: Negative.   Gastrointestinal: Negative.   Musculoskeletal: Negative.   Psychiatric/Behavioral: Negative.     Per HPI unless specifically indicated above     Objective:    BP 134/72   Pulse 82   Temp 98.6 F (37 C)   Wt 168 lb (76.2 kg)   SpO2 96%   BMI 29.22 kg/m   Wt Readings from Last 3 Encounters:  07/18/20 168 lb (76.2 kg)  01/13/20 164 lb (74.4 kg)  07/08/19 160 lb (72.6 kg)    Physical Exam Vitals and nursing note reviewed.  Constitutional:      General: She is not in acute distress.    Appearance: Normal appearance. She is not ill-appearing, toxic-appearing or diaphoretic.  HENT:     Head: Normocephalic and atraumatic.  Right Ear: External ear normal.     Left Ear: External ear normal.     Nose: Nose normal.     Mouth/Throat:     Mouth: Mucous membranes are moist.     Pharynx: Oropharynx is clear.  Eyes:     General: No scleral icterus.       Right eye: No discharge.        Left eye: No discharge.     Extraocular Movements: Extraocular movements intact.     Conjunctiva/sclera: Conjunctivae normal.     Pupils: Pupils are equal, round, and reactive to light.  Cardiovascular:      Rate and Rhythm: Normal rate and regular rhythm.     Pulses: Normal pulses.     Heart sounds: Normal heart sounds. No murmur heard. No friction rub. No gallop.   Pulmonary:     Effort: Pulmonary effort is normal. No respiratory distress.     Breath sounds: Normal breath sounds. No stridor. No wheezing, rhonchi or rales.  Chest:     Chest wall: No tenderness.  Musculoskeletal:        General: Normal range of motion.     Cervical back: Normal range of motion and neck supple.  Skin:    General: Skin is warm and dry.     Capillary Refill: Capillary refill takes less than 2 seconds.     Coloration: Skin is not jaundiced or pale.     Findings: No bruising, erythema, lesion or rash.  Neurological:     General: No focal deficit present.     Mental Status: She is alert and oriented to person, place, and time. Mental status is at baseline.  Psychiatric:        Mood and Affect: Mood normal.        Behavior: Behavior normal.        Thought Content: Thought content normal.        Judgment: Judgment normal.     Results for orders placed or performed in visit on 01/13/20  CBC with Differential/Platelet  Result Value Ref Range   WBC 6.4 3.4 - 10.8 x10E3/uL   RBC 4.69 3.77 - 5.28 x10E6/uL   Hemoglobin 14.2 11.1 - 15.9 g/dL   Hematocrit 42.5 34.0 - 46.6 %   MCV 91 79 - 97 fL   MCH 30.3 26.6 - 33.0 pg   MCHC 33.4 31.5 - 35.7 g/dL   RDW 12.8 11.7 - 15.4 %   Platelets 224 150 - 450 x10E3/uL   Neutrophils 55 Not Estab. %   Lymphs 35 Not Estab. %   Monocytes 6 Not Estab. %   Eos 2 Not Estab. %   Basos 1 Not Estab. %   Neutrophils Absolute 3.6 1.4 - 7.0 x10E3/uL   Lymphocytes Absolute 2.3 0.7 - 3.1 x10E3/uL   Monocytes Absolute 0.4 0.1 - 0.9 x10E3/uL   EOS (ABSOLUTE) 0.1 0.0 - 0.4 x10E3/uL   Basophils Absolute 0.0 0.0 - 0.2 x10E3/uL   Immature Granulocytes 1 Not Estab. %   Immature Grans (Abs) 0.0 0.0 - 0.1 x10E3/uL  Comprehensive metabolic panel  Result Value Ref Range   Glucose 88 65  - 99 mg/dL   BUN 14 6 - 24 mg/dL   Creatinine, Ser 0.54 (L) 0.57 - 1.00 mg/dL   GFR calc non Af Amer 106 >59 mL/min/1.73   GFR calc Af Amer 122 >59 mL/min/1.73   BUN/Creatinine Ratio 26 (H) 9 - 23   Sodium 140 134 - 144 mmol/L  Potassium 4.3 3.5 - 5.2 mmol/L   Chloride 101 96 - 106 mmol/L   CO2 25 20 - 29 mmol/L   Calcium 9.4 8.7 - 10.2 mg/dL   Total Protein 7.0 6.0 - 8.5 g/dL   Albumin 4.9 3.8 - 4.9 g/dL   Globulin, Total 2.1 1.5 - 4.5 g/dL   Albumin/Globulin Ratio 2.3 (H) 1.2 - 2.2   Bilirubin Total 0.4 0.0 - 1.2 mg/dL   Alkaline Phosphatase 99 48 - 121 IU/L   AST 22 0 - 40 IU/L   ALT 18 0 - 32 IU/L  Lipid Panel w/o Chol/HDL Ratio  Result Value Ref Range   Cholesterol, Total 259 (H) 100 - 199 mg/dL   Triglycerides 119 0 - 149 mg/dL   HDL 74 >39 mg/dL   VLDL Cholesterol Cal 21 5 - 40 mg/dL   LDL Chol Calc (NIH) 164 (H) 0 - 99 mg/dL  Microalbumin, Urine Waived  Result Value Ref Range   Microalb, Ur Waived 10 0 - 19 mg/L   Creatinine, Urine Waived 100 10 - 300 mg/dL   Microalb/Creat Ratio <30 <30 mg/g  TSH  Result Value Ref Range   TSH 1.550 0.450 - 4.500 uIU/mL  Urinalysis, Routine w reflex microscopic  Result Value Ref Range   Specific Gravity, UA 1.015 1.005 - 1.030   pH, UA 7.0 5.0 - 7.5   Color, UA Yellow Yellow   Appearance Ur Clear Clear   Leukocytes,UA Negative Negative   Protein,UA Negative Negative/Trace   Glucose, UA Negative Negative   Ketones, UA Negative Negative   RBC, UA Negative Negative   Bilirubin, UA Negative Negative   Urobilinogen, Ur 0.2 0.2 - 1.0 mg/dL   Nitrite, UA Negative Negative  Estradiol  Result Value Ref Range   Estradiol 6.9 pg/mL  LH  Result Value Ref Range   LH 45.5 mIU/mL  FSH  Result Value Ref Range   FSH 81.7 mIU/mL  Testosterone, free, total([facility])  Result Value Ref Range   Testosterone 17 4 - 50 ng/dL   Testosterone, Free 6.6 (H) 0.0 - 4.2 pg/mL   Sex Hormone Binding 52.8 17.3 - 125.0 nmol/L       Assessment & Plan:   Problem List Items Addressed This Visit      Cardiovascular and Mediastinum   Hypertension - Primary    Under good control on current regimen. Continue current regimen. Continue to monitor. Call with any concerns. Refills given. Labs drawn today.        Relevant Medications   losartan (COZAAR) 25 MG tablet   Other Relevant Orders   Basic metabolic panel     Endocrine   Hypothyroidism (Chronic)    Rechecking labs today. Await results. Treat as needed.       Relevant Orders   TSH     Other   Anxiety    Under good control on current regimen. Continue current regimen. Continue to monitor. Call with any concerns. Refills given. Labs drawn today.       Relevant Medications   escitalopram (LEXAPRO) 20 MG tablet       Follow up plan: Return in about 6 months (around 01/15/2021).

## 2020-07-19 LAB — BASIC METABOLIC PANEL
BUN/Creatinine Ratio: 24 — ABNORMAL HIGH (ref 9–23)
BUN: 14 mg/dL (ref 6–24)
CO2: 25 mmol/L (ref 20–29)
Calcium: 9.3 mg/dL (ref 8.7–10.2)
Chloride: 102 mmol/L (ref 96–106)
Creatinine, Ser: 0.59 mg/dL (ref 0.57–1.00)
GFR calc Af Amer: 118 mL/min/{1.73_m2} (ref >59)
GFR calc non Af Amer: 103 mL/min/{1.73_m2} (ref >59)
Glucose: 92 mg/dL (ref 65–99)
Potassium: 4.4 mmol/L (ref 3.5–5.2)
Sodium: 142 mmol/L (ref 134–144)

## 2020-07-19 LAB — TSH: TSH: 1.25 u[IU]/mL (ref 0.450–4.500)

## 2021-01-16 ENCOUNTER — Ambulatory Visit (INDEPENDENT_AMBULATORY_CARE_PROVIDER_SITE_OTHER): Payer: BC Managed Care – PPO | Admitting: Family Medicine

## 2021-01-16 ENCOUNTER — Other Ambulatory Visit: Payer: Self-pay | Admitting: Family Medicine

## 2021-01-16 ENCOUNTER — Encounter: Payer: Self-pay | Admitting: Family Medicine

## 2021-01-16 ENCOUNTER — Other Ambulatory Visit: Payer: Self-pay

## 2021-01-16 VITALS — BP 134/92 | HR 98 | Temp 98.3°F | Ht 65.0 in | Wt 168.4 lb

## 2021-01-16 DIAGNOSIS — Z0000 Encounter for general adult medical examination without abnormal findings: Secondary | ICD-10-CM

## 2021-01-16 DIAGNOSIS — F419 Anxiety disorder, unspecified: Secondary | ICD-10-CM

## 2021-01-16 DIAGNOSIS — I10 Essential (primary) hypertension: Secondary | ICD-10-CM

## 2021-01-16 DIAGNOSIS — E039 Hypothyroidism, unspecified: Secondary | ICD-10-CM

## 2021-01-16 DIAGNOSIS — Z1231 Encounter for screening mammogram for malignant neoplasm of breast: Secondary | ICD-10-CM | POA: Diagnosis not present

## 2021-01-16 LAB — URINALYSIS, ROUTINE W REFLEX MICROSCOPIC
Bilirubin, UA: NEGATIVE
Glucose, UA: NEGATIVE
Ketones, UA: NEGATIVE
Nitrite, UA: NEGATIVE
Protein,UA: NEGATIVE
RBC, UA: NEGATIVE
Specific Gravity, UA: 1.015 (ref 1.005–1.030)
Urobilinogen, Ur: 1 mg/dL (ref 0.2–1.0)
pH, UA: 8.5 — ABNORMAL HIGH (ref 5.0–7.5)

## 2021-01-16 LAB — MICROSCOPIC EXAMINATION: RBC, Urine: NONE SEEN /hpf (ref 0–2)

## 2021-01-16 LAB — MICROALBUMIN, URINE WAIVED
Creatinine, Urine Waived: 100 mg/dL (ref 10–300)
Microalb, Ur Waived: 10 mg/L (ref 0–19)
Microalb/Creat Ratio: <30 mg/g (ref <30)

## 2021-01-16 MED ORDER — LOSARTAN POTASSIUM 25 MG PO TABS: 12.5000 mg | ORAL_TABLET | Freq: Every day | ORAL | 1 refills | Status: DC

## 2021-01-16 MED ORDER — ESCITALOPRAM OXALATE 20 MG PO TABS: 20.0000 mg | ORAL_TABLET | Freq: Every day | ORAL | 1 refills | Status: DC

## 2021-01-16 MED ORDER — NAPROXEN 500 MG PO TABS: 500.0000 mg | ORAL_TABLET | Freq: Two times a day (BID) | ORAL | 0 refills | Status: DC

## 2021-01-16 MED ORDER — OMEPRAZOLE 20 MG PO CPDR: 20.0000 mg | DELAYED_RELEASE_CAPSULE | Freq: Every day | ORAL | 3 refills | Status: DC

## 2021-01-16 NOTE — Telephone Encounter (Signed)
Prior authorization needed. Routing to [employer].

## 2021-01-16 NOTE — Assessment & Plan Note (Signed)
Under good control on current regimen. Continue current regimen. Continue to monitor. Call with any concerns. Refills given. Labs drawn today.   

## 2021-01-16 NOTE — Telephone Encounter (Signed)
Requested medication (s) are due for refill today:   Yes  Requested medication (s) are on the active medication list:   Yes  Future visit scheduled:   Yes   Last ordered: Today 01/16/2021 #30, 3 refills  Returned because pharmacy requesting a PA.    OTC med not covered by insur.   Requested Prescriptions  Pending Prescriptions Disp Refills   omeprazole (PRILOSEC) 20 MG capsule [Pharmacy Med Name: OMEPRAZOLE DR 20 MG CAPSULE] 30 capsule 3    Sig: TAKE 1 CAPSULE BY MOUTH EVERY DAY      Gastroenterology: Proton Pump Inhibitors Passed - 01/16/2021 10:55 AM      Passed - Valid encounter within last 12 months    Recent Outpatient Visits           Today Routine general medical examination at a health care facility   [facility], [place], DO   6 months ago Primary hypertension   [place], [place], DO   1 year ago Routine general medical examination at a health care facility   [facility], [place], DO   1 year ago Essential hypertension   [place], [place], DO   2 years ago Routine general medical examination at a health care facility   [place], Barb Merino, DO       Future Appointments             In 6 months Wynetta Emery, Barb Merino, DO [facility], [place]

## 2021-01-16 NOTE — Patient Instructions (Signed)
Call to schedule your Mammogram: [facility]  Address: [address] [place], [place], [place] [address]  Phone: [phone]

## 2021-01-16 NOTE — Assessment & Plan Note (Signed)
Rechecking labs today. Await results. Treat as needed.  °

## 2021-01-16 NOTE — Progress Notes (Signed)
BP (!) 134/92   Pulse 98   Temp 98.3 F (36.8 C) (Oral)   Ht '5\' 5"'$  (1.651 m)   Wt 168 lb 6.4 oz (76.4 kg)   SpO2 96%   BMI 28.02 kg/m    Subjective:    Patient ID: Nichole Gordon, female    DOB: Oct 08, 1965, 55 y.o.   MRN: [id number]  HPI: Nichole Gordon is a 55 y.o. female presenting on 01/16/2021 for comprehensive medical examination. Current medical complaints include:  HYPERTENSION Hypertension status: controlled  Satisfied with current treatment? yes Duration of hypertension: chronic BP monitoring frequency:  not checking BP medication side effects:  no Medication compliance: excellent compliance Previous BP meds:losartan Aspirin: no Recurrent headaches: no Visual changes: no Palpitations: no Dyspnea: no Chest pain: no Lower extremity edema: no Dizzy/lightheaded: no  ANXIETY/STRESS Duration:controlled Anxious mood: no  Excessive worrying: no Irritability: no  Sweating: no Nausea: no Palpitations:no Hyperventilation: no Panic attacks: no Agoraphobia: no  Obscessions/compulsions: no Depressed mood: no Depression screen [facility] 2/9 01/16/2021 07/18/2020 01/13/2020 07/08/2019 01/04/2019  Decreased Interest 0 0 0 0 0  Down, Depressed, Hopeless 0 0 0 0 0  PHQ - 2 Score 0 0 0 0 0  Altered sleeping 0 0 1 3 0  Tired, decreased energy '1 1 2 1 '$ 0  Change in appetite 0 0 0 0 0  Feeling bad or failure about yourself  0 0 0 0 0  Trouble concentrating 0 0 0 0 0  Moving slowly or fidgety/restless 0 0 0 0 0  Suicidal thoughts 0 0 0 0 0  PHQ-9 Score '1 1 3 4 '$ 0  Difficult doing work/chores Not difficult at all Not difficult at all Not difficult at all Not difficult at all Not difficult at all  Some recent data might be hidden   Anhedonia: no Weight changes: no Insomnia: no   Hypersomnia: no Fatigue/loss of energy: no Feelings of worthlessness: no Feelings of guilt: no Impaired concentration/indecisiveness: no Suicidal ideations: no  Crying spells: no Recent Stressors/Life  Changes: no   Relationship problems: no   Family stress: no     Financial stress: no    Job stress: no    Recent death/loss: no  HYPOTHYROIDISM Thyroid control status:controlled Satisfied with current treatment? yes Medication side effects: no Medication compliance: excellent compliance Recent dose adjustment:no Fatigue: no Cold intolerance: no Heat intolerance: no Weight gain: no Weight loss: no Constipation: no Diarrhea/loose stools: no Palpitations: no Lower extremity edema: no Anxiety/depressed mood: yes   She currently lives with: husband and kids Menopausal Symptoms: no  Depression Screen done today and results listed below:  Depression screen [facility] 2/9 01/16/2021 07/18/2020 01/13/2020 07/08/2019 01/04/2019  Decreased Interest 0 0 0 0 0  Down, Depressed, Hopeless 0 0 0 0 0  PHQ - 2 Score 0 0 0 0 0  Altered sleeping 0 0 1 3 0  Tired, decreased energy '1 1 2 1 '$ 0  Change in appetite 0 0 0 0 0  Feeling bad or failure about yourself  0 0 0 0 0  Trouble concentrating 0 0 0 0 0  Moving slowly or fidgety/restless 0 0 0 0 0  Suicidal thoughts 0 0 0 0 0  PHQ-9 Score '1 1 3 4 '$ 0  Difficult doing work/chores Not difficult at all Not difficult at all Not difficult at all Not difficult at all Not difficult at all  Some recent data might be hidden     Past Medical History:  Past Medical History:  Diagnosis Date   Anxiety    Family history of adverse reaction to anesthesia    Mother - low BP   Fibrocystic breast disease    Hypertension    Hypothyroidism    Motion sickness    car back seat    Palpitations    Wears contact lenses     Surgical History:  Past Surgical History:  Procedure Laterality Date   BREAST BIOPSY Right 06/17/2019   stereotactic biopsy, x-clip,  MULTIPLE FRAGMENTS OF CYST WALL FOCAL USUAL DUCTAL HYPERPLASIA, COLUMNAR CELL CHANGE, AND FIBROSIS.   COLONOSCOPY WITH PROPOFOL N/A 01/25/2019   Procedure: COLONOSCOPY WITH PROPOFOL;  Surgeon: Lucilla Lame, MD;   Location: [facility];  Service: Endoscopy;  Laterality: N/A;   LITHOTRIPSY  2012   POLYPECTOMY  01/25/2019   Procedure: POLYPECTOMY;  Surgeon: Lucilla Lame, MD;  Location: [facility];  Service: Endoscopy;;    Medications:  Current Outpatient Medications on File Prior to Visit  Medication Sig   Cholecalciferol (VITAMIN D) 2000 UNITS CAPS Take by mouth.     fluticasone (FLONASE) 50 MCG/ACT nasal spray Place 1 spray into both nostrils 2 (two) times daily.    levothyroxine (SYNTHROID) 25 MCG tablet TAKE 1 TABLET BY MOUTH EVERY DAY   loratadine (CLARITIN) 10 MG tablet Take 10 mg by mouth daily.   Multiple Vitamin (MULTIVITAMIN) capsule Take 1 capsule by mouth daily.     Omega-3 Fatty Acids (FISH OIL PO) Take by mouth daily.   No current facility-administered medications on file prior to visit.    Allergies:  Allergies  Allergen Reactions   Amoxicillin Rash   Omnipaque [Iohexol] Shortness Of Breath   Phenytoin Sodium Extended Swelling   Iodinated Diagnostic Agents Itching   Trileptal [Oxcarbazepine] Swelling    Social History:  Social History   Socioeconomic History   Marital status: Married    Spouse name: Not on file   Number of children: Not on file   Years of education: Not on file   Highest education level: Not on file  Occupational History   Not on file  Tobacco Use   Smoking status: Never   Smokeless tobacco: Never  Vaping Use   Vaping Use: Never used  Substance and Sexual Activity   Alcohol use: No   Drug use: No   Sexual activity: Yes    Birth control/protection: None  Other Topics Concern   Not on file  Social History Narrative   Not on file   Social Determinants of Health   Financial Resource Strain: Not on file  Food Insecurity: Not on file  Transportation Needs: Not on file  Physical Activity: Not on file  Stress: Not on file  Social Connections: Not on file  Intimate Partner Violence: Not on file   Social History   Tobacco  Use  Smoking Status Never  Smokeless Tobacco Never   Social History   Substance and Sexual Activity  Alcohol Use No    Family History:  Family History  Problem Relation Age of Onset   Hyperlipidemia Mother    Hypertension Mother    Arthritis Mother    Cancer Mother        ovarian cancer   Cancer Father    Hypertension Brother    Breast cancer Paternal Aunt 74   Breast cancer Paternal Aunt 53   Stroke Maternal Grandfather    Hypertension Maternal Grandfather    Lymphoma Paternal Grandmother     Past medical history,  surgical history, medications, allergies, family history and social history reviewed with patient today and changes made to appropriate areas of the chart.   Review of Systems  Constitutional: Negative.   HENT: Negative.    Eyes: Negative.   Respiratory: Negative.    Cardiovascular: Negative.   Gastrointestinal:  Positive for heartburn. Negative for abdominal pain, blood in stool, constipation, diarrhea, melena, nausea and vomiting.  Genitourinary: Negative.   Musculoskeletal: Negative.   Skin: Negative.   Neurological: Negative.   Endo/Heme/Allergies:  Positive for environmental allergies. Negative for polydipsia. Does not bruise/bleed easily.  Psychiatric/Behavioral: Negative.    All other ROS negative except what is listed above and in the HPI.      Objective:    BP (!) 134/92   Pulse 98   Temp 98.3 F (36.8 C) (Oral)   Ht '5\' 5"'$  (1.651 m)   Wt 168 lb 6.4 oz (76.4 kg)   SpO2 96%   BMI 28.02 kg/m   Wt Readings from Last 3 Encounters:  01/16/21 168 lb 6.4 oz (76.4 kg)  07/18/20 168 lb (76.2 kg)  01/13/20 164 lb (74.4 kg)    Physical Exam Vitals and nursing note reviewed.  Constitutional:      General: She is not in acute distress.    Appearance: Normal appearance. She is not ill-appearing, toxic-appearing or diaphoretic.  HENT:     Head: Normocephalic and atraumatic.     Right Ear: Tympanic membrane, ear canal and external ear normal.  There is no impacted cerumen.     Left Ear: Tympanic membrane, ear canal and external ear normal. There is no impacted cerumen.     Nose: Nose normal. No congestion or rhinorrhea.     Mouth/Throat:     Mouth: Mucous membranes are moist.     Pharynx: Oropharynx is clear. No oropharyngeal exudate or posterior oropharyngeal erythema.  Eyes:     General: No scleral icterus.       Right eye: No discharge.        Left eye: No discharge.     Extraocular Movements: Extraocular movements intact.     Conjunctiva/sclera: Conjunctivae normal.     Pupils: Pupils are equal, round, and reactive to light.  Neck:     Vascular: No carotid bruit.  Cardiovascular:     Rate and Rhythm: Normal rate and regular rhythm.     Pulses: Normal pulses.     Heart sounds: No murmur heard.   No friction rub. No gallop.  Pulmonary:     Effort: Pulmonary effort is normal. No respiratory distress.     Breath sounds: Normal breath sounds. No stridor. No wheezing, rhonchi or rales.  Chest:     Chest wall: No tenderness.  Abdominal:     General: Abdomen is flat. Bowel sounds are normal. There is no distension.     Palpations: Abdomen is soft. There is no mass.     Tenderness: There is no abdominal tenderness. There is no right CVA tenderness, left CVA tenderness, guarding or rebound.     Hernia: No hernia is present.  Genitourinary:    Comments: Breast and pelvic exams deferred with shared decision making Musculoskeletal:        General: No swelling, tenderness, deformity or signs of injury.     Cervical back: Normal range of motion and neck supple. No rigidity. No muscular tenderness.     Right lower leg: No edema.     Left lower leg: No edema.  Lymphadenopathy:  Cervical: No cervical adenopathy.  Skin:    General: Skin is warm and dry.     Capillary Refill: Capillary refill takes less than 2 seconds.     Coloration: Skin is not jaundiced or pale.     Findings: No bruising, erythema, lesion or rash.   Neurological:     General: No focal deficit present.     Mental Status: She is alert and oriented to person, place, and time. Mental status is at baseline.     Cranial Nerves: No cranial nerve deficit.     Sensory: No sensory deficit.     Motor: No weakness.     Coordination: Coordination normal.     Gait: Gait normal.     Deep Tendon Reflexes: Reflexes normal.  Psychiatric:        Mood and Affect: Mood normal.        Behavior: Behavior normal.        Thought Content: Thought content normal.        Judgment: Judgment normal.    Results for orders placed or performed in visit on [id number]  Basic metabolic panel  Result Value Ref Range   Glucose 92 65 - 99 mg/dL   BUN 14 6 - 24 mg/dL   Creatinine, Ser 0.59 0.57 - 1.00 mg/dL   GFR calc non Af Amer 103 >59 mL/min/1.73   GFR calc Af Amer 118 >59 mL/min/1.73   BUN/Creatinine Ratio 24 (H) 9 - 23   Sodium 142 134 - 144 mmol/L   Potassium 4.4 3.5 - 5.2 mmol/L   Chloride 102 96 - 106 mmol/L   CO2 25 20 - 29 mmol/L   Calcium 9.3 8.7 - 10.2 mg/dL  TSH  Result Value Ref Range   TSH 1.250 0.450 - 4.500 uIU/mL      Assessment & Plan:   Problem List Items Addressed This Visit       Cardiovascular and Mediastinum   Hypertension    Under good control on current regimen. Continue current regimen. Continue to monitor. Call with any concerns. Refills given. Labs drawn today.         Relevant Medications   losartan (COZAAR) 25 MG tablet   Other Relevant Orders   Microalbumin, Urine Waived     Endocrine   Hypothyroidism (Chronic)    Rechecking labs today. Await results. Treat as needed.        Relevant Orders   TSH     Other   Anxiety    Under good control on current regimen. Continue current regimen. Continue to monitor. Call with any concerns. Refills given. Labs drawn today.         Relevant Medications   escitalopram (LEXAPRO) 20 MG tablet   Other Visit Diagnoses     Routine general medical examination at a  health care facility    -  Primary   Vaccines up to date. Screening labs checked today. Pap and colonoscopy up to date. Mammogram ordered. Continue diet and exercise. Call with any concerns.    Relevant Orders   CBC with Differential/Platelet   Comprehensive metabolic panel   Lipid Panel w/o Chol/HDL Ratio   Urinalysis, Routine w reflex microscopic   Encounter for screening mammogram for malignant neoplasm of breast       Mammogram ordered today.   Relevant Orders   MM 3D SCREEN BREAST BILATERAL        Follow up plan: Return in about 6 months (around 07/19/2021).   LABORATORY TESTING:  -  Pap smear: up to date  IMMUNIZATIONS:   - Tdap: Tetanus vaccination status reviewed: last tetanus booster within 10 years. - Influenza: Postponed to flu season - Pneumovax: Not applicable - Prevnar: Not applicable - COVID: Up to date - Shingrix vaccine: Refused  SCREENING: -Mammogram: Ordered today  - Colonoscopy: Up to date  - Bone Density: Not applicable   PATIENT COUNSELING:   Advised to take 1 mg of folate supplement per day if capable of pregnancy.   Sexuality: Discussed sexually transmitted diseases, partner selection, use of condoms, avoidance of unintended pregnancy  and contraceptive alternatives.   Advised to avoid cigarette smoking.  I discussed with the patient that most people either abstain from alcohol or drink within safe limits (<=14/week and <=4 drinks/occasion for males, <=7/weeks and <= 3 drinks/occasion for females) and that the risk for alcohol disorders and other health effects rises proportionally with the number of drinks per week and how often a drinker exceeds daily limits.  Discussed cessation/primary prevention of drug use and availability of treatment for abuse.   Diet: Encouraged to adjust caloric intake to maintain  or achieve ideal body weight, to reduce intake of dietary saturated fat and total fat, to limit sodium intake by avoiding high sodium foods and  not adding table salt, and to maintain adequate dietary potassium and calcium preferably from fresh fruits, vegetables, and low-fat dairy products.    stressed the importance of regular exercise  Injury prevention: Discussed safety belts, safety helmets, smoke detector, smoking near bedding or upholstery.   Dental health: Discussed importance of regular tooth brushing, flossing, and dental visits.    NEXT PREVENTATIVE PHYSICAL DUE IN 1 YEAR. Return in about 6 months (around 07/19/2021).

## 2021-01-17 LAB — COMPREHENSIVE METABOLIC PANEL
ALT: 18 IU/L (ref 0–32)
AST: 25 IU/L (ref 0–40)
Albumin/Globulin Ratio: 2.1 (ref 1.2–2.2)
Albumin: 4.9 g/dL (ref 3.8–4.9)
Alkaline Phosphatase: 115 IU/L (ref 44–121)
BUN/Creatinine Ratio: 18 (ref 9–23)
BUN: 11 mg/dL (ref 6–24)
Bilirubin Total: 0.5 mg/dL (ref 0.0–1.2)
CO2: 26 mmol/L (ref 20–29)
Calcium: 9.9 mg/dL (ref 8.7–10.2)
Chloride: 100 mmol/L (ref 96–106)
Creatinine, Ser: 0.62 mg/dL (ref 0.57–1.00)
Globulin, Total: 2.3 g/dL (ref 1.5–4.5)
Glucose: 87 mg/dL (ref 65–99)
Potassium: 4.3 mmol/L (ref 3.5–5.2)
Sodium: 142 mmol/L (ref 134–144)
Total Protein: 7.2 g/dL (ref 6.0–8.5)
eGFR: 104 mL/min/{1.73_m2} (ref >59)

## 2021-01-17 LAB — CBC WITH DIFFERENTIAL/PLATELET
Basophils Absolute: 0 10*3/uL (ref 0.0–0.2)
Basos: 0 %
EOS (ABSOLUTE): 0.1 10*3/uL (ref 0.0–0.4)
Eos: 2 %
Hematocrit: 44.4 % (ref 34.0–46.6)
Hemoglobin: 14.8 g/dL (ref 11.1–15.9)
Immature Grans (Abs): 0 10*3/uL (ref 0.0–0.1)
Immature Granulocytes: 0 %
Lymphocytes Absolute: 2.5 10*3/uL (ref 0.7–3.1)
Lymphs: 37 %
MCH: 30.1 pg (ref 26.6–33.0)
MCHC: 33.3 g/dL (ref 31.5–35.7)
MCV: 90 fL (ref 79–97)
Monocytes Absolute: 0.4 10*3/uL (ref 0.1–0.9)
Monocytes: 7 %
Neutrophils Absolute: 3.7 10*3/uL (ref 1.4–7.0)
Neutrophils: 54 %
Platelets: 246 10*3/uL (ref 150–450)
RBC: 4.91 x10E6/uL (ref 3.77–5.28)
RDW: 12.6 % (ref 11.7–15.4)
WBC: 6.8 10*3/uL (ref 3.4–10.8)

## 2021-01-17 LAB — LIPID PANEL W/O CHOL/HDL RATIO
Cholesterol, Total: 264 mg/dL — ABNORMAL HIGH (ref 100–199)
HDL: 74 mg/dL (ref >39)
LDL Chol Calc (NIH): 173 mg/dL — ABNORMAL HIGH (ref 0–99)
Triglycerides: 101 mg/dL (ref 0–149)
VLDL Cholesterol Cal: 17 mg/dL (ref 5–40)

## 2021-01-17 LAB — TSH: TSH: 1.64 u[IU]/mL (ref 0.450–4.500)

## 2021-01-28 IMAGING — US US BREAST*R* LIMITED INC AXILLA
1 series · 2 of 2 positions shown · non-contrast
Comparison: Previous exam(s).

CLINICAL DATA: Six-month follow-up of right breast asymmetry.

EXAM:
DIGITAL DIAGNOSTIC RIGHT MAMMOGRAM WITH CAD AND TOMO
ULTRASOUND RIGHT BREAST

[Series 1: us breast*right* limited inc axilla · 0.09mm/px · 2 of 2 slices shown]
[im 1/2]
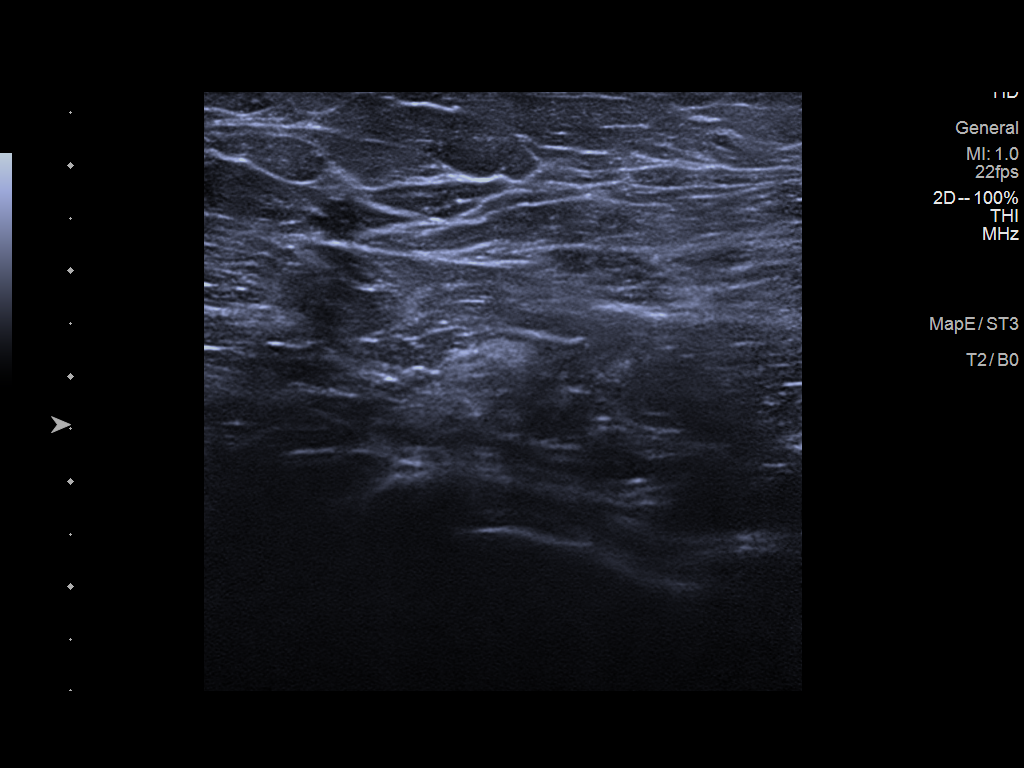
[im 2/2]
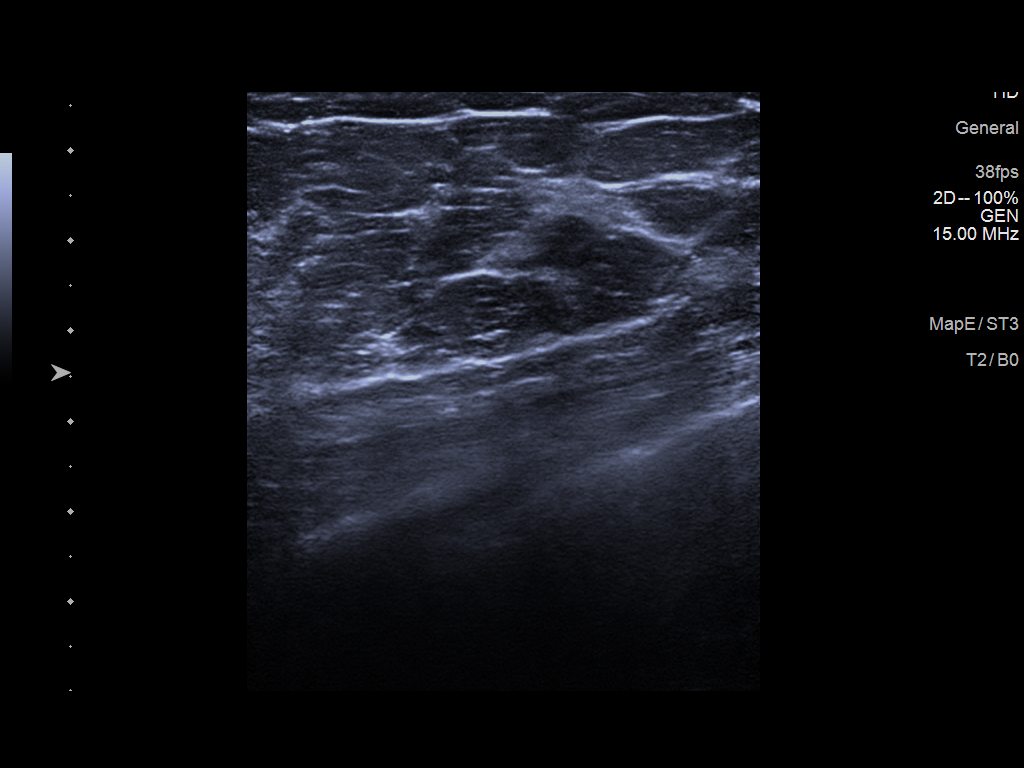

[2 of 2 positions shown; findings below may reference images not displayed]

ACR Breast Density Category c: The breast tissue is heterogeneously
dense, which may obscure small masses.
FINDINGS: The previously identified asymmetry appears to represent a
circumscribed mass on today's study and represents a change compared
to more remote prior films.

Mammographic images were processed with CAD.

On physical exam, no suspicious lumps are identified.

Targeted ultrasound is performed, showing no definite sonographic
correlate.
IMPRESSION: Indeterminate medial right breast mass.

RECOMMENDATION:
Recommend stereotactic biopsy of the medial right breast mass which
is not definitely seen sonographically.

I have discussed the findings and recommendations with the patient.
If applicable, a reminder letter will be sent to the patient
regarding the next appointment.

BI-RADS CATEGORY  4: Suspicious.

## 2021-02-08 ENCOUNTER — Other Ambulatory Visit: Payer: Self-pay | Admitting: Family Medicine

## 2021-02-08 NOTE — Telephone Encounter (Signed)
Requested Prescriptions  Pending Prescriptions Disp Refills  . levothyroxine (SYNTHROID) 25 MCG tablet [Pharmacy Med Name: LEVOTHYROXINE 25 MCG TABLET] 90 tablet 3    Sig: TAKE 1 TABLET BY MOUTH EVERY DAY     Endocrinology:  Hypothyroid Agents Failed - 02/08/2021  1:20 AM      Failed - TSH needs to be rechecked within 3 months after an abnormal result. Refill until TSH is due.      Passed - TSH in normal range and within 360 days    TSH  Date Value Ref Range Status  01/16/2021 1.640 0.450 - 4.500 uIU/mL Final         Passed - Valid encounter within last 12 months    Recent Outpatient Visits          3 weeks ago Routine general medical examination at a health care facility   [facility], [place], DO   6 months ago Primary hypertension   [place], [place], DO   1 year ago Routine general medical examination at a health care facility   [facility], [place], DO   1 year ago Essential hypertension   [place], [place], DO   2 years ago Routine general medical examination at a health care facility   [place], Barb Merino, DO      Future Appointments            In 5 months Wynetta Emery, Barb Merino, DO [facility], [place]

## 2021-02-15 DIAGNOSIS — L578 Other skin changes due to chronic exposure to nonionizing radiation: Secondary | ICD-10-CM | POA: Diagnosis not present

## 2021-02-15 DIAGNOSIS — L570 Actinic keratosis: Secondary | ICD-10-CM | POA: Diagnosis not present

## 2021-03-01 ENCOUNTER — Ambulatory Visit
Admission: RE | Admit: 2021-03-01 | Discharge: 2021-03-01 | Disposition: A | Discharge status: Home / Self Care | Payer: BC Managed Care – PPO | Source: Ambulatory Visit | Attending: Family Medicine | Admitting: Family Medicine

## 2021-03-01 ENCOUNTER — Other Ambulatory Visit: Payer: Self-pay

## 2021-03-01 DIAGNOSIS — Z1231 Encounter for screening mammogram for malignant neoplasm of breast: Secondary | ICD-10-CM | POA: Diagnosis not present

## 2021-07-19 ENCOUNTER — Encounter: Payer: Self-pay | Admitting: Family Medicine

## 2021-07-19 ENCOUNTER — Other Ambulatory Visit: Payer: Self-pay

## 2021-07-19 ENCOUNTER — Ambulatory Visit: Payer: BC Managed Care – PPO | Admitting: Family Medicine

## 2021-07-19 VITALS — BP 140/84 | HR 78 | Temp 98.2°F | Wt 168.4 lb

## 2021-07-19 DIAGNOSIS — E039 Hypothyroidism, unspecified: Secondary | ICD-10-CM | POA: Diagnosis not present

## 2021-07-19 DIAGNOSIS — I10 Essential (primary) hypertension: Secondary | ICD-10-CM

## 2021-07-19 DIAGNOSIS — F419 Anxiety disorder, unspecified: Secondary | ICD-10-CM

## 2021-07-19 MED ORDER — ESCITALOPRAM OXALATE 20 MG PO TABS: 20.0000 mg | ORAL_TABLET | Freq: Every day | ORAL | 1 refills | Status: DC

## 2021-07-19 MED ORDER — LOSARTAN POTASSIUM 25 MG PO TABS: 12.5000 mg | ORAL_TABLET | Freq: Every day | ORAL | 1 refills | Status: DC

## 2021-07-19 MED ORDER — NAPROXEN 500 MG PO TABS
500.0000 mg | ORAL_TABLET | Freq: Two times a day (BID) | ORAL | 0 refills | Status: DC
Start: 2021-07-19 — End: 2021-10-16

## 2021-07-19 MED ORDER — OMEPRAZOLE 20 MG PO CPDR: 20.0000 mg | DELAYED_RELEASE_CAPSULE | Freq: Every day | ORAL | 3 refills | Status: DC

## 2021-07-19 NOTE — Progress Notes (Signed)
BP 140/84    Pulse 78    Temp 98.2 F (36.8 C)    Wt 168 lb 6.4 oz (76.4 kg)    SpO2 97%    BMI 28.02 kg/m    Subjective:    Patient ID: Nichole Gordon, female    DOB: 01/07/1966, 56 y.o.   MRN: [id number]  HPI: Nichole Gordon is a 56 y.o. female  Chief Complaint  Patient presents with   Anxiety   Hypertension   Hypothyroidism   HYPERTENSION Hypertension status: controlled  Satisfied with current treatment? yes Duration of hypertension: chronic BP monitoring frequency:  not checking BP medication side effects:  no Medication compliance: excellent compliance Previous BP meds:losartan Aspirin: no Recurrent headaches: no Visual changes: no Palpitations: no Dyspnea: no Chest pain: no Lower extremity edema: no Dizzy/lightheaded: no  HYPOTHYROIDISM Thyroid control status:controlled Satisfied with current treatment? yes Medication side effects: no Medication compliance: excellent compliance Etiology of hypothyroidism:  Recent dose adjustment:no Fatigue: no Cold intolerance: no Heat intolerance: no Weight gain: no Weight loss: no Constipation: no Diarrhea/loose stools: no Palpitations: no Lower extremity edema: no Anxiety/depressed mood: no  ANXIETY/DEPRESSION Duration: chronic Status:controlled Anxious mood: no  Excessive worrying: no Irritability: no  Sweating: no Nausea: no Palpitations:no Hyperventilation: no Panic attacks: no Agoraphobia: no  Obscessions/compulsions: no Depressed mood: no Depression screen [facility] 2/9 07/19/2021 01/16/2021 07/18/2020 01/13/2020 07/08/2019  Decreased Interest 0 0 0 0 0  Down, Depressed, Hopeless 0 0 0 0 0  PHQ - 2 Score 0 0 0 0 0  Altered sleeping 1 0 0 1 3  Tired, decreased energy 0 '1 1 2 1  ' Change in appetite 0 0 0 0 0  Feeling bad or failure about yourself  0 0 0 0 0  Trouble concentrating 0 0 0 0 0  Moving slowly or fidgety/restless 0 0 0 0 0  Suicidal thoughts 0 0 0 0 0  PHQ-9 Score '1 1 1 3 4  ' Difficult doing  work/chores - Not difficult at all Not difficult at all Not difficult at all Not difficult at all  Some recent data might be hidden   GAD 7 : Generalized Anxiety Score 07/19/2021 01/16/2021 07/18/2020 01/13/2020  Nervous, Anxious, on Edge 0 1 0 1  Control/stop worrying 0 0 0 0  Worry too much - different things 0 0 0 0  Trouble relaxing 0 0 0 0  Restless 0 0 0 0  Easily annoyed or irritable 0 0 0 1  Afraid - awful might happen 0 0 0 0  Total GAD 7 Score 0 1 0 2  Anxiety Difficulty - Not difficult at all Not difficult at all Not difficult at all   Anhedonia: no Weight changes: no Insomnia: no   Hypersomnia: no Fatigue/loss of energy: no Feelings of worthlessness: no Feelings of guilt: no Impaired concentration/indecisiveness: no Suicidal ideations: no  Crying spells: no Recent Stressors/Life Changes: no   Relationship problems: no   Family stress: no     Financial stress: no    Job stress: no    Recent death/loss: no   Relevant past medical, surgical, family and social history reviewed and updated as indicated. Interim medical history since our last visit reviewed. Allergies and medications reviewed and updated.  Review of Systems  Constitutional: Negative.   Respiratory: Negative.    Cardiovascular: Negative.   Gastrointestinal: Negative.   Musculoskeletal: Negative.   Psychiatric/Behavioral: Negative.     Per HPI unless specifically indicated above  Objective:    BP 140/84    Pulse 78    Temp 98.2 F (36.8 C)    Wt 168 lb 6.4 oz (76.4 kg)    SpO2 97%    BMI 28.02 kg/m   Wt Readings from Last 3 Encounters:  07/19/21 168 lb 6.4 oz (76.4 kg)  01/16/21 168 lb 6.4 oz (76.4 kg)  07/18/20 168 lb (76.2 kg)    Physical Exam Vitals and nursing note reviewed.  Constitutional:      General: She is not in acute distress.    Appearance: Normal appearance. She is not ill-appearing, toxic-appearing or diaphoretic.  HENT:     Head: Normocephalic and atraumatic.     Right  Ear: External ear normal.     Left Ear: External ear normal.     Nose: Nose normal.     Mouth/Throat:     Mouth: Mucous membranes are moist.     Pharynx: Oropharynx is clear.  Eyes:     General: No scleral icterus.       Right eye: No discharge.        Left eye: No discharge.     Extraocular Movements: Extraocular movements intact.     Conjunctiva/sclera: Conjunctivae normal.     Pupils: Pupils are equal, round, and reactive to light.  Cardiovascular:     Rate and Rhythm: Normal rate and regular rhythm.     Pulses: Normal pulses.     Heart sounds: Normal heart sounds. No murmur heard.   No friction rub. No gallop.  Pulmonary:     Effort: Pulmonary effort is normal. No respiratory distress.     Breath sounds: Normal breath sounds. No stridor. No wheezing, rhonchi or rales.  Chest:     Chest wall: No tenderness.  Musculoskeletal:        General: Normal range of motion.     Cervical back: Normal range of motion and neck supple.  Skin:    General: Skin is warm and dry.     Capillary Refill: Capillary refill takes less than 2 seconds.     Coloration: Skin is not jaundiced or pale.     Findings: No bruising, erythema, lesion or rash.  Neurological:     General: No focal deficit present.     Mental Status: She is alert and oriented to person, place, and time. Mental status is at baseline.  Psychiatric:        Mood and Affect: Mood normal.        Behavior: Behavior normal.        Thought Content: Thought content normal.        Judgment: Judgment normal.    Results for orders placed or performed in visit on 01/16/21  Microscopic Examination   Urine  Result Value Ref Range   WBC, UA 0-5 0 - 5 /hpf   RBC None seen 0 - 2 /hpf   Epithelial Cells (non renal) 0-10 0 - 10 /hpf   Mucus, UA Present (A) Not Estab.   Bacteria, UA Few (A) None seen/Few  TSH  Result Value Ref Range   TSH 1.640 0.450 - 4.500 uIU/mL  CBC with Differential/Platelet  Result Value Ref Range   WBC 6.8 3.4  - 10.8 x10E3/uL   RBC 4.91 3.77 - 5.28 x10E6/uL   Hemoglobin 14.8 11.1 - 15.9 g/dL   Hematocrit 44.4 34.0 - 46.6 %   MCV 90 79 - 97 fL   MCH 30.1 26.6 - 33.0 pg  MCHC 33.3 31.5 - 35.7 g/dL   RDW 12.6 11.7 - 15.4 %   Platelets 246 150 - 450 x10E3/uL   Neutrophils 54 Not Estab. %   Lymphs 37 Not Estab. %   Monocytes 7 Not Estab. %   Eos 2 Not Estab. %   Basos 0 Not Estab. %   Neutrophils Absolute 3.7 1.4 - 7.0 x10E3/uL   Lymphocytes Absolute 2.5 0.7 - 3.1 x10E3/uL   Monocytes Absolute 0.4 0.1 - 0.9 x10E3/uL   EOS (ABSOLUTE) 0.1 0.0 - 0.4 x10E3/uL   Basophils Absolute 0.0 0.0 - 0.2 x10E3/uL   Immature Granulocytes 0 Not Estab. %   Immature Grans (Abs) 0.0 0.0 - 0.1 x10E3/uL  Comprehensive metabolic panel  Result Value Ref Range   Glucose 87 65 - 99 mg/dL   BUN 11 6 - 24 mg/dL   Creatinine, Ser 0.62 0.57 - 1.00 mg/dL   eGFR 104 >59 mL/min/1.73   BUN/Creatinine Ratio 18 9 - 23   Sodium 142 134 - 144 mmol/L   Potassium 4.3 3.5 - 5.2 mmol/L   Chloride 100 96 - 106 mmol/L   CO2 26 20 - 29 mmol/L   Calcium 9.9 8.7 - 10.2 mg/dL   Total Protein 7.2 6.0 - 8.5 g/dL   Albumin 4.9 3.8 - 4.9 g/dL   Globulin, Total 2.3 1.5 - 4.5 g/dL   Albumin/Globulin Ratio 2.1 1.2 - 2.2   Bilirubin Total 0.5 0.0 - 1.2 mg/dL   Alkaline Phosphatase 115 44 - 121 IU/L   AST 25 0 - 40 IU/L   ALT 18 0 - 32 IU/L  Lipid Panel w/o Chol/HDL Ratio  Result Value Ref Range   Cholesterol, Total 264 (H) 100 - 199 mg/dL   Triglycerides 101 0 - 149 mg/dL   HDL 74 >39 mg/dL   VLDL Cholesterol Cal 17 5 - 40 mg/dL   LDL Chol Calc (NIH) 173 (H) 0 - 99 mg/dL  Urinalysis, Routine w reflex microscopic  Result Value Ref Range   Specific Gravity, UA 1.015 1.005 - 1.030   pH, UA 8.5 (H) 5.0 - 7.5   Color, UA Yellow Yellow   Appearance Ur Clear Clear   Leukocytes,UA 1+ (A) Negative   Protein,UA Negative Negative/Trace   Glucose, UA Negative Negative   Ketones, UA Negative Negative   RBC, UA Negative Negative    Bilirubin, UA Negative Negative   Urobilinogen, Ur 1.0 0.2 - 1.0 mg/dL   Nitrite, UA Negative Negative   Microscopic Examination See below:   Microalbumin, Urine Waived  Result Value Ref Range   Microalb, Ur Waived 10 0 - 19 mg/L   Creatinine, Urine Waived 100 10 - 300 mg/dL   Microalb/Creat Ratio <30 <30 mg/g      Assessment & Plan:   Problem List Items Addressed This Visit       Cardiovascular and Mediastinum   Hypertension - Primary    Under good control on current regimen. Continue current regimen. Continue to monitor. Call with any concerns. Refills given. Labs drawn today.       Relevant Medications   losartan (COZAAR) 25 MG tablet   Other Relevant Orders   Basic metabolic panel     Endocrine   Hypothyroidism (Chronic)    Rechecking labs today. Await results. Treat as needed.       Relevant Orders   TSH     Other   Anxiety    Under good control on current regimen. Continue current regimen. Continue to  monitor. Call with any concerns. Refills given. Labs drawn today.      Relevant Medications   escitalopram (LEXAPRO) 20 MG tablet     Follow up plan: Return in about 6 months (around 01/16/2022) for physical.

## 2021-07-19 NOTE — Assessment & Plan Note (Signed)
Under good control on current regimen. Continue current regimen. Continue to monitor. Call with any concerns. Refills given. Labs drawn today.   

## 2021-07-19 NOTE — Assessment & Plan Note (Signed)
Rechecking labs today. Await results. Treat as needed.  °

## 2021-07-20 LAB — BASIC METABOLIC PANEL
BUN/Creatinine Ratio: 24 — ABNORMAL HIGH (ref 9–23)
BUN: 15 mg/dL (ref 6–24)
CO2: 27 mmol/L (ref 20–29)
Calcium: 9.7 mg/dL (ref 8.7–10.2)
Chloride: 105 mmol/L (ref 96–106)
Creatinine, Ser: 0.63 mg/dL (ref 0.57–1.00)
Glucose: 90 mg/dL (ref 70–99)
Potassium: 4.7 mmol/L (ref 3.5–5.2)
Sodium: 145 mmol/L — ABNORMAL HIGH (ref 134–144)
eGFR: 103 mL/min/{1.73_m2} (ref >59)

## 2021-07-20 LAB — TSH: TSH: 1.12 u[IU]/mL (ref 0.450–4.500)

## 2021-10-15 ENCOUNTER — Other Ambulatory Visit: Payer: Self-pay | Admitting: Family Medicine

## 2021-10-16 NOTE — Telephone Encounter (Signed)
Requested Prescriptions  ?Pending Prescriptions Disp Refills  ?? naproxen (NAPROSYN) 500 MG tablet [Pharmacy Med Name: NAPROXEN 500 MG TABLET] 180 tablet 0  ?  Sig: TAKE 1 TABLET BY MOUTH 2 TIMES DAILY WITH A MEAL.  ?  ? Analgesics:  NSAIDS Failed - 10/15/2021  2:23 AM  ?  ?  Failed - Manual Review: Labs are only required if the patient has taken medication for more than 8 weeks.  ?  ?  Passed - Cr in normal range and within 360 days  ?  Creat  ?Date Value Ref Range Status  ?02/23/2013 0.53 0.50 - 1.10 mg/dL Final  ? ?Creatinine, Ser  ?Date Value Ref Range Status  ?07/19/2021 0.63 0.57 - 1.00 mg/dL Final  ?   ?  ?  Passed - HGB in normal range and within 360 days  ?  Hemoglobin  ?Date Value Ref Range Status  ?01/16/2021 14.8 11.1 - 15.9 g/dL Final  ?   ?  ?  Passed - PLT in normal range and within 360 days  ?  Platelets  ?Date Value Ref Range Status  ?01/16/2021 246 150 - 450 x10E3/uL Final  ?   ?  ?  Passed - HCT in normal range and within 360 days  ?  Hematocrit  ?Date Value Ref Range Status  ?01/16/2021 44.4 34.0 - 46.6 % Final  ?   ?  ?  Passed - eGFR is 30 or above and within 360 days  ?  GFR calc Af Amer  ?Date Value Ref Range Status  ?07/18/2020 118 >59 mL/min/1.73 Final  ?  Comment:  ?  **In accordance with recommendations from the NKF-ASN Task force,** ?  [facility] is in the process of updating its eGFR calculation to the ?  2021 CKD-EPI creatinine equation that estimates kidney function ?  without a race variable. ?  ? ?GFR calc non Af Amer  ?Date Value Ref Range Status  ?07/18/2020 103 >59 mL/min/1.73 Final  ? ?eGFR  ?Date Value Ref Range Status  ?07/19/2021 103 >59 mL/min/1.73 Final  ?   ?  ?  Passed - Patient is not pregnant  ?  ?  Passed - Valid encounter within last 12 months  ?  Recent Outpatient Visits   ?      ? 2 months ago Primary hypertension  ? [place], DO  ? 9 months ago Routine general medical examination at a health care facility  ? [place], Megan P, DO  ? 1 year ago Primary hypertension  ? [place], DO  ? 1 year ago Routine general medical examination at a health care facility  ? [place], DO  ? 2 years ago Essential hypertension  ? [place], [place], DO  ?  ?  ?Future Appointments   ?        ? In 3 months Wynetta Emery, Barb Merino, DO [facility]  ?  ? ?  ?  ?  ? ?

## 2021-10-23 DIAGNOSIS — H348322 Tributary (branch) retinal vein occlusion, left eye, stable: Secondary | ICD-10-CM | POA: Diagnosis not present

## 2022-01-22 ENCOUNTER — Encounter: Payer: BC Managed Care – PPO | Admitting: Family Medicine

## 2022-02-06 ENCOUNTER — Other Ambulatory Visit: Payer: Self-pay | Admitting: Family Medicine

## 2022-02-06 NOTE — Telephone Encounter (Signed)
Requested Prescriptions  Pending Prescriptions Disp Refills  . levothyroxine (SYNTHROID) 25 MCG tablet [Pharmacy Med Name: LEVOTHYROXINE 25 MCG TABLET] 90 tablet 1    Sig: TAKE 1 TABLET BY MOUTH EVERY DAY     Endocrinology:  Hypothyroid Agents Passed - 02/06/2022  1:49 AM      Passed - TSH in normal range and within 360 days    TSH  Date Value Ref Range Status  07/19/2021 1.120 0.450 - 4.500 uIU/mL Final         Passed - Valid encounter within last 12 months    Recent Outpatient Visits          6 months ago Primary hypertension   [place], Megan P, DO   1 year ago Routine general medical examination at a health care facility   [place], [place], DO   1 year ago Primary hypertension   [place], [place], DO   2 years ago Routine general medical examination at a health care facility   [place], [place], DO   2 years ago Essential hypertension   [facility] Valerie [place], DO      Future Appointments            In 1 week Wynetta Emery, Barb Merino, DO [facility]

## 2022-02-14 ENCOUNTER — Ambulatory Visit (INDEPENDENT_AMBULATORY_CARE_PROVIDER_SITE_OTHER): Payer: 59 | Admitting: Family Medicine

## 2022-02-14 ENCOUNTER — Encounter: Payer: Self-pay | Admitting: Family Medicine

## 2022-02-14 VITALS — BP 122/86 | HR 85 | Temp 98.3°F | Ht 63.5 in | Wt 174.8 lb

## 2022-02-14 DIAGNOSIS — E039 Hypothyroidism, unspecified: Secondary | ICD-10-CM

## 2022-02-14 DIAGNOSIS — I10 Essential (primary) hypertension: Secondary | ICD-10-CM

## 2022-02-14 DIAGNOSIS — F419 Anxiety disorder, unspecified: Secondary | ICD-10-CM | POA: Diagnosis not present

## 2022-02-14 DIAGNOSIS — Z0000 Encounter for general adult medical examination without abnormal findings: Secondary | ICD-10-CM | POA: Diagnosis not present

## 2022-02-14 LAB — URINALYSIS, ROUTINE W REFLEX MICROSCOPIC
Bilirubin, UA: NEGATIVE
Glucose, UA: NEGATIVE
Ketones, UA: NEGATIVE
Nitrite, UA: NEGATIVE
Protein,UA: NEGATIVE
RBC, UA: NEGATIVE
Specific Gravity, UA: 1.015 (ref 1.005–1.030)
Urobilinogen, Ur: 1 mg/dL (ref 0.2–1.0)
pH, UA: 7.5 (ref 5.0–7.5)

## 2022-02-14 LAB — MICROALBUMIN, URINE WAIVED
Creatinine, Urine Waived: 50 mg/dL (ref 10–300)
Microalb, Ur Waived: 10 mg/L (ref 0–19)
Microalb/Creat Ratio: <30 mg/g (ref <30)

## 2022-02-14 LAB — MICROSCOPIC EXAMINATION: RBC, Urine: NONE SEEN /hpf (ref 0–2)

## 2022-02-14 MED ORDER — ESCITALOPRAM OXALATE 20 MG PO TABS: 20.0000 mg | ORAL_TABLET | Freq: Every day | ORAL | 1 refills | Status: DC

## 2022-02-14 MED ORDER — OMEPRAZOLE 20 MG PO CPDR
20.0000 mg | DELAYED_RELEASE_CAPSULE | Freq: Every day | ORAL | 1 refills | Status: DC
Start: 2022-02-14 — End: 2023-03-03

## 2022-02-14 MED ORDER — NAPROXEN 500 MG PO TABS: 500.0000 mg | ORAL_TABLET | Freq: Two times a day (BID) | ORAL | 1 refills | Status: DC

## 2022-02-14 MED ORDER — LOSARTAN POTASSIUM 25 MG PO TABS: 12.5000 mg | ORAL_TABLET | Freq: Every day | ORAL | 1 refills | Status: DC

## 2022-02-14 NOTE — Progress Notes (Signed)
BP 122/86   Pulse 85   Temp 98.3 F (36.8 C)   Ht 5' 3.5" (1.613 m)   Wt 174 lb 12.8 oz (79.3 kg)   SpO2 97%   BMI 30.48 kg/m    Subjective:    Patient ID: Nichole Gordon, female    DOB: 06-03-1966, 56 y.o.   MRN: [id number]  HPI: Nichole Gordon is a 56 y.o. female presenting on 02/14/2022 for comprehensive medical examination. Current medical complaints include:  HYPOTHYROIDISM Thyroid control status:controlled Satisfied with current treatment? yes Medication side effects: no Medication compliance: excellent compliance Recent dose adjustment:no Fatigue: no Cold intolerance: no Heat intolerance: no Weight gain: no Weight loss: no Constipation: no Diarrhea/loose stools: no Palpitations: no Lower extremity edema: no Anxiety/depressed mood: no  ANXIETY/DEPRESSION Duration:controlled Anxious mood: no  Excessive worrying: no Irritability: no  Sweating: no Nausea: no Palpitations:no Hyperventilation: no Panic attacks: no Agoraphobia: no  Obscessions/compulsions: no Depressed mood: no    02/14/2022    9:16 AM 07/19/2021    8:16 AM 01/16/2021    8:25 AM 07/18/2020    8:14 AM 01/13/2020   11:51 AM  Depression screen PHQ 2/9  Decreased Interest 0 0 0 0 0  Down, Depressed, Hopeless 0 0 0 0 0  PHQ - 2 Score 0 0 0 0 0  Altered sleeping 0 1 0 0 1  Tired, decreased energy 1 0 '1 1 2  '$ Change in appetite 0 0 0 0 0  Feeling bad or failure about yourself  0 0 0 0 0  Trouble concentrating 0 0 0 0 0  Moving slowly or fidgety/restless 0 0 0 0 0  Suicidal thoughts 0 0 0 0 0  PHQ-9 Score '1 1 1 1 3  '$ Difficult doing work/chores Not difficult at all  Not difficult at all Not difficult at all Not difficult at all   Anhedonia: no Weight changes: no Insomnia: no   Hypersomnia: no Fatigue/loss of energy: no Feelings of worthlessness: no Feelings of guilt: no Impaired concentration/indecisiveness: no Suicidal ideations: no  Crying spells: no Recent Stressors/Life Changes: no    Relationship problems: no   Family stress: no     Financial stress: no    Job stress: no    Recent death/loss: no  HYPERTENSION without Chronic Kidney Disease Hypertension status: controlled  Satisfied with current treatment? yes Duration of hypertension: chronic BP monitoring frequency:  not checking BP medication side effects:  no Medication compliance: excellent compliance Previous BP meds:losartan Aspirin: no Recurrent headaches: no Visual changes: no Palpitations: no Dyspnea: no Chest pain: no Lower extremity edema: no Dizzy/lightheaded: no  She currently lives with: husband Menopausal Symptoms: no  Depression Screen done today and results listed below:     02/14/2022    9:16 AM 07/19/2021    8:16 AM 01/16/2021    8:25 AM 07/18/2020    8:14 AM 01/13/2020   11:51 AM  Depression screen PHQ 2/9  Decreased Interest 0 0 0 0 0  Down, Depressed, Hopeless 0 0 0 0 0  PHQ - 2 Score 0 0 0 0 0  Altered sleeping 0 1 0 0 1  Tired, decreased energy 1 0 '1 1 2  '$ Change in appetite 0 0 0 0 0  Feeling bad or failure about yourself  0 0 0 0 0  Trouble concentrating 0 0 0 0 0  Moving slowly or fidgety/restless 0 0 0 0 0  Suicidal thoughts 0 0 0 0 0  PHQ-9  Score '1 1 1 1 3  '$ Difficult doing work/chores Not difficult at all  Not difficult at all Not difficult at all Not difficult at all    Past Medical History:  Past Medical History:  Diagnosis Date   Anxiety    Family history of adverse reaction to anesthesia    Mother - low BP   Fibrocystic breast disease    Hypertension    Hypothyroidism    Motion sickness    car back seat    Palpitations    Wears contact lenses     Surgical History:  Past Surgical History:  Procedure Laterality Date   BREAST BIOPSY Right 06/17/2019   stereotactic biopsy, x-clip,  MULTIPLE FRAGMENTS OF CYST WALL FOCAL USUAL DUCTAL HYPERPLASIA, COLUMNAR CELL CHANGE, AND FIBROSIS.   COLONOSCOPY WITH PROPOFOL N/A 01/25/2019   Procedure: COLONOSCOPY WITH  PROPOFOL;  Surgeon: Lucilla Lame, MD;  Location: [facility];  Service: Endoscopy;  Laterality: N/A;   LITHOTRIPSY  2012   POLYPECTOMY  01/25/2019   Procedure: POLYPECTOMY;  Surgeon: Lucilla Lame, MD;  Location: [facility];  Service: Endoscopy;;    Medications:  Current Outpatient Medications on File Prior to Visit  Medication Sig   Cholecalciferol (VITAMIN D) 2000 UNITS CAPS Take by mouth.     fluticasone (FLONASE) 50 MCG/ACT nasal spray Place 1 spray into both nostrils 2 (two) times daily.    levothyroxine (SYNTHROID) 25 MCG tablet TAKE 1 TABLET BY MOUTH EVERY DAY   loratadine (CLARITIN) 10 MG tablet Take 10 mg by mouth daily.   Multiple Vitamin (MULTIVITAMIN) capsule Take 1 capsule by mouth daily.     Omega-3 Fatty Acids (FISH OIL PO) Take by mouth daily.   No current facility-administered medications on file prior to visit.    Allergies:  Allergies  Allergen Reactions   Amoxicillin Rash   Omnipaque [Iohexol] Shortness Of Breath   Phenytoin Sodium Extended Swelling   Iodinated Contrast Media Itching   Trileptal [Oxcarbazepine] Swelling    Social History:  Social History   Socioeconomic History   Marital status: Married    Spouse name: Not on file   Number of children: Not on file   Years of education: Not on file   Highest education level: Not on file  Occupational History   Not on file  Tobacco Use   Smoking status: Never   Smokeless tobacco: Never  Vaping Use   Vaping Use: Never used  Substance and Sexual Activity   Alcohol use: No   Drug use: No   Sexual activity: Yes    Birth control/protection: None  Other Topics Concern   Not on file  Social History Narrative   Not on file   Social Determinants of Health   Financial Resource Strain: Not on file  Food Insecurity: Not on file  Transportation Needs: Not on file  Physical Activity: Not on file  Stress: Not on file  Social Connections: Not on file  Intimate Partner Violence: Not on  file   Social History   Tobacco Use  Smoking Status Never  Smokeless Tobacco Never   Social History   Substance and Sexual Activity  Alcohol Use No    Family History:  Family History  Problem Relation Age of Onset   Hyperlipidemia Mother    Hypertension Mother    Arthritis Mother    Cancer Mother        ovarian cancer   Cancer Father    Hypertension Brother    Breast cancer  Paternal Aunt 54   Breast cancer Paternal Aunt 35   Stroke Maternal Grandfather    Hypertension Maternal Grandfather    Lymphoma Paternal Grandmother     Past medical history, surgical history, medications, allergies, family history and social history reviewed with patient today and changes made to appropriate areas of the chart.   Review of Systems  Constitutional: Negative.   HENT: Negative.    Eyes: Negative.   Respiratory: Negative.    Cardiovascular:  Positive for leg swelling. Negative for chest pain, palpitations, orthopnea, claudication and PND.  Skin: Negative.    All other ROS negative except what is listed above and in the HPI.      Objective:    BP 122/86   Pulse 85   Temp 98.3 F (36.8 C)   Ht 5' 3.5" (1.613 m)   Wt 174 lb 12.8 oz (79.3 kg)   SpO2 97%   BMI 30.48 kg/m   Wt Readings from Last 3 Encounters:  02/14/22 174 lb 12.8 oz (79.3 kg)  07/19/21 168 lb 6.4 oz (76.4 kg)  01/16/21 168 lb 6.4 oz (76.4 kg)    Physical Exam Vitals and nursing note reviewed.  Constitutional:      General: She is not in acute distress.    Appearance: Normal appearance. She is not ill-appearing, toxic-appearing or diaphoretic.  HENT:     Head: Normocephalic and atraumatic.     Right Ear: Tympanic membrane, ear canal and external ear normal. There is no impacted cerumen.     Left Ear: Tympanic membrane, ear canal and external ear normal. There is no impacted cerumen.     Nose: Nose normal. No congestion or rhinorrhea.     Mouth/Throat:     Mouth: Mucous membranes are moist.      Pharynx: Oropharynx is clear. No oropharyngeal exudate or posterior oropharyngeal erythema.  Eyes:     General: No scleral icterus.       Right eye: No discharge.        Left eye: No discharge.     Extraocular Movements: Extraocular movements intact.     Conjunctiva/sclera: Conjunctivae normal.     Pupils: Pupils are equal, round, and reactive to light.  Neck:     Vascular: No carotid bruit.  Cardiovascular:     Rate and Rhythm: Normal rate and regular rhythm.     Pulses: Normal pulses.     Heart sounds: No murmur heard.    No friction rub. No gallop.  Pulmonary:     Effort: Pulmonary effort is normal. No respiratory distress.     Breath sounds: Normal breath sounds. No stridor. No wheezing, rhonchi or rales.  Chest:     Chest wall: No tenderness.  Abdominal:     General: Abdomen is flat. Bowel sounds are normal. There is no distension.     Palpations: Abdomen is soft. There is no mass.     Tenderness: There is no abdominal tenderness. There is no right CVA tenderness, left CVA tenderness, guarding or rebound.     Hernia: No hernia is present.  Genitourinary:    Comments: Breast and pelvic exams deferred with shared decision making Musculoskeletal:        General: No swelling, tenderness, deformity or signs of injury.     Cervical back: Normal range of motion and neck supple. No rigidity. No muscular tenderness.     Right lower leg: No edema.     Left lower leg: No edema.  Lymphadenopathy:  Cervical: No cervical adenopathy.  Skin:    General: Skin is warm and dry.     Capillary Refill: Capillary refill takes less than 2 seconds.     Coloration: Skin is not jaundiced or pale.     Findings: No bruising, erythema, lesion or rash.  Neurological:     General: No focal deficit present.     Mental Status: She is alert and oriented to person, place, and time. Mental status is at baseline.     Cranial Nerves: No cranial nerve deficit.     Sensory: No sensory deficit.     Motor:  No weakness.     Coordination: Coordination normal.     Gait: Gait normal.     Deep Tendon Reflexes: Reflexes normal.  Psychiatric:        Mood and Affect: Mood normal.        Behavior: Behavior normal.        Thought Content: Thought content normal.        Judgment: Judgment normal.     Results for orders placed or performed in visit on 02/14/22  Microscopic Examination   Urine  Result Value Ref Range   WBC, UA 0-5 0 - 5 /hpf   RBC, Urine None seen 0 - 2 /hpf   Epithelial Cells (non renal) 0-10 0 - 10 /hpf   Bacteria, UA Few (A) None seen/Few  Urinalysis, Routine w reflex microscopic  Result Value Ref Range   Specific Gravity, UA 1.015 1.005 - 1.030   pH, UA 7.5 5.0 - 7.5   Color, UA Yellow Yellow   Appearance Ur Clear Clear   Leukocytes,UA Trace (A) Negative   Protein,UA Negative Negative/Trace   Glucose, UA Negative Negative   Ketones, UA Negative Negative   RBC, UA Negative Negative   Bilirubin, UA Negative Negative   Urobilinogen, Ur 1.0 0.2 - 1.0 mg/dL   Nitrite, UA Negative Negative   Microscopic Examination See below:   Microalbumin, Urine Waived  Result Value Ref Range   Microalb, Ur Waived 10 0 - 19 mg/L   Creatinine, Urine Waived 50 10 - 300 mg/dL   Microalb/Creat Ratio <30 <30 mg/g      Assessment & Plan:   Problem List Items Addressed This Visit       Cardiovascular and Mediastinum   Hypertension    Under good control on current regimen. Continue current regimen. Continue to monitor. Call with any concerns. Refills given. Labs drawn today.       Relevant Medications   losartan (COZAAR) 25 MG tablet   Other Relevant Orders   Comprehensive metabolic panel   Microalbumin, Urine Waived (Completed)     Endocrine   Hypothyroidism (Chronic)    Labs drawn today. Await results. Treat as needed.       Relevant Orders   Comprehensive metabolic panel   TSH     Other   Anxiety    Under good control on current regimen. Continue current regimen.  Continue to monitor. Call with any concerns. Refills given. Labs drawn today.       Relevant Medications   escitalopram (LEXAPRO) 20 MG tablet   Other Relevant Orders   Comprehensive metabolic panel   Other Visit Diagnoses     Routine general medical examination at a health care facility    -  Primary   Vaccines up to date. Screening labs checked today. Pap, colonoscopy, mammogram up to date. Continue diet and exercise. Call with any concerns.  Relevant Orders   CBC with Differential/Platelet   Comprehensive metabolic panel   Lipid Panel w/o Chol/HDL Ratio   Urinalysis, Routine w reflex microscopic (Completed)   TSH   Microalbumin, Urine Waived (Completed)        Follow up plan: Return in about 6 months (around 08/15/2022).   LABORATORY TESTING:  - Pap smear: up to date  IMMUNIZATIONS:   - Tdap: Tetanus vaccination status reviewed: last tetanus booster within 10 years. - Influenza: Postponed to flu season - Pneumovax: Not applicable - Prevnar: Not applicable - COVID: Up to date - HPV: Not applicable - Shingrix vaccine: Refused  SCREENING: -Mammogram: Ordered today  - Colonoscopy: Up to date   PATIENT COUNSELING:   Advised to take 1 mg of folate supplement per day if capable of pregnancy.   Sexuality: Discussed sexually transmitted diseases, partner selection, use of condoms, avoidance of unintended pregnancy  and contraceptive alternatives.   Advised to avoid cigarette smoking.  I discussed with the patient that most people either abstain from alcohol or drink within safe limits (<=14/week and <=4 drinks/occasion for males, <=7/weeks and <= 3 drinks/occasion for females) and that the risk for alcohol disorders and other health effects rises proportionally with the number of drinks per week and how often a drinker exceeds daily limits.  Discussed cessation/primary prevention of drug use and availability of treatment for abuse.   Diet: Encouraged to adjust  caloric intake to maintain  or achieve ideal body weight, to reduce intake of dietary saturated fat and total fat, to limit sodium intake by avoiding high sodium foods and not adding table salt, and to maintain adequate dietary potassium and calcium preferably from fresh fruits, vegetables, and low-fat dairy products.    stressed the importance of regular exercise  Injury prevention: Discussed safety belts, safety helmets, smoke detector, smoking near bedding or upholstery.   Dental health: Discussed importance of regular tooth brushing, flossing, and dental visits.    NEXT PREVENTATIVE PHYSICAL DUE IN 1 YEAR. Return in about 6 months (around 08/15/2022).

## 2022-02-14 NOTE — Assessment & Plan Note (Signed)
Labs drawn today. Await results. Treat as needed.  

## 2022-02-14 NOTE — Assessment & Plan Note (Signed)
Under good control on current regimen. Continue current regimen. Continue to monitor. Call with any concerns. Refills given. Labs drawn today.   

## 2022-02-15 LAB — LIPID PANEL W/O CHOL/HDL RATIO
Cholesterol, Total: 256 mg/dL — ABNORMAL HIGH (ref 100–199)
HDL: 76 mg/dL (ref >39)
LDL Chol Calc (NIH): 162 mg/dL — ABNORMAL HIGH (ref 0–99)
Triglycerides: 102 mg/dL (ref 0–149)
VLDL Cholesterol Cal: 18 mg/dL (ref 5–40)

## 2022-02-15 LAB — COMPREHENSIVE METABOLIC PANEL
ALT: 19 IU/L (ref 0–32)
AST: 26 IU/L (ref 0–40)
Albumin/Globulin Ratio: 2.2 (ref 1.2–2.2)
Albumin: 4.8 g/dL (ref 3.8–4.9)
Alkaline Phosphatase: 106 IU/L (ref 44–121)
BUN/Creatinine Ratio: 25 — ABNORMAL HIGH (ref 9–23)
BUN: 15 mg/dL (ref 6–24)
Bilirubin Total: 0.5 mg/dL (ref 0.0–1.2)
CO2: 26 mmol/L (ref 20–29)
Calcium: 9.8 mg/dL (ref 8.7–10.2)
Chloride: 103 mmol/L (ref 96–106)
Creatinine, Ser: 0.6 mg/dL (ref 0.57–1.00)
Globulin, Total: 2.2 g/dL (ref 1.5–4.5)
Glucose: 88 mg/dL (ref 70–99)
Potassium: 4.7 mmol/L (ref 3.5–5.2)
Sodium: 142 mmol/L (ref 134–144)
Total Protein: 7 g/dL (ref 6.0–8.5)
eGFR: 104 mL/min/{1.73_m2} (ref >59)

## 2022-02-15 LAB — CBC WITH DIFFERENTIAL/PLATELET
Basophils Absolute: 0 10*3/uL (ref 0.0–0.2)
Basos: 1 %
EOS (ABSOLUTE): 0.1 10*3/uL (ref 0.0–0.4)
Eos: 2 %
Hematocrit: 42.8 % (ref 34.0–46.6)
Hemoglobin: 14.3 g/dL (ref 11.1–15.9)
Immature Grans (Abs): 0 10*3/uL (ref 0.0–0.1)
Immature Granulocytes: 0 %
Lymphocytes Absolute: 2.5 10*3/uL (ref 0.7–3.1)
Lymphs: 38 %
MCH: 30 pg (ref 26.6–33.0)
MCHC: 33.4 g/dL (ref 31.5–35.7)
MCV: 90 fL (ref 79–97)
Monocytes Absolute: 0.5 10*3/uL (ref 0.1–0.9)
Monocytes: 7 %
Neutrophils Absolute: 3.3 10*3/uL (ref 1.4–7.0)
Neutrophils: 52 %
Platelets: 248 10*3/uL (ref 150–450)
RBC: 4.76 x10E6/uL (ref 3.77–5.28)
RDW: 12.5 % (ref 11.7–15.4)
WBC: 6.5 10*3/uL (ref 3.4–10.8)

## 2022-02-15 LAB — TSH: TSH: 0.991 u[IU]/mL (ref 0.450–4.500)

## 2022-02-19 DIAGNOSIS — L578 Other skin changes due to chronic exposure to nonionizing radiation: Secondary | ICD-10-CM | POA: Diagnosis not present

## 2022-02-19 DIAGNOSIS — D485 Neoplasm of uncertain behavior of skin: Secondary | ICD-10-CM | POA: Diagnosis not present

## 2022-02-19 DIAGNOSIS — Z872 Personal history of diseases of the skin and subcutaneous tissue: Secondary | ICD-10-CM | POA: Diagnosis not present

## 2022-02-19 DIAGNOSIS — D225 Melanocytic nevi of trunk: Secondary | ICD-10-CM | POA: Diagnosis not present

## 2022-03-27 ENCOUNTER — Other Ambulatory Visit: Payer: Self-pay | Admitting: Family Medicine

## 2022-03-27 DIAGNOSIS — Z1231 Encounter for screening mammogram for malignant neoplasm of breast: Secondary | ICD-10-CM

## 2022-04-25 ENCOUNTER — Ambulatory Visit
Admission: RE | Admit: 2022-04-25 | Discharge: 2022-04-25 | Disposition: A | Discharge status: Home / Self Care | Payer: No Typology Code available for payment source | Source: Ambulatory Visit | Attending: Family Medicine | Admitting: Family Medicine

## 2022-04-25 DIAGNOSIS — Z1231 Encounter for screening mammogram for malignant neoplasm of breast: Secondary | ICD-10-CM | POA: Insufficient documentation

## 2022-08-06 ENCOUNTER — Other Ambulatory Visit: Payer: Self-pay | Admitting: Family Medicine

## 2022-08-07 NOTE — Telephone Encounter (Signed)
Requested Prescriptions  Pending Prescriptions Disp Refills   levothyroxine (SYNTHROID) 25 MCG tablet [Pharmacy Med Name: LEVOTHYROXINE 25 MCG TABLET] 90 tablet 1    Sig: TAKE 1 TABLET BY MOUTH EVERY DAY     Endocrinology:  Hypothyroid Agents Passed - 08/06/2022  2:57 AM      Passed - TSH in normal range and within 360 days    TSH  Date Value Ref Range Status  02/14/2022 0.991 0.450 - 4.500 uIU/mL Final         Passed - Valid encounter within last 12 months    Recent Outpatient Visits           5 months ago Routine general medical examination at a health care facility   [place], [place], DO   1 year ago Primary hypertension   [place], [place], DO   1 year ago Routine general medical examination at a health care facility   [place], [place], DO   2 years ago Primary hypertension   [place], [place], DO   2 years ago Routine general medical examination at a health care facility   [facility] Valerie Roys, DO       Future Appointments             In 1 week Valerie Roys, DO [place]

## 2022-08-15 ENCOUNTER — Ambulatory Visit: Payer: No Typology Code available for payment source | Admitting: Family Medicine

## 2022-08-15 ENCOUNTER — Encounter: Payer: Self-pay | Admitting: Family Medicine

## 2022-08-15 VITALS — BP 139/75 | HR 85 | Temp 98.0°F | Ht 63.5 in | Wt 178.0 lb

## 2022-08-15 DIAGNOSIS — I10 Essential (primary) hypertension: Secondary | ICD-10-CM | POA: Diagnosis not present

## 2022-08-15 DIAGNOSIS — E039 Hypothyroidism, unspecified: Secondary | ICD-10-CM

## 2022-08-15 DIAGNOSIS — E782 Mixed hyperlipidemia: Secondary | ICD-10-CM

## 2022-08-15 DIAGNOSIS — F419 Anxiety disorder, unspecified: Secondary | ICD-10-CM | POA: Diagnosis not present

## 2022-08-15 HISTORY — DX: Mixed hyperlipidemia: E78.2

## 2022-08-15 NOTE — Assessment & Plan Note (Addendum)
Stable. Not currently on prescription regimen, recommend continued exercise, instructions provided for diet. Labs drawn today, will treat as needed.

## 2022-08-15 NOTE — Progress Notes (Signed)
BP 139/75   Pulse 85   Temp 98 F (36.7 C) (Oral)   Ht 5' 3.5" (1.613 m)   Wt 178 lb (80.7 kg)   SpO2 98%   BMI 31.04 kg/m    Subjective:    Patient ID: Nichole Gordon, female    DOB: 07-02-1965, 57 y.o.   MRN: [id number]  HPI: Nichole Gordon is a 57 y.o. female  Chief Complaint  Patient presents with   Hypertension   Hypothyroidism   HYPERTENSION without Chronic Kidney Disease Taking Losartan daily Hypertension status: stable  Satisfied with current treatment? no Duration of hypertension: chronic BP monitoring frequency:  not checking BP range: Not checking BP medication side effects:  no Medication compliance: excellent compliance Aspirin: no Recurrent headaches: no Visual changes: no Palpitations: no Dyspnea: no Chest pain: no Lower extremity edema: no Dizzy/lightheaded: no   HYPOTHYROIDISM Taking Synthroid daily Thyroid control status:stable Satisfied with current treatment? yes Medication side effects: no Medication compliance: excellent compliance Etiology of hypothyroidism:  Recent dose adjustment:no Fatigue: yes; most days but not everyday Cold intolerance: no Heat intolerance: No; hot flashes Weight gain: yes; +4lbs since last visit, working out with trainer x3 weekly; working on [employer] prep plan Weight loss: no Constipation: no Diarrhea/loose stools: no Palpitations: no Lower extremity edema: no Anxiety/depressed mood: no   Relevant past medical, surgical, family and social history reviewed and updated as indicated. Interim medical history since our last visit reviewed. Allergies and medications reviewed and updated.  Review of Systems  Constitutional:  Negative for fatigue and unexpected weight change.  Eyes:  Negative for visual disturbance.  Respiratory:  Negative for shortness of breath.   Cardiovascular:  Negative for chest pain, palpitations and leg swelling.  Gastrointestinal:  Negative for constipation and diarrhea.   Endocrine: Negative for cold intolerance and heat intolerance.  Neurological:  Negative for dizziness, light-headedness and headaches.  Psychiatric/Behavioral:  Negative for dysphoric mood. The patient is not nervous/anxious.     Per HPI unless specifically indicated above     Objective:    BP 139/75   Pulse 85   Temp 98 F (36.7 C) (Oral)   Ht 5' 3.5" (1.613 m)   Wt 178 lb (80.7 kg)   SpO2 98%   BMI 31.04 kg/m   Wt Readings from Last 3 Encounters:  08/15/22 178 lb (80.7 kg)  02/14/22 174 lb 12.8 oz (79.3 kg)  07/19/21 168 lb 6.4 oz (76.4 kg)    Physical Exam Vitals and nursing note reviewed.  Constitutional:      General: She is awake. She is not in acute distress.    Appearance: She is well-developed and well-groomed. She is not ill-appearing.  HENT:     Head: Normocephalic and atraumatic.     Right Ear: Hearing and external ear normal. No drainage.     Left Ear: Hearing and external ear normal. No drainage.     Nose: Nose normal.  Eyes:     General: Lids are normal.        Right eye: No discharge.        Left eye: No discharge.     Conjunctiva/sclera: Conjunctivae normal.  Cardiovascular:     Rate and Rhythm: Normal rate and regular rhythm.     Heart sounds: Normal heart sounds, S1 normal and S2 normal. No murmur heard.    No gallop.  Pulmonary:     Effort: No accessory muscle usage or respiratory distress.     Breath  sounds: Normal breath sounds.  Musculoskeletal:        General: Normal range of motion.     Right lower leg: No edema.     Left lower leg: No edema.  Skin:    General: Skin is warm and dry.     Capillary Refill: Capillary refill takes less than 2 seconds.  Neurological:     Mental Status: She is alert and oriented to person, place, and time.  Psychiatric:        Attention and Perception: Attention normal.        Mood and Affect: Mood normal.        Speech: Speech normal.        Behavior: Behavior normal. Behavior is cooperative.         Thought Content: Thought content normal.     Results for orders placed or performed in visit on 02/14/22  Microscopic Examination   Urine  Result Value Ref Range   WBC, UA 0-5 0 - 5 /hpf   RBC, Urine None seen 0 - 2 /hpf   Epithelial Cells (non renal) 0-10 0 - 10 /hpf   Bacteria, UA Few (A) None seen/Few  CBC with Differential/Platelet  Result Value Ref Range   WBC 6.5 3.4 - 10.8 x10E3/uL   RBC 4.76 3.77 - 5.28 x10E6/uL   Hemoglobin 14.3 11.1 - 15.9 g/dL   Hematocrit 42.8 34.0 - 46.6 %   MCV 90 79 - 97 fL   MCH 30.0 26.6 - 33.0 pg   MCHC 33.4 31.5 - 35.7 g/dL   RDW 12.5 11.7 - 15.4 %   Platelets 248 150 - 450 x10E3/uL   Neutrophils 52 Not Estab. %   Lymphs 38 Not Estab. %   Monocytes 7 Not Estab. %   Eos 2 Not Estab. %   Basos 1 Not Estab. %   Neutrophils Absolute 3.3 1.4 - 7.0 x10E3/uL   Lymphocytes Absolute 2.5 0.7 - 3.1 x10E3/uL   Monocytes Absolute 0.5 0.1 - 0.9 x10E3/uL   EOS (ABSOLUTE) 0.1 0.0 - 0.4 x10E3/uL   Basophils Absolute 0.0 0.0 - 0.2 x10E3/uL   Immature Granulocytes 0 Not Estab. %   Immature Grans (Abs) 0.0 0.0 - 0.1 x10E3/uL  Comprehensive metabolic panel  Result Value Ref Range   Glucose 88 70 - 99 mg/dL   BUN 15 6 - 24 mg/dL   Creatinine, Ser 0.60 0.57 - 1.00 mg/dL   eGFR 104 >59 mL/min/1.73   BUN/Creatinine Ratio 25 (H) 9 - 23   Sodium 142 134 - 144 mmol/L   Potassium 4.7 3.5 - 5.2 mmol/L   Chloride 103 96 - 106 mmol/L   CO2 26 20 - 29 mmol/L   Calcium 9.8 8.7 - 10.2 mg/dL   Total Protein 7.0 6.0 - 8.5 g/dL   Albumin 4.8 3.8 - 4.9 g/dL   Globulin, Total 2.2 1.5 - 4.5 g/dL   Albumin/Globulin Ratio 2.2 1.2 - 2.2   Bilirubin Total 0.5 0.0 - 1.2 mg/dL   Alkaline Phosphatase 106 44 - 121 IU/L   AST 26 0 - 40 IU/L   ALT 19 0 - 32 IU/L  Lipid Panel w/o Chol/HDL Ratio  Result Value Ref Range   Cholesterol, Total 256 (H) 100 - 199 mg/dL   Triglycerides 102 0 - 149 mg/dL   HDL 76 >39 mg/dL   VLDL Cholesterol Cal 18 5 - 40 mg/dL   LDL Chol Calc  (NIH) 162 (H) 0 - 99 mg/dL  Urinalysis, Routine  w reflex microscopic  Result Value Ref Range   Specific Gravity, UA 1.015 1.005 - 1.030   pH, UA 7.5 5.0 - 7.5   Color, UA Yellow Yellow   Appearance Ur Clear Clear   Leukocytes,UA Trace (A) Negative   Protein,UA Negative Negative/Trace   Glucose, UA Negative Negative   Ketones, UA Negative Negative   RBC, UA Negative Negative   Bilirubin, UA Negative Negative   Urobilinogen, Ur 1.0 0.2 - 1.0 mg/dL   Nitrite, UA Negative Negative   Microscopic Examination See below:   TSH  Result Value Ref Range   TSH 0.991 0.450 - 4.500 uIU/mL  Microalbumin, Urine Waived  Result Value Ref Range   Microalb, Ur Waived 10 0 - 19 mg/L   Creatinine, Urine Waived 50 10 - 300 mg/dL   Microalb/Creat Ratio <30 <30 mg/g      Assessment & Plan:   Problem List Items Addressed This Visit       Cardiovascular and Mediastinum   Hypertension - Primary    Chronic, stable. Continue taking Losartan 12.5 mg daily. Recommend Check BP at home and record readings. Labs done today. Will benefit from continued exercise with trainer and instructions provided on healthy diet. Return in 6 months.       Relevant Orders   CBC With Diff/Platelet   Vitamin 123456   Basic Metabolic Panel (BMET)     Endocrine   Hypothyroidism (Chronic)    Chronic, stable. Continue taking levothyroxine 25 mcg daily. Labs done today, will treat as needed. Return in 6 months.       Relevant Orders   Vitamin B12   TSH     Other   Mixed hyperlipidemia    Stable. Not currently on prescription regimen, recommend continued exercise, instructions provided for diet. Labs drawn today, will treat as needed.       Relevant Orders   Lipid Panel w/o Chol/HDL Ratio     Follow up plan: Return in about 6 months (around 02/13/2023) for physical.

## 2022-08-15 NOTE — Assessment & Plan Note (Addendum)
Chronic, stable. Continue taking Losartan 12.5 mg daily. Recommend Check BP at home and record readings. Labs done today. Will benefit from continued exercise with trainer and instructions provided on healthy diet. Return in 6 months.

## 2022-08-15 NOTE — Progress Notes (Signed)
BP 139/75   Pulse 85   Temp 98 F (36.7 C) (Oral)   Ht 5' 3.5" (1.613 m)   Wt 178 lb (80.7 kg)   SpO2 98%   BMI 31.04 kg/m    Subjective:    Patient ID: Nichole Gordon, female    DOB: May 01, 1966, 57 y.o.   MRN: [id number]  HPI: Nichole Gordon is a 57 y.o. female  Chief Complaint  Patient presents with   Hypertension   Hypothyroidism   HYPERTENSION  Hypertension status: controlled  Satisfied with current treatment? yes Duration of hypertension: chronic BP monitoring frequency:  not checking BP medication side effects:  no Medication compliance: excellent compliance Previous BP meds:losartan Aspirin: no Recurrent headaches: no Visual changes: no Palpitations: no Dyspnea: no Chest pain: no Lower extremity edema: no Dizzy/lightheaded: no  HYPOTHYROIDISM Thyroid control status:controlled Satisfied with current treatment? yes Medication side effects: no Medication compliance: excellent compliance Recent dose adjustment:no Fatigue: yes Cold intolerance: no Heat intolerance: no Weight gain: no Weight loss: no Constipation: no Diarrhea/loose stools: no Palpitations: no Lower extremity edema: no Anxiety/depressed mood: no  ANXIETY/STRESS Duration: chronic Status:controlled Anxious mood: no  Excessive worrying: no Irritability: no  Sweating: no Nausea: no Palpitations:no Hyperventilation: no Panic attacks: no Agoraphobia: no  Obscessions/compulsions: no Depressed mood: no    08/15/2022    9:32 AM 02/14/2022    9:16 AM 07/19/2021    8:16 AM 01/16/2021    8:25 AM 07/18/2020    8:14 AM  Depression screen PHQ 2/9  Decreased Interest 0 0 0 0 0  Down, Depressed, Hopeless 0 0 0 0 0  PHQ - 2 Score 0 0 0 0 0  Altered sleeping 0 0 1 0 0  Tired, decreased energy 1 1 0 1 1  Change in appetite 0 0 0 0 0  Feeling bad or failure about yourself  0 0 0 0 0  Trouble concentrating 0 0 0 0 0  Moving slowly or fidgety/restless 0 0 0 0 0  Suicidal thoughts 0 0 0 0 0   PHQ-9 Score '1 1 1 1 1  '$ Difficult doing work/chores  Not difficult at all  Not difficult at all Not difficult at all   Anhedonia: no Weight changes: no Insomnia: no   Hypersomnia: no Fatigue/loss of energy: no Feelings of worthlessness: no Feelings of guilt: no Impaired concentration/indecisiveness: no Suicidal ideations: no  Crying spells: no Recent Stressors/Life Changes: no   Relationship problems: no   Family stress: no     Financial stress: no    Job stress: no    Recent death/loss: no  Relevant past medical, surgical, family and social history reviewed and updated as indicated. Interim medical history since our last visit reviewed. Allergies and medications reviewed and updated.  Review of Systems  Constitutional:  Positive for fatigue. Negative for activity change, appetite change, chills, diaphoresis, fever and unexpected weight change.  Respiratory: Negative.    Cardiovascular: Negative.   Musculoskeletal: Negative.   Neurological: Negative.   Psychiatric/Behavioral: Negative.      Per HPI unless specifically indicated above     Objective:    BP 139/75   Pulse 85   Temp 98 F (36.7 C) (Oral)   Ht 5' 3.5" (1.613 m)   Wt 178 lb (80.7 kg)   SpO2 98%   BMI 31.04 kg/m   Wt Readings from Last 3 Encounters:  08/15/22 178 lb (80.7 kg)  02/14/22 174 lb 12.8 oz (79.3 kg)  07/19/21  168 lb 6.4 oz (76.4 kg)    Physical Exam Vitals and nursing note reviewed.  Constitutional:      General: She is not in acute distress.    Appearance: Normal appearance. She is normal weight. She is not ill-appearing, toxic-appearing or diaphoretic.  HENT:     Head: Normocephalic and atraumatic.     Right Ear: External ear normal.     Left Ear: External ear normal.     Nose: Nose normal.     Mouth/Throat:     Mouth: Mucous membranes are moist.     Pharynx: Oropharynx is clear.  Eyes:     General: No scleral icterus.       Right eye: No discharge.        Left eye: No  discharge.     Extraocular Movements: Extraocular movements intact.     Conjunctiva/sclera: Conjunctivae normal.     Pupils: Pupils are equal, round, and reactive to light.  Cardiovascular:     Rate and Rhythm: Normal rate and regular rhythm.     Pulses: Normal pulses.     Heart sounds: Normal heart sounds. No murmur heard.    No friction rub. No gallop.  Pulmonary:     Effort: Pulmonary effort is normal. No respiratory distress.     Breath sounds: Normal breath sounds. No stridor. No wheezing, rhonchi or rales.  Chest:     Chest wall: No tenderness.  Musculoskeletal:        General: Normal range of motion.     Cervical back: Normal range of motion and neck supple.  Skin:    General: Skin is warm and dry.     Capillary Refill: Capillary refill takes less than 2 seconds.     Coloration: Skin is not jaundiced or pale.     Findings: No bruising, erythema, lesion or rash.  Neurological:     General: No focal deficit present.     Mental Status: She is alert and oriented to person, place, and time. Mental status is at baseline.  Psychiatric:        Mood and Affect: Mood normal.        Behavior: Behavior normal.        Thought Content: Thought content normal.        Judgment: Judgment normal.     Results for orders placed or performed in visit on 08/15/22  CBC With Diff/Platelet  Result Value Ref Range   WBC 7.4 3.4 - 10.8 x10E3/uL   RBC 4.83 3.77 - 5.28 x10E6/uL   Hemoglobin 14.3 11.1 - 15.9 g/dL   Hematocrit 42.5 34.0 - 46.6 %   MCV 88 79 - 97 fL   MCH 29.6 26.6 - 33.0 pg   MCHC 33.6 31.5 - 35.7 g/dL   RDW 12.2 11.7 - 15.4 %   Platelets 251 150 - 450 x10E3/uL   Neutrophils 53 Not Estab. %   Lymphs 36 Not Estab. %   Monocytes 8 Not Estab. %   Eos 2 Not Estab. %   Basos 0 Not Estab. %   Neutrophils Absolute 3.9 1.4 - 7.0 x10E3/uL   Lymphocytes Absolute 2.7 0.7 - 3.1 x10E3/uL   Monocytes Absolute 0.6 0.1 - 0.9 x10E3/uL   EOS (ABSOLUTE) 0.1 0.0 - 0.4 x10E3/uL   Basophils  Absolute 0.0 0.0 - 0.2 x10E3/uL   Immature Granulocytes 1 Not Estab. %   Immature Grans (Abs) 0.1 0.0 - 0.1 x10E3/uL  Vitamin B12  Result Value Ref Range  Vitamin B-12 720 232 - 1,245 pg/mL  TSH  Result Value Ref Range   TSH 1.480 0.450 - 4.500 uIU/mL  Basic Metabolic Panel (BMET)  Result Value Ref Range   Glucose 88 70 - 99 mg/dL   BUN 13 6 - 24 mg/dL   Creatinine, Ser 0.65 0.57 - 1.00 mg/dL   eGFR 102 >59 mL/min/1.73   BUN/Creatinine Ratio 20 9 - 23   Sodium 140 134 - 144 mmol/L   Potassium 4.4 3.5 - 5.2 mmol/L   Chloride 100 96 - 106 mmol/L   CO2 25 20 - 29 mmol/L   Calcium 10.1 8.7 - 10.2 mg/dL  Lipid Panel w/o Chol/HDL Ratio  Result Value Ref Range   Cholesterol, Total 245 (H) 100 - 199 mg/dL   Triglycerides 147 0 - 149 mg/dL   HDL 62 >39 mg/dL   VLDL Cholesterol Cal 26 5 - 40 mg/dL   LDL Chol Calc (NIH) 157 (H) 0 - 99 mg/dL      Assessment & Plan:   Problem List Items Addressed This Visit       Cardiovascular and Mediastinum   Hypertension - Primary    Chronic, stable. Continue taking Losartan 12.5 mg daily. Recommend Check BP at home and record readings. Labs done today. Will benefit from continued exercise with trainer and instructions provided on healthy diet. Return in 6 months.       Relevant Orders   CBC With Diff/Platelet (Completed)   Vitamin B12 (Completed)   Basic Metabolic Panel (BMET) (Completed)     Endocrine   Hypothyroidism (Chronic)    Chronic, stable. Continue taking levothyroxine 25 mcg daily. Labs done today, will treat as needed. Return in 6 months.       Relevant Orders   Vitamin B12 (Completed)   TSH (Completed)     Other   Anxiety    Under good control on current regimen. Continue current regimen. Continue to monitor. Call with any concerns. Refills given.        Mixed hyperlipidemia    Stable. Not currently on prescription regimen, recommend continued exercise, instructions provided for diet. Labs drawn today, will treat as  needed.       Relevant Orders   Lipid Panel w/o Chol/HDL Ratio (Completed)     Follow up plan: Return in about 6 months (around 02/13/2023) for physical.

## 2022-08-15 NOTE — Assessment & Plan Note (Addendum)
Chronic, stable. Continue taking levothyroxine 25 mcg daily. Labs done today, will treat as needed. Return in 6 months.

## 2022-08-16 DIAGNOSIS — I10 Essential (primary) hypertension: Secondary | ICD-10-CM | POA: Diagnosis not present

## 2022-08-16 LAB — LIPID PANEL W/O CHOL/HDL RATIO
Cholesterol, Total: 245 mg/dL — ABNORMAL HIGH (ref 100–199)
HDL: 62 mg/dL (ref >39)
LDL Chol Calc (NIH): 157 mg/dL — ABNORMAL HIGH (ref 0–99)
Triglycerides: 147 mg/dL (ref 0–149)
VLDL Cholesterol Cal: 26 mg/dL (ref 5–40)

## 2022-08-16 LAB — TSH: TSH: 1.48 u[IU]/mL (ref 0.450–4.500)

## 2022-08-16 LAB — BASIC METABOLIC PANEL
BUN/Creatinine Ratio: 20 (ref 9–23)
BUN: 13 mg/dL (ref 6–24)
CO2: 25 mmol/L (ref 20–29)
Calcium: 10.1 mg/dL (ref 8.7–10.2)
Chloride: 100 mmol/L (ref 96–106)
Creatinine, Ser: 0.65 mg/dL (ref 0.57–1.00)
Glucose: 88 mg/dL (ref 70–99)
Potassium: 4.4 mmol/L (ref 3.5–5.2)
Sodium: 140 mmol/L (ref 134–144)
eGFR: 102 mL/min/{1.73_m2} (ref >59)

## 2022-08-16 LAB — VITAMIN B12: Vitamin B-12: 720 pg/mL (ref 232–1245)

## 2022-08-17 LAB — CBC WITH DIFF/PLATELET
Basophils Absolute: 0 10*3/uL (ref 0.0–0.2)
Basos: 0 %
EOS (ABSOLUTE): 0.1 10*3/uL (ref 0.0–0.4)
Eos: 2 %
Hematocrit: 42.5 % (ref 34.0–46.6)
Hemoglobin: 14.3 g/dL (ref 11.1–15.9)
Immature Grans (Abs): 0.1 10*3/uL (ref 0.0–0.1)
Immature Granulocytes: 1 %
Lymphocytes Absolute: 2.7 10*3/uL (ref 0.7–3.1)
Lymphs: 36 %
MCH: 29.6 pg (ref 26.6–33.0)
MCHC: 33.6 g/dL (ref 31.5–35.7)
MCV: 88 fL (ref 79–97)
Monocytes Absolute: 0.6 10*3/uL (ref 0.1–0.9)
Monocytes: 8 %
Neutrophils Absolute: 3.9 10*3/uL (ref 1.4–7.0)
Neutrophils: 53 %
Platelets: 251 10*3/uL (ref 150–450)
RBC: 4.83 x10E6/uL (ref 3.77–5.28)
RDW: 12.2 % (ref 11.7–15.4)
WBC: 7.4 10*3/uL (ref 3.4–10.8)

## 2022-08-18 ENCOUNTER — Other Ambulatory Visit: Payer: Self-pay | Admitting: Family Medicine

## 2022-08-18 MED ORDER — LEVOTHYROXINE SODIUM 25 MCG PO TABS: 25.0000 ug | ORAL_TABLET | Freq: Every day | ORAL | 3 refills | Status: DC

## 2022-08-18 NOTE — Assessment & Plan Note (Signed)
Under good control on current regimen. Continue current regimen. Continue to monitor. Call with any concerns. Refills given.   

## 2022-08-20 ENCOUNTER — Other Ambulatory Visit: Payer: Self-pay | Admitting: Family Medicine

## 2022-09-03 DIAGNOSIS — L578 Other skin changes due to chronic exposure to nonionizing radiation: Secondary | ICD-10-CM | POA: Diagnosis not present

## 2022-09-03 DIAGNOSIS — Z872 Personal history of diseases of the skin and subcutaneous tissue: Secondary | ICD-10-CM | POA: Diagnosis not present

## 2022-09-03 DIAGNOSIS — Z86018 Personal history of other benign neoplasm: Secondary | ICD-10-CM | POA: Diagnosis not present

## 2022-09-03 DIAGNOSIS — Z808 Family history of malignant neoplasm of other organs or systems: Secondary | ICD-10-CM | POA: Diagnosis not present

## 2022-10-03 ENCOUNTER — Other Ambulatory Visit: Payer: Self-pay | Admitting: Family Medicine

## 2022-10-03 NOTE — Telephone Encounter (Signed)
Requested Prescriptions  Pending Prescriptions Disp Refills   losartan (COZAAR) 25 MG tablet [Pharmacy Med Name: LOSARTAN POTASSIUM 25 MG TAB] 45 tablet 0    Sig: TAKE 1/2 TABLET BY MOUTH EVERY DAY     Cardiovascular:  Angiotensin Receptor Blockers Passed - 10/03/2022  1:41 AM      Passed - Cr in normal range and within 180 days    Creat  Date Value Ref Range Status  02/23/2013 0.53 0.50 - 1.10 mg/dL Final   Creatinine, Ser  Date Value Ref Range Status  08/15/2022 0.65 0.57 - 1.00 mg/dL Final         Passed - K in normal range and within 180 days    Potassium  Date Value Ref Range Status  08/15/2022 4.4 3.5 - 5.2 mmol/L Final         Passed - Patient is not pregnant      Passed - Last BP in normal range    BP Readings from Last 1 Encounters:  08/15/22 139/75         Passed - Valid encounter within last 6 months    Recent Outpatient Visits           1 month ago Primary hypertension   [facility] [place], Megan P, DO   7 months ago Routine general medical examination at a health care facility   [facility] [place], [place] P, DO   1 year ago Primary hypertension   [place] [facility] [place], [place] P, DO   1 year ago Routine general medical examination at a health care facility   [facility] [place], [place] P, DO   2 years ago Primary hypertension   [place] [place], Oralia Rud, DO       Future Appointments             In 4 months Laural Benes, Oralia Rud, DO [facility]

## 2023-01-12 ENCOUNTER — Other Ambulatory Visit: Payer: Self-pay | Admitting: Family Medicine

## 2023-01-14 NOTE — Telephone Encounter (Signed)
Requested Prescriptions  Pending Prescriptions Disp Refills   losartan (COZAAR) 25 MG tablet [Pharmacy Med Name: LOSARTAN POTASSIUM 25 MG TAB] 45 tablet 0    Sig: TAKE 1/2 TABLET BY MOUTH DAILY     Cardiovascular:  Angiotensin Receptor Blockers Passed - 01/12/2023  9:37 AM      Passed - Cr in normal range and within 180 days    Creat  Date Value Ref Range Status  02/23/2013 0.53 0.50 - 1.10 mg/dL Final   Creatinine, Ser  Date Value Ref Range Status  08/15/2022 0.65 0.57 - 1.00 mg/dL Final         Passed - K in normal range and within 180 days    Potassium  Date Value Ref Range Status  08/15/2022 4.4 3.5 - 5.2 mmol/L Final         Passed - Patient is not pregnant      Passed - Last BP in normal range    BP Readings from Last 1 Encounters:  08/15/22 139/75         Passed - Valid encounter within last 6 months    Recent Outpatient Visits           5 months ago Primary hypertension   [facility] [place], Megan P, DO   11 months ago Routine general medical examination at a health care facility   [facility] [place], [place] P, DO   1 year ago Primary hypertension   [place] [facility] [place], [place] P, DO   1 year ago Routine general medical examination at a health care facility   [facility] [place], [place] P, DO   2 years ago Primary hypertension   [place] [facility] [place], Oralia Rud, DO       Future Appointments             In 1 month Laural Benes, Oralia Rud, DO [place] [facility]

## 2023-01-31 ENCOUNTER — Encounter: Payer: Self-pay | Admitting: Family Medicine

## 2023-02-13 ENCOUNTER — Ambulatory Visit: Payer: No Typology Code available for payment source | Admitting: Family Medicine

## 2023-02-20 ENCOUNTER — Other Ambulatory Visit: Payer: Self-pay | Admitting: Family Medicine

## 2023-02-20 ENCOUNTER — Encounter: Payer: No Typology Code available for payment source | Admitting: Family Medicine

## 2023-02-21 NOTE — Telephone Encounter (Signed)
Requested Prescriptions  Pending Prescriptions Disp Refills   escitalopram (LEXAPRO) 20 MG tablet [Pharmacy Med Name: ESCITALOPRAM 20 MG TABLET] 90 tablet 0    Sig: TAKE 1 TABLET BY MOUTH EVERY DAY     Psychiatry:  Antidepressants - SSRI Failed - 02/20/2023  2:38 AM      Failed - Valid encounter within last 6 months    Recent Outpatient Visits           6 months ago Primary hypertension   [place] [place], Megan P, DO   1 year ago Routine general medical examination at a health care facility   [facility] [place], [place] P, DO   1 year ago Primary hypertension   [place] [facility] [place], [place] P, DO   2 years ago Routine general medical examination at a health care facility   [facility] [place], [place] P, DO   2 years ago Primary hypertension   [place] [facility] [place], Oralia Rud, DO       Future Appointments             In 1 week Laural Benes, Oralia Rud, DO [facility]

## 2023-03-03 ENCOUNTER — Encounter: Payer: Self-pay | Admitting: Family Medicine

## 2023-03-03 ENCOUNTER — Other Ambulatory Visit (HOSPITAL_COMMUNITY)
Admission: RE | Admit: 2023-03-03 | Discharge: 2023-03-03 | Disposition: A | Discharge status: Home / Self Care | Payer: 59 | Source: Ambulatory Visit | Attending: Family Medicine | Admitting: Family Medicine

## 2023-03-03 ENCOUNTER — Ambulatory Visit (INDEPENDENT_AMBULATORY_CARE_PROVIDER_SITE_OTHER): Payer: 59 | Admitting: Family Medicine

## 2023-03-03 VITALS — BP 145/83 | HR 88 | Temp 98.0°F | Ht 65.0 in | Wt 174.4 lb

## 2023-03-03 DIAGNOSIS — Z0000 Encounter for general adult medical examination without abnormal findings: Secondary | ICD-10-CM | POA: Insufficient documentation

## 2023-03-03 DIAGNOSIS — F419 Anxiety disorder, unspecified: Secondary | ICD-10-CM

## 2023-03-03 DIAGNOSIS — Z1231 Encounter for screening mammogram for malignant neoplasm of breast: Secondary | ICD-10-CM

## 2023-03-03 DIAGNOSIS — I10 Essential (primary) hypertension: Secondary | ICD-10-CM | POA: Diagnosis not present

## 2023-03-03 DIAGNOSIS — E039 Hypothyroidism, unspecified: Secondary | ICD-10-CM

## 2023-03-03 DIAGNOSIS — E782 Mixed hyperlipidemia: Secondary | ICD-10-CM

## 2023-03-03 LAB — MICROSCOPIC EXAMINATION: Bacteria, UA: NONE SEEN

## 2023-03-03 LAB — URINALYSIS, ROUTINE W REFLEX MICROSCOPIC
Bilirubin, UA: NEGATIVE
Glucose, UA: NEGATIVE
Ketones, UA: NEGATIVE
Nitrite, UA: NEGATIVE
Protein,UA: NEGATIVE
RBC, UA: NEGATIVE
Specific Gravity, UA: 1.02 (ref 1.005–1.030)
Urobilinogen, Ur: 4 mg/dL — ABNORMAL HIGH (ref 0.2–1.0)
pH, UA: 7 (ref 5.0–7.5)

## 2023-03-03 LAB — MICROALBUMIN, URINE WAIVED
Creatinine, Urine Waived: 100 mg/dL (ref 10–300)
Microalb, Ur Waived: 30 mg/L — ABNORMAL HIGH (ref 0–19)
Microalb/Creat Ratio: <30 mg/g (ref <30)

## 2023-03-03 MED ORDER — ESCITALOPRAM OXALATE 20 MG PO TABS: 20.0000 mg | ORAL_TABLET | Freq: Every day | ORAL | 1 refills | Status: DC

## 2023-03-03 MED ORDER — NAPROXEN 500 MG PO TABS: 500.0000 mg | ORAL_TABLET | Freq: Two times a day (BID) | ORAL | 1 refills | Status: DC

## 2023-03-03 MED ORDER — OMEPRAZOLE 20 MG PO CPDR: 20.0000 mg | DELAYED_RELEASE_CAPSULE | Freq: Every day | ORAL | 1 refills | Status: DC

## 2023-03-03 MED ORDER — LOSARTAN POTASSIUM 25 MG PO TABS: 25.0000 mg | ORAL_TABLET | Freq: Every day | ORAL | 1 refills | Status: DC

## 2023-03-03 NOTE — Assessment & Plan Note (Signed)
BP running high- will increase her losartan to 25mg  and recheck in 6-8 weeks. Call with any concerns.

## 2023-03-03 NOTE — Assessment & Plan Note (Signed)
Rechecking labs today. Await results. Treat as needed.  °

## 2023-03-03 NOTE — Progress Notes (Signed)
BP (!) 145/83   Pulse 88   Temp 98 F (36.7 C) (Oral)   Ht 5\' 5"  (1.651 m)   Wt 174 lb 6.4 oz (79.1 kg)   SpO2 98%   BMI 29.02 kg/m    Subjective:    Patient ID: Nichole Gordon, female    DOB: 08-25-1965, 57 y.o.   MRN: [id number]  HPI: Nichole Gordon is a 57 y.o. female presenting on 03/03/2023 for comprehensive medical examination. Current medical complaints include:  HYPOTHYROIDISM Thyroid control status:controlled Satisfied with current treatment? yes Medication side effects: no Medication compliance: excellent compliance Recent dose adjustment:no Fatigue: no Cold intolerance: no Heat intolerance: no Weight gain: no Weight loss: no Constipation: no Diarrhea/loose stools: no Palpitations: no Lower extremity edema: no Anxiety/depressed mood: no  ANXIETY/DEPRESSION Duration: chronic Status:controlled Anxious mood: no  Excessive worrying: no Irritability: no  Sweating: no Nausea: no Palpitations:no Hyperventilation: no Panic attacks: no Agoraphobia: no  Obscessions/compulsions: no Depressed mood: no    03/03/2023   10:36 AM 08/15/2022    9:32 AM 02/14/2022    9:16 AM 07/19/2021    8:16 AM 01/16/2021    8:25 AM  Depression screen PHQ 2/9  Decreased Interest 0 0 0 0 0  Down, Depressed, Hopeless 0 0 0 0 0  PHQ - 2 Score 0 0 0 0 0  Altered sleeping 0 0 0 1 0  Tired, decreased energy 1 1 1  0 1  Change in appetite 0 0 0 0 0  Feeling bad or failure about yourself  0 0 0 0 0  Trouble concentrating 0 0 0 0 0  Moving slowly or fidgety/restless 0 0 0 0 0  Suicidal thoughts 0 0 0 0 0  PHQ-9 Score 1 1 1 1 1   Difficult doing work/chores Not difficult at all  Not difficult at all  Not difficult at all   Anhedonia: no Weight changes: no Insomnia: no   Hypersomnia: no Fatigue/loss of energy: no Feelings of worthlessness: no Feelings of guilt: no Impaired concentration/indecisiveness: no Suicidal ideations: no  Crying spells: no Recent Stressors/Life Changes:  no   Relationship problems: no   Family stress: no     Financial stress: no    Job stress: no    Recent death/loss: no  HYPERTENSION / HYPERLIPIDEMIA Satisfied with current treatment? yes Duration of hypertension: chronic BP monitoring frequency: rarely BP medication side effects: no Past BP meds: losartan Duration of hyperlipidemia: chronic Cholesterol medication side effects: no Cholesterol supplements: fish oil Past cholesterol medications: none Medication compliance: excellent compliance Aspirin: no Recent stressors: no Recurrent headaches: no Visual changes: no Palpitations: no Dyspnea: no Chest pain: no Lower extremity edema: no Dizzy/lightheaded: no  She currently lives with: husband Menopausal Symptoms: no  Depression Screen done today and results listed below:     03/03/2023   10:36 AM 08/15/2022    9:32 AM 02/14/2022    9:16 AM 07/19/2021    8:16 AM 01/16/2021    8:25 AM  Depression screen PHQ 2/9  Decreased Interest 0 0 0 0 0  Down, Depressed, Hopeless 0 0 0 0 0  PHQ - 2 Score 0 0 0 0 0  Altered sleeping 0 0 0 1 0  Tired, decreased energy 1 1 1  0 1  Change in appetite 0 0 0 0 0  Feeling bad or failure about yourself  0 0 0 0 0  Trouble concentrating 0 0 0 0 0  Moving slowly or fidgety/restless 0 0  0 0 0  Suicidal thoughts 0 0 0 0 0  PHQ-9 Score 1 1 1 1 1   Difficult doing work/chores Not difficult at all  Not difficult at all  Not difficult at all    Past Medical History:  Past Medical History:  Diagnosis Date   Anxiety    Family history of adverse reaction to anesthesia    Mother - low BP   Fibrocystic breast disease    Hypertension    Hypothyroidism    Mixed hyperlipidemia 08/15/2022   Motion sickness    car back seat    Palpitations    Wears contact lenses     Surgical History:  Past Surgical History:  Procedure Laterality Date   BREAST BIOPSY Right 06/17/2019   stereotactic biopsy, x-clip,  MULTIPLE FRAGMENTS OF CYST WALL FOCAL USUAL  DUCTAL HYPERPLASIA, COLUMNAR CELL CHANGE, AND FIBROSIS.   COLONOSCOPY WITH PROPOFOL N/A 01/25/2019   Procedure: COLONOSCOPY WITH PROPOFOL;  Surgeon: Midge Minium, MD;  Location: [facility];  Service: Endoscopy;  Laterality: N/A;   LITHOTRIPSY  2012   POLYPECTOMY  01/25/2019   Procedure: POLYPECTOMY;  Surgeon: Midge Minium, MD;  Location: [facility];  Service: Endoscopy;;    Medications:  Current Outpatient Medications on File Prior to Visit  Medication Sig   Cholecalciferol (VITAMIN D) 2000 UNITS CAPS Take by mouth.     fluticasone (FLONASE) 50 MCG/ACT nasal spray Place 1 spray into both nostrils 2 (two) times daily.    levothyroxine (SYNTHROID) 25 MCG tablet Take 1 tablet (25 mcg total) by mouth daily.   loratadine (CLARITIN) 10 MG tablet Take 10 mg by mouth daily.   Multiple Vitamin (MULTIVITAMIN) capsule Take 1 capsule by mouth daily.     Omega-3 Fatty Acids (FISH OIL PO) Take by mouth daily.   No current facility-administered medications on file prior to visit.    Allergies:  Allergies  Allergen Reactions   Amoxicillin Rash   Omnipaque [Iohexol] Shortness Of Breath   Phenytoin Sodium Extended Swelling   Iodinated Contrast Media Itching   Trileptal [Oxcarbazepine] Swelling    Social History:  Social History   Socioeconomic History   Marital status: Married    Spouse name: Not on file   Number of children: Not on file   Years of education: Not on file   Highest education level: Not on file  Occupational History   Not on file  Tobacco Use   Smoking status: Never   Smokeless tobacco: Never  Vaping Use   Vaping status: Never Used  Substance and Sexual Activity   Alcohol use: No   Drug use: No   Sexual activity: Yes    Birth control/protection: None  Other Topics Concern   Not on file  Social History Narrative   Not on file   Social Determinants of Health   Financial Resource Strain: Not on file  Food Insecurity: Not on file  Transportation  Needs: Not on file  Physical Activity: Not on file  Stress: Not on file  Social Connections: Not on file  Intimate Partner Violence: Not on file   Social History   Tobacco Use  Smoking Status Never  Smokeless Tobacco Never   Social History   Substance and Sexual Activity  Alcohol Use No    Family History:  Family History  Problem Relation Age of Onset   Hyperlipidemia Mother    Hypertension Mother    Arthritis Mother    Cancer Mother  ovarian cancer   Cancer Father    Hypertension Brother    Breast cancer Paternal Aunt 66   Breast cancer Paternal Aunt 20   Stroke Maternal Grandfather    Hypertension Maternal Grandfather    Lymphoma Paternal Grandmother     Past medical history, surgical history, medications, allergies, family history and social history reviewed with patient today and changes made to appropriate areas of the chart.   Review of Systems  Constitutional: Negative.   HENT: Negative.    Eyes: Negative.   Respiratory: Negative.    Cardiovascular: Negative.   Gastrointestinal:  Positive for heartburn. Negative for abdominal pain, blood in stool, constipation, diarrhea, melena, nausea and vomiting.  Genitourinary: Negative.   Musculoskeletal: Negative.   Skin: Negative.   Neurological: Negative.   Endo/Heme/Allergies: Negative.   Psychiatric/Behavioral: Negative.     All other ROS negative except what is listed above and in the HPI.      Objective:    BP (!) 145/83   Pulse 88   Temp 98 F (36.7 C) (Oral)   Ht 5\' 5"  (1.651 m)   Wt 174 lb 6.4 oz (79.1 kg)   SpO2 98%   BMI 29.02 kg/m   Wt Readings from Last 3 Encounters:  03/03/23 174 lb 6.4 oz (79.1 kg)  08/15/22 178 lb (80.7 kg)  02/14/22 174 lb 12.8 oz (79.3 kg)    Physical Exam Vitals and nursing note reviewed. Exam conducted with a chaperone present.  Constitutional:      General: She is not in acute distress.    Appearance: Normal appearance. She is not ill-appearing,  toxic-appearing or diaphoretic.  HENT:     Head: Normocephalic and atraumatic.     Right Ear: Tympanic membrane, ear canal and external ear normal. There is no impacted cerumen.     Left Ear: Tympanic membrane, ear canal and external ear normal. There is no impacted cerumen.     Nose: Nose normal. No congestion or rhinorrhea.     Mouth/Throat:     Mouth: Mucous membranes are moist.     Pharynx: Oropharynx is clear. No oropharyngeal exudate or posterior oropharyngeal erythema.  Eyes:     General: No scleral icterus.       Right eye: No discharge.        Left eye: No discharge.     Extraocular Movements: Extraocular movements intact.     Conjunctiva/sclera: Conjunctivae normal.     Pupils: Pupils are equal, round, and reactive to light.  Neck:     Vascular: No carotid bruit.  Cardiovascular:     Rate and Rhythm: Normal rate and regular rhythm.     Pulses: Normal pulses.     Heart sounds: No murmur heard.    No friction rub. No gallop.  Pulmonary:     Effort: Pulmonary effort is normal. No respiratory distress.     Breath sounds: Normal breath sounds. No stridor. No wheezing, rhonchi or rales.  Chest:     Chest wall: No tenderness.  Abdominal:     General: Abdomen is flat. Bowel sounds are normal. There is no distension.     Palpations: Abdomen is soft. There is no mass.     Tenderness: There is no abdominal tenderness. There is no right CVA tenderness, left CVA tenderness, guarding or rebound.     Hernia: No hernia is present. There is no hernia in the left inguinal area or right inguinal area.  Genitourinary:    Labia:  Right: No rash, tenderness, lesion or injury.        Left: No rash, tenderness, lesion or injury.      Vagina: Normal.     Cervix: Normal.     Uterus: Normal.      Adnexa: Right adnexa normal and left adnexa normal.  Musculoskeletal:        General: No swelling, tenderness, deformity or signs of injury.     Cervical back: Normal range of motion and neck  supple. No rigidity. No muscular tenderness.     Right lower leg: No edema.     Left lower leg: No edema.  Lymphadenopathy:     Cervical: No cervical adenopathy.  Skin:    General: Skin is warm and dry.     Capillary Refill: Capillary refill takes less than 2 seconds.     Coloration: Skin is not jaundiced or pale.     Findings: No bruising, erythema, lesion or rash.  Neurological:     General: No focal deficit present.     Mental Status: She is alert and oriented to person, place, and time. Mental status is at baseline.     Cranial Nerves: No cranial nerve deficit.     Sensory: No sensory deficit.     Motor: No weakness.     Coordination: Coordination normal.     Gait: Gait normal.     Deep Tendon Reflexes: Reflexes normal.  Psychiatric:        Mood and Affect: Mood normal.        Behavior: Behavior normal.        Thought Content: Thought content normal.        Judgment: Judgment normal.     Results for orders placed or performed in visit on 08/15/22  CBC With Diff/Platelet  Result Value Ref Range   WBC 7.4 3.4 - 10.8 x10E3/uL   RBC 4.83 3.77 - 5.28 x10E6/uL   Hemoglobin 14.3 11.1 - 15.9 g/dL   Hematocrit 02.7 25.3 - 46.6 %   MCV 88 79 - 97 fL   MCH 29.6 26.6 - 33.0 pg   MCHC 33.6 31.5 - 35.7 g/dL   RDW 66.4 40.3 - 47.4 %   Platelets 251 150 - 450 x10E3/uL   Neutrophils 53 Not Estab. %   Lymphs 36 Not Estab. %   Monocytes 8 Not Estab. %   Eos 2 Not Estab. %   Basos 0 Not Estab. %   Neutrophils Absolute 3.9 1.4 - 7.0 x10E3/uL   Lymphocytes Absolute 2.7 0.7 - 3.1 x10E3/uL   Monocytes Absolute 0.6 0.1 - 0.9 x10E3/uL   EOS (ABSOLUTE) 0.1 0.0 - 0.4 x10E3/uL   Basophils Absolute 0.0 0.0 - 0.2 x10E3/uL   Immature Granulocytes 1 Not Estab. %   Immature Grans (Abs) 0.1 0.0 - 0.1 x10E3/uL  Vitamin B12  Result Value Ref Range   Vitamin B-12 720 232 - 1,245 pg/mL  TSH  Result Value Ref Range   TSH 1.480 0.450 - 4.500 uIU/mL  Basic Metabolic Panel (BMET)  Result Value Ref  Range   Glucose 88 70 - 99 mg/dL   BUN 13 6 - 24 mg/dL   Creatinine, Ser 2.59 0.57 - 1.00 mg/dL   eGFR 563 >87 FI/EPP/2.95   BUN/Creatinine Ratio 20 9 - 23   Sodium 140 134 - 144 mmol/L   Potassium 4.4 3.5 - 5.2 mmol/L   Chloride 100 96 - 106 mmol/L   CO2 25 20 - 29 mmol/L   Calcium  10.1 8.7 - 10.2 mg/dL  Lipid Panel w/o Chol/HDL Ratio  Result Value Ref Range   Cholesterol, Total 245 (H) 100 - 199 mg/dL   Triglycerides 413 0 - 149 mg/dL   HDL 62 >24 mg/dL   VLDL Cholesterol Cal 26 5 - 40 mg/dL   LDL Chol Calc (NIH) 401 (H) 0 - 99 mg/dL      Assessment & Plan:   Problem List Items Addressed This Visit       Cardiovascular and Mediastinum   Hypertension    BP running high- will increase her losartan to 25mg  and recheck in 6-8 weeks. Call with any concerns.       Relevant Medications   losartan (COZAAR) 25 MG tablet   Other Relevant Orders   Urinalysis, Routine w reflex microscopic   Microalbumin, Urine Waived     Endocrine   Hypothyroidism (Chronic)    Rechecking labs today. Await results. Treat as needed.       Relevant Orders   TSH     Other   Anxiety    Under good control on current regimen. Continue current regimen. Continue to monitor. Call with any concerns. Refills given. Labs drawn today.       Relevant Medications   escitalopram (LEXAPRO) 20 MG tablet   Mixed hyperlipidemia    Rechecking labs today. Await results. Treat as needed.       Relevant Medications   losartan (COZAAR) 25 MG tablet   Other Relevant Orders   Lipid Panel w/o Chol/HDL Ratio   Other Visit Diagnoses     Routine general medical examination at a health care facility    -  Primary   Vaccines up to date. Screening labs checked today. Mammogram ordered. Pap done. Colonoscopy up to date. Continue diet and exercise. Call with any concerns.   Relevant Orders   CBC with Differential/Platelet   Comprehensive metabolic panel   Lipid Panel w/o Chol/HDL Ratio   Cytology - PAP    Urinalysis, Routine w reflex microscopic   TSH   Microalbumin, Urine Waived   Encounter for screening mammogram for malignant neoplasm of breast       Mammogram ordered today.   Relevant Orders   MM 3D SCREENING MAMMOGRAM BILATERAL BREAST        Follow up plan: Return in about 6 weeks (around 04/14/2023).   LABORATORY TESTING:  - Pap smear: pap done  IMMUNIZATIONS:   - Tdap: Tetanus vaccination status reviewed: last tetanus booster within 10 years. - Influenza: Refused - Pneumovax: Not applicable - Prevnar: Not applicable - COVID: Refused - HPV: Not applicable - Shingrix vaccine: Refused  SCREENING: -Mammogram: Ordered today  - Colonoscopy: Up to date   PATIENT COUNSELING:   Advised to take 1 mg of folate supplement per day if capable of pregnancy.   Sexuality: Discussed sexually transmitted diseases, partner selection, use of condoms, avoidance of unintended pregnancy  and contraceptive alternatives.   Advised to avoid cigarette smoking.  I discussed with the patient that most people either abstain from alcohol or drink within safe limits (<=14/week and <=4 drinks/occasion for males, <=7/weeks and <= 3 drinks/occasion for females) and that the risk for alcohol disorders and other health effects rises proportionally with the number of drinks per week and how often a drinker exceeds daily limits.  Discussed cessation/primary prevention of drug use and availability of treatment for abuse.   Diet: Encouraged to adjust caloric intake to maintain  or achieve ideal body weight, to reduce  intake of dietary saturated fat and total fat, to limit sodium intake by avoiding high sodium foods and not adding table salt, and to maintain adequate dietary potassium and calcium preferably from fresh fruits, vegetables, and low-fat dairy products.    stressed the importance of regular exercise  Injury prevention: Discussed safety belts, safety helmets, smoke detector, smoking near bedding  or upholstery.   Dental health: Discussed importance of regular tooth brushing, flossing, and dental visits.    NEXT PREVENTATIVE PHYSICAL DUE IN 1 YEAR. Return in about 6 weeks (around 04/14/2023).

## 2023-03-03 NOTE — Patient Instructions (Signed)
Please call to schedule your mammogram and/or bone density: [facility]  Address: [address], [place], [place] [address] Phone: [phone]  [facility] [address] [place],  [place]  [address] Phone: [phone]

## 2023-03-03 NOTE — Assessment & Plan Note (Signed)
Under good control on current regimen. Continue current regimen. Continue to monitor. Call with any concerns. Refills given. Labs drawn today.   

## 2023-03-04 ENCOUNTER — Encounter: Payer: No Typology Code available for payment source | Admitting: Family Medicine

## 2023-03-04 LAB — CBC WITH DIFFERENTIAL/PLATELET
Basophils Absolute: 0 10*3/uL (ref 0.0–0.2)
Basos: 0 %
EOS (ABSOLUTE): 0.1 10*3/uL (ref 0.0–0.4)
Eos: 1 %
Hematocrit: 40.2 % (ref 34.0–46.6)
Hemoglobin: 13.3 g/dL (ref 11.1–15.9)
Immature Grans (Abs): 0 10*3/uL (ref 0.0–0.1)
Immature Granulocytes: 0 %
Lymphocytes Absolute: 2.9 10*3/uL (ref 0.7–3.1)
Lymphs: 37 %
MCH: 30.2 pg (ref 26.6–33.0)
MCHC: 33.1 g/dL (ref 31.5–35.7)
MCV: 91 fL (ref 79–97)
Monocytes Absolute: 0.5 10*3/uL (ref 0.1–0.9)
Monocytes: 7 %
Neutrophils Absolute: 4.3 10*3/uL (ref 1.4–7.0)
Neutrophils: 55 %
Platelets: 237 10*3/uL (ref 150–450)
RBC: 4.41 x10E6/uL (ref 3.77–5.28)
RDW: 12.5 % (ref 11.7–15.4)
WBC: 7.9 10*3/uL (ref 3.4–10.8)

## 2023-03-04 LAB — COMPREHENSIVE METABOLIC PANEL
ALT: 12 IU/L (ref 0–32)
AST: 19 IU/L (ref 0–40)
Albumin: 4.4 g/dL (ref 3.8–4.9)
Alkaline Phosphatase: 110 IU/L (ref 44–121)
BUN/Creatinine Ratio: 31 — ABNORMAL HIGH (ref 9–23)
BUN: 17 mg/dL (ref 6–24)
Bilirubin Total: 0.3 mg/dL (ref 0.0–1.2)
CO2: 26 mmol/L (ref 20–29)
Calcium: 10.2 mg/dL (ref 8.7–10.2)
Chloride: 103 mmol/L (ref 96–106)
Creatinine, Ser: 0.55 mg/dL — ABNORMAL LOW (ref 0.57–1.00)
Globulin, Total: 2.3 g/dL (ref 1.5–4.5)
Glucose: 90 mg/dL (ref 70–99)
Potassium: 4.6 mmol/L (ref 3.5–5.2)
Sodium: 140 mmol/L (ref 134–144)
Total Protein: 6.7 g/dL (ref 6.0–8.5)
eGFR: 106 mL/min/{1.73_m2} (ref >59)

## 2023-03-04 LAB — LIPID PANEL W/O CHOL/HDL RATIO
Cholesterol, Total: 226 mg/dL — ABNORMAL HIGH (ref 100–199)
HDL: 55 mg/dL (ref >39)
LDL Chol Calc (NIH): 140 mg/dL — ABNORMAL HIGH (ref 0–99)
Triglycerides: 175 mg/dL — ABNORMAL HIGH (ref 0–149)
VLDL Cholesterol Cal: 31 mg/dL (ref 5–40)

## 2023-03-04 LAB — TSH: TSH: 1.24 u[IU]/mL (ref 0.450–4.500)

## 2023-03-07 ENCOUNTER — Other Ambulatory Visit: Payer: Self-pay | Admitting: Family Medicine

## 2023-03-07 LAB — CYTOLOGY - PAP
Comment: NEGATIVE
Comment: NEGATIVE
Comment: NEGATIVE
Diagnosis: NEGATIVE
HPV 16: NEGATIVE
HPV 18 / 45: NEGATIVE
High risk HPV: POSITIVE — AB

## 2023-03-10 NOTE — Telephone Encounter (Signed)
Last RF 08/18/22 #90 3 RF   Requested Prescriptions  Refused Prescriptions Disp Refills   levothyroxine (SYNTHROID) 25 MCG tablet [Pharmacy Med Name: LEVOTHYROXINE 25 MCG TABLET] 90 tablet 1    Sig: TAKE 1 TABLET BY MOUTH EVERY DAY     Endocrinology:  Hypothyroid Agents Passed - 03/07/2023  3:56 PM      Passed - TSH in normal range and within 360 days    TSH  Date Value Ref Range Status  03/03/2023 1.240 0.450 - 4.500 uIU/mL Final         Passed - Valid encounter within last 12 months    Recent Outpatient Visits           1 week ago Routine general medical examination at a health care facility   [facility] [place], Megan P, DO   6 months ago Primary hypertension   [facility] [place], [place] P, DO   1 year ago Routine general medical examination at a health care facility   [facility] [place], [place] P, DO   1 year ago Primary hypertension   [facility] [place], [place] P, DO   2 years ago Routine general medical examination at a health care facility   [facility] Dorcas Carrow, DO       Future Appointments             In 1 month Laural Benes, Oralia Rud, DO [facility]

## 2023-04-12 ENCOUNTER — Other Ambulatory Visit: Payer: Self-pay | Admitting: Family Medicine

## 2023-04-14 ENCOUNTER — Ambulatory Visit: Payer: 59 | Admitting: Family Medicine

## 2023-04-14 NOTE — Telephone Encounter (Signed)
Requested by interface surescripts. Future visit in 1  week. Requested Prescriptions  Pending Prescriptions Disp Refills   losartan (COZAAR) 25 MG tablet [Pharmacy Med Name: LOSARTAN POTASSIUM 25 MG TAB] 45 tablet 0    Sig: TAKE 1/2 TABLET BY MOUTH DAILY     Cardiovascular:  Angiotensin Receptor Blockers Failed - 04/12/2023  9:20 AM      Failed - Cr in normal range and within 180 days    Creat  Date Value Ref Range Status  02/23/2013 0.53 0.50 - 1.10 mg/dL Final   Creatinine, Ser  Date Value Ref Range Status  03/03/2023 0.55 (L) 0.57 - 1.00 mg/dL Final         Failed - Last BP in normal range    BP Readings from Last 1 Encounters:  03/03/23 (!) 145/83         Passed - K in normal range and within 180 days    Potassium  Date Value Ref Range Status  03/03/2023 4.6 3.5 - 5.2 mmol/L Final         Passed - Patient is not pregnant      Passed - Valid encounter within last 6 months    Recent Outpatient Visits           1 month ago Routine general medical examination at a health care facility   [facility] [place], Megan P, DO   8 months ago Primary hypertension   [facility] [place], [place] P, DO   1 year ago Routine general medical examination at a health care facility   [facility] [place], [place] P, DO   1 year ago Primary hypertension   [place] [facility] [place], [place] P, DO   2 years ago Routine general medical examination at a health care facility   [facility] Dorcas Carrow, DO       Future Appointments             In 1 week Dorcas Carrow, DO [place] [facility]

## 2023-04-21 ENCOUNTER — Ambulatory Visit: Payer: 59 | Admitting: Family Medicine

## 2023-04-21 ENCOUNTER — Encounter: Payer: Self-pay | Admitting: Family Medicine

## 2023-04-21 VITALS — BP 130/81 | HR 79 | Ht 65.0 in | Wt 174.0 lb

## 2023-04-21 DIAGNOSIS — I10 Essential (primary) hypertension: Secondary | ICD-10-CM

## 2023-04-21 MED ORDER — LOSARTAN POTASSIUM 25 MG PO TABS: 25.0000 mg | ORAL_TABLET | Freq: Every day | ORAL | 1 refills | Status: DC

## 2023-04-21 NOTE — Assessment & Plan Note (Signed)
Under good control on current regimen. Continue current regimen. Continue to monitor. Call with any concerns. Refills given. Labs drawn today.   

## 2023-04-21 NOTE — Progress Notes (Signed)
BP 130/81   Pulse 79   Ht 5\' 5"  (1.651 m)   Wt 174 lb (78.9 kg)   SpO2 98%   BMI 28.96 kg/m    Subjective:    Patient ID: Nichole Gordon, female    DOB: Apr 06, 1966, 57 y.o.   MRN: [id number]  HPI: Nichole Gordon is a 57 y.o. female  Chief Complaint  Patient presents with   Hypertension   HYPERTENSION  Hypertension status: controlled  Satisfied with current treatment? yes Duration of hypertension: chronic BP monitoring frequency:  not checking BP medication side effects:  no Medication compliance: excellent compliance Previous BP meds:losartan Aspirin: no Recurrent headaches: no Visual changes: no Palpitations: no Dyspnea: no Chest pain: no Lower extremity edema: no Dizzy/lightheaded: no  Relevant past medical, surgical, family and social history reviewed and updated as indicated. Interim medical history since our last visit reviewed. Allergies and medications reviewed and updated.  Review of Systems  Constitutional: Negative.   Respiratory: Negative.    Cardiovascular: Negative.   Gastrointestinal: Negative.   Neurological: Negative.   Psychiatric/Behavioral: Negative.      Per HPI unless specifically indicated above     Objective:    BP 130/81   Pulse 79   Ht 5\' 5"  (1.651 m)   Wt 174 lb (78.9 kg)   SpO2 98%   BMI 28.96 kg/m   Wt Readings from Last 3 Encounters:  04/21/23 174 lb (78.9 kg)  03/03/23 174 lb 6.4 oz (79.1 kg)  08/15/22 178 lb (80.7 kg)    Physical Exam Vitals and nursing note reviewed.  Constitutional:      General: She is not in acute distress.    Appearance: Normal appearance. She is not ill-appearing, toxic-appearing or diaphoretic.  HENT:     Head: Normocephalic and atraumatic.     Right Ear: External ear normal.     Left Ear: External ear normal.     Nose: Nose normal.     Mouth/Throat:     Mouth: Mucous membranes are moist.     Pharynx: Oropharynx is clear.  Eyes:     General: No scleral icterus.       Right eye: No  discharge.        Left eye: No discharge.     Extraocular Movements: Extraocular movements intact.     Conjunctiva/sclera: Conjunctivae normal.     Pupils: Pupils are equal, round, and reactive to light.  Cardiovascular:     Rate and Rhythm: Normal rate and regular rhythm.     Pulses: Normal pulses.     Heart sounds: Normal heart sounds. No murmur heard.    No friction rub. No gallop.  Pulmonary:     Effort: Pulmonary effort is normal. No respiratory distress.     Breath sounds: Normal breath sounds. No stridor. No wheezing, rhonchi or rales.  Chest:     Chest wall: No tenderness.  Musculoskeletal:        General: Normal range of motion.     Cervical back: Normal range of motion and neck supple.  Skin:    General: Skin is warm and dry.     Capillary Refill: Capillary refill takes less than 2 seconds.     Coloration: Skin is not jaundiced or pale.     Findings: No bruising, erythema, lesion or rash.  Neurological:     General: No focal deficit present.     Mental Status: She is alert and oriented to person, place, and time. Mental  status is at baseline.  Psychiatric:        Mood and Affect: Mood normal.        Behavior: Behavior normal.        Thought Content: Thought content normal.        Judgment: Judgment normal.     Results for orders placed or performed in visit on 03/03/23  Microscopic Examination   Urine  Result Value Ref Range   WBC, UA 0-5 0 - 5 /hpf   RBC, Urine 0-2 0 - 2 /hpf   Epithelial Cells (non renal) 0-10 0 - 10 /hpf   Mucus, UA Present (A) Not Estab.   Bacteria, UA None seen None seen/Few  CBC with Differential/Platelet  Result Value Ref Range   WBC 7.9 3.4 - 10.8 x10E3/uL   RBC 4.41 3.77 - 5.28 x10E6/uL   Hemoglobin 13.3 11.1 - 15.9 g/dL   Hematocrit 62.6 94.8 - 46.6 %   MCV 91 79 - 97 fL   MCH 30.2 26.6 - 33.0 pg   MCHC 33.1 31.5 - 35.7 g/dL   RDW 54.6 27.0 - 35.0 %   Platelets 237 150 - 450 x10E3/uL   Neutrophils 55 Not Estab. %   Lymphs 37  Not Estab. %   Monocytes 7 Not Estab. %   Eos 1 Not Estab. %   Basos 0 Not Estab. %   Neutrophils Absolute 4.3 1.4 - 7.0 x10E3/uL   Lymphocytes Absolute 2.9 0.7 - 3.1 x10E3/uL   Monocytes Absolute 0.5 0.1 - 0.9 x10E3/uL   EOS (ABSOLUTE) 0.1 0.0 - 0.4 x10E3/uL   Basophils Absolute 0.0 0.0 - 0.2 x10E3/uL   Immature Granulocytes 0 Not Estab. %   Immature Grans (Abs) 0.0 0.0 - 0.1 x10E3/uL  Comprehensive metabolic panel  Result Value Ref Range   Glucose 90 70 - 99 mg/dL   BUN 17 6 - 24 mg/dL   Creatinine, Ser 0.93 (L) 0.57 - 1.00 mg/dL   eGFR 818 >29 HB/ZJI/9.67   BUN/Creatinine Ratio 31 (H) 9 - 23   Sodium 140 134 - 144 mmol/L   Potassium 4.6 3.5 - 5.2 mmol/L   Chloride 103 96 - 106 mmol/L   CO2 26 20 - 29 mmol/L   Calcium 10.2 8.7 - 10.2 mg/dL   Total Protein 6.7 6.0 - 8.5 g/dL   Albumin 4.4 3.8 - 4.9 g/dL   Globulin, Total 2.3 1.5 - 4.5 g/dL   Bilirubin Total 0.3 0.0 - 1.2 mg/dL   Alkaline Phosphatase 110 44 - 121 IU/L   AST 19 0 - 40 IU/L   ALT 12 0 - 32 IU/L  Lipid Panel w/o Chol/HDL Ratio  Result Value Ref Range   Cholesterol, Total 226 (H) 100 - 199 mg/dL   Triglycerides 893 (H) 0 - 149 mg/dL   HDL 55 >81 mg/dL   VLDL Cholesterol Cal 31 5 - 40 mg/dL   LDL Chol Calc (NIH) 017 (H) 0 - 99 mg/dL  Urinalysis, Routine w reflex microscopic  Result Value Ref Range   Specific Gravity, UA 1.020 1.005 - 1.030   pH, UA 7.0 5.0 - 7.5   Color, UA Yellow Yellow   Appearance Ur Cloudy (A) Clear   Leukocytes,UA 1+ (A) Negative   Protein,UA Negative Negative/Trace   Glucose, UA Negative Negative   Ketones, UA Negative Negative   RBC, UA Negative Negative   Bilirubin, UA Negative Negative   Urobilinogen, Ur 4.0 (H) 0.2 - 1.0 mg/dL   Nitrite, UA Negative Negative  Microscopic Examination See below:   TSH  Result Value Ref Range   TSH 1.240 0.450 - 4.500 uIU/mL  Microalbumin, Urine Waived  Result Value Ref Range   Microalb, Ur Waived 30 (H) 0 - 19 mg/L   Creatinine, Urine  Waived 100 10 - 300 mg/dL   Microalb/Creat Ratio <30 <30 mg/g  Cytology - PAP  Result Value Ref Range   High risk HPV Positive (A)    HPV 16 Negative    HPV 18 / 45 Negative    Adequacy      Satisfactory for evaluation. The presence or absence of an   Adequacy      endocervical/transformation zone component cannot be determined because   Adequacy of atrophy.    Diagnosis      - Negative for intraepithelial lesion or malignancy (NILM)   Comment Normal Reference Range HPV - Negative    Comment Normal Reference Range HPV 16- Negative    Comment Normal Reference Range HPV 16 18 45 -Negative       Assessment & Plan:   Problem List Items Addressed This Visit       Cardiovascular and Mediastinum   Hypertension - Primary    Under good control on current regimen. Continue current regimen. Continue to monitor. Call with any concerns. Refills given. Labs drawn today.       Relevant Medications   losartan (COZAAR) 25 MG tablet   Other Relevant Orders   Basic metabolic panel     Follow up plan: Return March 6 month follow up.

## 2023-04-22 LAB — BASIC METABOLIC PANEL
BUN/Creatinine Ratio: 17 (ref 9–23)
BUN: 14 mg/dL (ref 6–24)
CO2: 23 mmol/L (ref 20–29)
Calcium: 9.7 mg/dL (ref 8.7–10.2)
Chloride: 101 mmol/L (ref 96–106)
Creatinine, Ser: 0.83 mg/dL (ref 0.57–1.00)
Glucose: 104 mg/dL — ABNORMAL HIGH (ref 70–99)
Potassium: 4.3 mmol/L (ref 3.5–5.2)
Sodium: 142 mmol/L (ref 134–144)
eGFR: 81 mL/min/{1.73_m2} (ref >59)

## 2023-05-06 ENCOUNTER — Encounter: Payer: Self-pay | Admitting: Family Medicine

## 2023-05-13 ENCOUNTER — Ambulatory Visit
Admission: RE | Admit: 2023-05-13 | Discharge: 2023-05-13 | Disposition: A | Discharge status: Home / Self Care | Payer: 59 | Source: Ambulatory Visit | Attending: Family Medicine | Admitting: Family Medicine

## 2023-05-13 DIAGNOSIS — Z1231 Encounter for screening mammogram for malignant neoplasm of breast: Secondary | ICD-10-CM | POA: Insufficient documentation

## 2023-05-14 ENCOUNTER — Other Ambulatory Visit: Payer: Self-pay | Admitting: Family Medicine

## 2023-05-14 DIAGNOSIS — R928 Other abnormal and inconclusive findings on diagnostic imaging of breast: Secondary | ICD-10-CM

## 2023-05-21 ENCOUNTER — Encounter: Payer: Self-pay | Admitting: Radiology

## 2023-05-21 ENCOUNTER — Ambulatory Visit
Admission: RE | Admit: 2023-05-21 | Discharge: 2023-05-21 | Disposition: A | Discharge status: Home / Self Care | Payer: 59 | Source: Ambulatory Visit | Attending: Family Medicine | Admitting: Family Medicine

## 2023-05-21 DIAGNOSIS — R920 Mammographic microcalcification found on diagnostic imaging of breast: Secondary | ICD-10-CM | POA: Diagnosis not present

## 2023-05-21 DIAGNOSIS — R92321 Mammographic fibroglandular density, right breast: Secondary | ICD-10-CM | POA: Diagnosis not present

## 2023-05-21 DIAGNOSIS — R928 Other abnormal and inconclusive findings on diagnostic imaging of breast: Secondary | ICD-10-CM | POA: Insufficient documentation

## 2023-05-22 ENCOUNTER — Other Ambulatory Visit: Payer: Self-pay | Admitting: Family Medicine

## 2023-05-22 DIAGNOSIS — R928 Other abnormal and inconclusive findings on diagnostic imaging of breast: Secondary | ICD-10-CM

## 2023-05-22 DIAGNOSIS — R921 Mammographic calcification found on diagnostic imaging of breast: Secondary | ICD-10-CM

## 2023-05-23 ENCOUNTER — Encounter: Payer: Self-pay | Admitting: Family Medicine

## 2023-05-26 ENCOUNTER — Ambulatory Visit
Admission: RE | Admit: 2023-05-26 | Discharge: 2023-05-26 | Disposition: A | Discharge status: Home / Self Care | Payer: 59 | Source: Ambulatory Visit | Attending: Family Medicine | Admitting: Family Medicine

## 2023-05-26 DIAGNOSIS — R921 Mammographic calcification found on diagnostic imaging of breast: Secondary | ICD-10-CM | POA: Insufficient documentation

## 2023-05-26 DIAGNOSIS — R928 Other abnormal and inconclusive findings on diagnostic imaging of breast: Secondary | ICD-10-CM | POA: Insufficient documentation

## 2023-05-26 DIAGNOSIS — D0511 Intraductal carcinoma in situ of right breast: Secondary | ICD-10-CM | POA: Insufficient documentation

## 2023-05-26 HISTORY — PX: BREAST BIOPSY: SHX20

## 2023-05-26 MED ORDER — LIDOCAINE-EPINEPHRINE 1 %-1:100000 IJ SOLN
20.0000 mL | Freq: Once | INTRAMUSCULAR | Status: AC
Start: 2023-05-26 — End: 2023-05-26
  Administered 2023-05-26: 20 mL
  Filled 2023-05-26: qty 20

## 2023-05-26 MED ORDER — LIDOCAINE 1 % OPTIME INJ - NO CHARGE
5.0000 mL | Freq: Once | INTRAMUSCULAR | Status: AC
Start: 2023-05-26 — End: 2023-05-26
  Administered 2023-05-26: 5 mL
  Filled 2023-05-26: qty 6

## 2023-05-27 LAB — SURGICAL PATHOLOGY

## 2023-05-28 ENCOUNTER — Encounter: Payer: Self-pay | Admitting: *Deleted

## 2023-05-28 DIAGNOSIS — D0511 Intraductal carcinoma in situ of right breast: Secondary | ICD-10-CM

## 2023-05-28 NOTE — Progress Notes (Signed)
Received referral for newly diagnosed breast cancer from [facility].  Navigation initiated. She will see Dr. Smith Robert on Monday 12/16 at 1:30.   She will call me back with her decision on surgeon she would like to see.

## 2023-06-02 ENCOUNTER — Inpatient Hospital Stay: Payer: 59 | Attending: Oncology | Admitting: Oncology

## 2023-06-02 ENCOUNTER — Other Ambulatory Visit: Payer: Self-pay

## 2023-06-02 ENCOUNTER — Inpatient Hospital Stay: Payer: 59

## 2023-06-02 ENCOUNTER — Encounter: Payer: Self-pay | Admitting: *Deleted

## 2023-06-02 ENCOUNTER — Encounter: Payer: Self-pay | Admitting: Oncology

## 2023-06-02 VITALS — BP 153/82 | HR 115 | Temp 99.0°F | Resp 18 | Ht 64.0 in | Wt 172.8 lb

## 2023-06-02 DIAGNOSIS — Z170 Estrogen receptor positive status [ER+]: Secondary | ICD-10-CM | POA: Diagnosis not present

## 2023-06-02 DIAGNOSIS — E782 Mixed hyperlipidemia: Secondary | ICD-10-CM | POA: Insufficient documentation

## 2023-06-02 DIAGNOSIS — D0511 Intraductal carcinoma in situ of right breast: Secondary | ICD-10-CM | POA: Insufficient documentation

## 2023-06-02 DIAGNOSIS — E039 Hypothyroidism, unspecified: Secondary | ICD-10-CM | POA: Diagnosis not present

## 2023-06-02 DIAGNOSIS — I10 Essential (primary) hypertension: Secondary | ICD-10-CM | POA: Diagnosis not present

## 2023-06-02 NOTE — Progress Notes (Signed)
Accompanied patient and family to initial medical oncology appointment.   Reviewed Breast Cancer treatment handbook.   Care plan summary given to patient.   Reviewed outreach programs and cancer center services. She is still considering surgeons and will call me back to let me know which surgeon she would like to see.

## 2023-06-02 NOTE — Progress Notes (Signed)
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 Olevia Perches  Date of visit: 06/02/23   History of presenting illness-patient is a 57 year old female with a past medical history significant for hypertension hypothyroidism and GERD who underwent a screening mammogram in November 2024 which showed possible calcifications in the right breast.  This was followed by diagnostic mammogram which showed 3 mm group of calcifications in the upper outer quadrant of the right breast.  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  Tumor was ER +100% PR +20%.  Patient referred for further management.  Patient had a prior breast biopsy in 2022.  She is G2 P2 and postmenopausal.  Family history significant for ovarian cancer in her mother and breast cancer in 3 of her paternal aunts.  ECOG PS- 0  Pain scale- 0   Review of systems- Review of Systems  Constitutional:  Negative for chills, fever, malaise/fatigue and weight loss.  HENT:  Negative for congestion, ear discharge and nosebleeds.   Eyes:  Negative for blurred vision.  Respiratory:  Negative for cough, hemoptysis, sputum production, shortness of breath and wheezing.   Cardiovascular:  Negative for chest pain, palpitations, orthopnea and claudication.  Gastrointestinal:  Negative for abdominal pain, blood in stool, constipation, diarrhea, heartburn, melena, nausea and vomiting.  Genitourinary:  Negative for dysuria, flank pain, frequency, hematuria and urgency.  Musculoskeletal:  Negative for back pain, joint pain and myalgias.  Skin:  Negative for rash.  Neurological:   Negative for dizziness, tingling, focal weakness, seizures, weakness and headaches.  Endo/Heme/Allergies:  Does not bruise/bleed easily.  Psychiatric/Behavioral:  Negative for depression and suicidal ideas. The patient does not have insomnia.     Allergies  Allergen Reactions   Amoxicillin Rash   Omnipaque [Iohexol] Shortness Of Breath   Phenytoin Sodium Extended Swelling   Iodinated Contrast Media Itching   Trileptal [Oxcarbazepine] Swelling    Patient Active Problem List   Diagnosis Date Noted   Mixed hyperlipidemia 08/15/2022   Polyp of transverse colon    Family history of ovarian cancer 08/26/2017   Hypothyroidism 11/21/2014   Hypertension 12/15/2011   Anxiety 12/15/2011   Fibrocystic breast disease 12/15/2011   Kidney stones 11/09/2010     Past Medical History:  Diagnosis Date   Anxiety    Family history of adverse reaction to anesthesia    Mother - low BP   Fibrocystic breast disease    GERD (gastroesophageal reflux disease)    Hypertension    Hypothyroidism    Mixed hyperlipidemia 08/15/2022   Motion sickness    car back seat    Palpitations    Seizures (HCC)    Wears contact lenses      Past Surgical History:  Procedure Laterality Date   BREAST BIOPSY Right 06/17/2019   stereotactic biopsy, x-clip,  MULTIPLE FRAGMENTS OF CYST WALL FOCAL USUAL DUCTAL HYPERPLASIA, COLUMNAR CELL CHANGE, AND FIBROSIS.   BREAST BIOPSY Right 05/26/2023   rt br stereo, calcs, x clip, path pending.   BREAST BIOPSY Right 05/26/2023   MM RT BREAST BX W LOC DEV 1ST LESION IMAGE BX SPEC STEREO GUIDE 05/26/2023 [facility]   COLONOSCOPY WITH PROPOFOL N/A 01/25/2019  Procedure: COLONOSCOPY WITH PROPOFOL;  Surgeon: Midge Minium, MD;  Location: [facility];  Service: Endoscopy;  Laterality: N/A;   LITHOTRIPSY  2012   POLYPECTOMY  01/25/2019   Procedure: POLYPECTOMY;  Surgeon: Midge Minium, MD;  Location: [facility];  Service: Endoscopy;;    Social History    Socioeconomic History   Marital status: Married    Spouse name: Not on file   Number of children: Not on file   Years of education: Not on file   Highest education level: Not on file  Occupational History   Not on file  Tobacco Use   Smoking status: Never   Smokeless tobacco: Never  Vaping Use   Vaping status: Never Used  Substance and Sexual Activity   Alcohol use: No   Drug use: No   Sexual activity: Yes    Birth control/protection: None  Other Topics Concern   Not on file  Social History Narrative   Not on file   Social Drivers of Health   Financial Resource Strain: Not on file  Food Insecurity: Not on file  Transportation Needs: Not on file  Physical Activity: Not on file  Stress: Not on file  Social Connections: Not on file  Intimate Partner Violence: Not on file     Family History  Problem Relation Age of Onset   Hyperlipidemia Mother    Hypertension Mother    Arthritis Mother    Cancer Mother        ovarian cancer   Cancer Father    Hypertension Brother    Breast cancer Paternal Aunt 64   Breast cancer Paternal Aunt 61   Stroke Maternal Grandfather    Hypertension Maternal Grandfather    Lymphoma Paternal Grandmother      Current Outpatient Medications:    Cholecalciferol (VITAMIN D) 2000 UNITS CAPS, Take by mouth.  , Disp: , Rfl:    escitalopram (LEXAPRO) 20 MG tablet, Take 1 tablet (20 mg total) by mouth daily., Disp: 90 tablet, Rfl: 1   fluticasone (FLONASE) 50 MCG/ACT nasal spray, Place 1 spray into both nostrils 2 (two) times daily. , Disp: , Rfl:    levothyroxine (SYNTHROID) 25 MCG tablet, Take 1 tablet (25 mcg total) by mouth daily., Disp: 90 tablet, Rfl: 3   loratadine (CLARITIN) 10 MG tablet, Take 10 mg by mouth daily., Disp: , Rfl:    losartan (COZAAR) 25 MG tablet, Take 1 tablet (25 mg total) by mouth daily., Disp: 90 tablet, Rfl: 1   Multiple Vitamin (MULTIVITAMIN) capsule, Take 1 capsule by mouth daily.  , Disp: , Rfl:    naproxen  (NAPROSYN) 500 MG tablet, Take 1 tablet (500 mg total) by mouth 2 (two) times daily with a meal., Disp: 180 tablet, Rfl: 1   Omega-3 Fatty Acids (FISH OIL PO), Take by mouth daily., Disp: , Rfl:    omeprazole (PRILOSEC) 20 MG capsule, Take 1 capsule (20 mg total) by mouth daily., Disp: 90 capsule, Rfl: 1   Physical exam:  Vitals:   06/02/23 1335 06/02/23 1339  BP: (!) 144/86 (!) 153/82  Pulse: (!) 115   Resp: 18   Temp: 99 F (37.2 C)   TempSrc: Tympanic   SpO2: 100%   Weight: 172 lb 12.8 oz (78.4 kg)   Height: 5\' 4"  (1.626 m)    Physical Exam HENT:     Head: Normocephalic and atraumatic.  Eyes:     Pupils: Pupils are equal, round, and reactive to light.  Cardiovascular:  Rate and Rhythm: Normal rate and regular rhythm.     Heart sounds: Normal heart sounds.  Pulmonary:     Effort: Pulmonary effort is normal.     Breath sounds: Normal breath sounds.  Abdominal:     General: Bowel sounds are normal.     Palpations: Abdomen is soft.  Musculoskeletal:     Cervical back: Normal range of motion.  Skin:    General: Skin is warm and dry.  Neurological:     Mental Status: She is alert and oriented to person, place, and time.    Breast exam: No palpable bilateral breast masses.  No palpable bilateral axillary adenopathy.      Latest Ref Rng & Units 04/21/2023    1:48 PM  CMP  Glucose 70 - 99 mg/dL 161   BUN 6 - 24 mg/dL 14   Creatinine 0.96 - 1.00 mg/dL 0.45   Sodium 409 - 811 mmol/L 142   Potassium 3.5 - 5.2 mmol/L 4.3   Chloride 96 - 106 mmol/L 101   CO2 20 - 29 mmol/L 23   Calcium 8.7 - 10.2 mg/dL 9.7       Latest Ref Rng & Units 03/03/2023   11:05 AM  CBC  WBC 3.4 - 10.8 x10E3/uL 7.9   Hemoglobin 11.1 - 15.9 g/dL 91.4   Hematocrit 78.2 - 46.6 % 40.2   Platelets 150 - 450 x10E3/uL 237     No images are attached to the encounter.  MM RT BREAST BX W LOC DEV 1ST LESION IMAGE BX SPEC STEREO GUIDE Addendum Date: 05/27/2023 ADDENDUM REPORT: 05/27/2023 10:48  ADDENDUM: PATHOLOGY revealed: 1. Breast, RIGHT, needle core biopsy, upper outer posterior, x clip : - DUCTAL CARCINOMA IN SITU, LOW GRADE, WITH FOCAL CALCIFICATION AND NECROSIS. Pathology results are CONCORDANT with imaging findings, per Dr. Meda Klinefelter. Pathology results and recommendations were discussed with patient via telephone on 05/27/2023. Patient reported biopsy site doing well with no adverse symptoms, and only slight tenderness at the site. Post biopsy care instructions were reviewed, questions were answered and my direct phone number was provided. Patient was instructed to call [facility] for any additional questions or concerns related to biopsy site. RECOMMENDATIONS: 1. Surgical and oncological consultation. Request for surgical and oncological consultation relayed to Irving Shows, RN at [facility] by Donell Sievert, RN on 05/27/2023. Pathology results reported by Donell Sievert, RN on 05/27/2023. Electronically Signed   By: Meda Klinefelter M.D.   On: 05/27/2023 10:48   Result Date: 05/27/2023 CLINICAL DATA:  Indeterminate RIGHT breast calcifications EXAM: RIGHT BREAST STEREOTACTIC CORE NEEDLE BIOPSY COMPARISON:  Previous exam(s). FINDINGS: The patient and I discussed the procedure of stereotactic-guided biopsy including benefits and alternatives. We discussed the high likelihood of a successful procedure. We discussed the risks of the procedure including infection, bleeding, tissue injury, clip migration, and inadequate sampling. Informed written consent was given. The usual time out protocol was performed immediately prior to the procedure. Using sterile technique and 1% lidocaine and 1% lidocaine with epinephrine as local anesthetic, under stereotactic guidance, a 9 gauge vacuum assisted device was used to perform core needle biopsy of calcifications in the upper outer quadrant of the RIGHT breast using a lateral approach. Specimen radiograph was performed showing  representative calcifications. Specimens with calcifications are identified for pathology. Lesion quadrant: Upper outer quadrant At the conclusion of the procedure, an X shaped tissue marker clip was deployed into the biopsy cavity. Follow-up 2-view mammogram was performed and dictated  separately. IMPRESSION: Stereotactic-guided biopsy of indeterminate calcifications. No apparent complications. Electronically Signed: By: Meda Klinefelter M.D. On: 05/26/2023 08:10   MM CLIP PLACEMENT RIGHT Result Date: 05/26/2023 CLINICAL DATA:  Status post stereotactic guided biopsy EXAM: 3D DIAGNOSTIC RIGHT MAMMOGRAM POST STEREOTACTIC BIOPSY COMPARISON:  Previous exam(s). FINDINGS: 3D Mammographic images were obtained following stereotactic guided biopsy of calcifications. The X shaped biopsy marking clip is displaced 1 cm laterally from residual calcifications. IMPRESSION: Mild lateral displacement of the X shaped biopsy marking clip from residual calcifications. Final Assessment: Post Procedure Mammograms for Marker Placement Electronically Signed   By: Meda Klinefelter M.D.   On: 05/26/2023 08:10   MM 3D DIAGNOSTIC MAMMOGRAM UNILATERAL RIGHT BREAST Result Date: 05/21/2023 CLINICAL DATA:  Recall from screening mammogram for calcifications in the right breast. EXAM: DIGITAL DIAGNOSTIC UNILATERAL RIGHT MAMMOGRAM WITH TOMOSYNTHESIS AND CAD TECHNIQUE: Right digital diagnostic mammography and breast tomosynthesis was performed. The images were evaluated with computer-aided detection. COMPARISON:  Previous exam(s). ACR Breast Density Category b: There are scattered areas of fibroglandular density. FINDINGS: Magnification views demonstrate a 3 mm group of amorphous microcalcifications in the upper-outer right breast. These appear new compared to prior exams. IMPRESSION: Grouped microcalcifications in the right breast are suspicious for malignancy. RECOMMENDATION: Recommend stereotactic guided right breast biopsy. I have  discussed the findings and recommendations with the patient. If applicable, a reminder letter will be sent to the patient regarding the next appointment. BI-RADS CATEGORY  4: Suspicious. Electronically Signed   By: Romona Curls M.D.   On: 05/21/2023 15:53   MM 3D SCREENING MAMMOGRAM BILATERAL BREAST Result Date: 05/14/2023 CLINICAL DATA:  Screening. EXAM: DIGITAL SCREENING BILATERAL MAMMOGRAM WITH TOMOSYNTHESIS AND CAD TECHNIQUE: Bilateral screening digital craniocaudal and mediolateral oblique mammograms were obtained. Bilateral screening digital breast tomosynthesis was performed. The images were evaluated with computer-aided detection. COMPARISON:  Previous exam(s). ACR Breast Density Category b: There are scattered areas of fibroglandular density. FINDINGS: In the right breast, calcifications warrant further evaluation with magnified views. In the left breast, no findings suspicious for malignancy. IMPRESSION: Further evaluation is suggested for calcifications in the right breast. RECOMMENDATION: Diagnostic mammogram of the right breast. (Code:FI-R-20M) The patient will be contacted regarding the findings, and additional imaging will be scheduled. BI-RADS CATEGORY  0: Incomplete: Need additional imaging evaluation. Electronically Signed   By: Ted Mcalpine M.D.   On: 05/14/2023 13:48    Assessment and plan- Patient is a 57 y.o. female referred for ER positive right breast DCIS  Discussed differences between DCIS and invasive mammary carcinoma.  Standard of care for DCIS would be upfront lumpectomy.  I will see her after final pathology results are back.  Patient would benefit from adjuvant radiation following surgery.  Given that her DCIS is ER positive there will also be role for endocrine therapy for 5 years.  Discussed differences and risks and benefits of tamoxifen versus aromatase inhibitors.  Discussed risks and benefits of aromatase inhibitors including all but not limited to hot flashes  mood swings fatigue arthralgias and worsening bone health.  Treatment will be given with a curative intent but will start upon completion of radiation treatment.  I will tentatively see her back in 5 weeks time along with radiation oncology.  We will obtain a bone density scan prior.  I will also refer her to genetic counseling  Thank you for this kind referral and the opportunity to participate in the care of this  patient   Visit Diagnosis 1. Ductal carcinoma in situ (DCIS)  of right breast     Dr. Owens Shark, MD, MPH [facility] at [facility] [phone] 06/02/2023

## 2023-06-03 ENCOUNTER — Encounter: Payer: Self-pay | Admitting: *Deleted

## 2023-06-03 NOTE — Progress Notes (Signed)
Nichole Gordon would like to see Dr. Maia Plan.   He will see her on 12/26.  Appt. Details given to her.

## 2023-06-04 ENCOUNTER — Inpatient Hospital Stay: Payer: 59

## 2023-06-04 ENCOUNTER — Encounter: Payer: Self-pay | Admitting: Licensed Clinical Social Worker

## 2023-06-04 ENCOUNTER — Inpatient Hospital Stay: Payer: 59 | Admitting: Licensed Clinical Social Worker

## 2023-06-04 DIAGNOSIS — Z8041 Family history of malignant neoplasm of ovary: Secondary | ICD-10-CM | POA: Diagnosis not present

## 2023-06-04 DIAGNOSIS — Z808 Family history of malignant neoplasm of other organs or systems: Secondary | ICD-10-CM | POA: Diagnosis not present

## 2023-06-04 DIAGNOSIS — Z803 Family history of malignant neoplasm of breast: Secondary | ICD-10-CM

## 2023-06-04 DIAGNOSIS — D0511 Intraductal carcinoma in situ of right breast: Secondary | ICD-10-CM

## 2023-06-04 DIAGNOSIS — Z800 Family history of malignant neoplasm of digestive organs: Secondary | ICD-10-CM

## 2023-06-04 NOTE — Progress Notes (Signed)
REFERRING PROVIDER: Creig Hines, MD [address] [place],  [place] [address]  PRIMARY PROVIDER:  Olevia Perches P, DO  PRIMARY REASON FOR VISIT:  1. Ductal carcinoma in situ (DCIS) of right breast   2. Family history of ovarian cancer   3. Family history of breast cancer   4. Family history of colon cancer   5. Family history of melanoma    I connected with Ms. Winne on 06/04/2023 at 1:00 PM EDT by MyChart video conference and verified that I am speaking with the correct person using two identifiers.    Patient location: home Provider location: [facility]   HISTORY OF PRESENT ILLNESS:   Ms. Kwasnik, a 57 y.o. female, was seen for a [place] cancer genetics consultation at the request of Dr. Smith Robert due to a personal and family history of cancer.  Ms. Cavanna presents to clinic today to discuss the possibility of a hereditary predisposition to cancer, genetic testing, and to further clarify her future cancer risks, as well as potential cancer risks for family members.   CANCER HISTORY:  In 2024, at the age of 55, Ms. Malinowski was diagnosed with DCIS of the right breast. She met with Dr. Smith Robert on 12/16, treatment plan includes lumpectomy (pt meets with Dr. Maia Plan next week), adjuvant radiation and endocrine therapy.  RISK FACTORS:  Menarche was at age 70.  First live birth at age 76.  Ovaries intact: yes.  Hysterectomy: no.  Menopausal status: postmenopausal.  HRT use: 0 years. Colonoscopy: yes; normal - every 5 years, few polyps first time. Mammogram within the last year: yes. Number of breast biopsies: 2.  Past Medical History:  Diagnosis Date   Anxiety    Family history of adverse reaction to anesthesia    Mother - low BP   Fibrocystic breast disease    GERD (gastroesophageal reflux disease)    Hypertension    Hypothyroidism    Mixed hyperlipidemia 08/15/2022   Motion sickness    car back seat    Palpitations    Seizures (HCC)    Wears contact lenses      Past Surgical History:  Procedure Laterality Date   BREAST BIOPSY Right 06/17/2019   stereotactic biopsy, x-clip,  MULTIPLE FRAGMENTS OF CYST WALL FOCAL USUAL DUCTAL HYPERPLASIA, COLUMNAR CELL CHANGE, AND FIBROSIS.   BREAST BIOPSY Right 05/26/2023   rt br stereo, calcs, x clip, path pending.   BREAST BIOPSY Right 05/26/2023   MM RT BREAST BX W LOC DEV 1ST LESION IMAGE BX SPEC STEREO GUIDE 05/26/2023 [facility]   COLONOSCOPY WITH PROPOFOL N/A 01/25/2019   Procedure: COLONOSCOPY WITH PROPOFOL;  Surgeon: Midge Minium, MD;  Location: [facility];  Service: Endoscopy;  Laterality: N/A;   LITHOTRIPSY  2012   POLYPECTOMY  01/25/2019   Procedure: POLYPECTOMY;  Surgeon: Midge Minium, MD;  Location: [facility];  Service: Endoscopy;;    FAMILY HISTORY:  We obtained a detailed, 4-generation family history.  Significant diagnoses are listed below: Family History  Problem Relation Age of Onset   Hyperlipidemia Mother    Hypertension Mother    Arthritis Mother    Ovarian cancer Mother 77   Colon cancer Father 48   Melanoma Sister    Hypertension Brother    Melanoma Brother    Breast cancer Paternal Aunt 15   Breast cancer Paternal Aunt 52   Breast cancer Paternal Aunt 46   Lung cancer Paternal Uncle    Stroke Maternal Grandfather  Hypertension Maternal Grandfather    Lymphoma Paternal Grandmother 17   Breast cancer Other 32   Ms. Dimarzio has 2 sons, 30 and 81, and 1 daughter, 109. She has 1 brother and 1 sister, both have had melanoma.   Ms. Espeland mother had ovarian cancer at 73 and passed at 1, no known genetic testing. No other known cancers on this side of the family.  Ms. Arcangel father had colon cancer at 29 and passed at 61. 3 paternal aunts had breast cancer in their 45s. A paternal cousin also had breast cancer, she was diagnosed at 88. A paternal uncle had lung cancer. Paternal grandmother had lymphoma at 65 and passed at 74.   Ms. Lighter is unaware of  previous family history of genetic testing for hereditary cancer risks. There is no reported Ashkenazi Jewish ancestry. There is no known consanguinity.    GENETIC COUNSELING ASSESSMENT: Ms. Lockheart is a 57 y.o. female with a personal and family history of cancer which is somewhat suggestive of a hereditary cancer syndrome and predisposition to cancer. We, therefore, discussed and recommended the following at today's visit.   DISCUSSION: We discussed that approximately 10% of breast cancer is hereditary. Most cases of hereditary breast cancer are associated with BRCA1/2 genes, although there are other genes associated with hereditary cancer as well. Cancers and risks are gene specific. We discussed that testing is beneficial for several reasons including knowing about cancer risks, identifying potential screening and risk-reduction options that may be appropriate, and to understand if other family members could be at risk for cancer and allow them to undergo genetic testing.   We reviewed the characteristics, features and inheritance patterns of hereditary cancer syndromes. We also discussed genetic testing, including the appropriate family members to test, the process of testing, insurance coverage and turn-around-time for results. We discussed the implications of a negative, positive and/or variant of uncertain significant result. We recommended Ms. Minyard pursue genetic testing for the [facility] BRCAPlus+ CancerNext+RNA  gene panel.   Based on Ms. Ballweg's personal and family history of cancer, she meets medical criteria for genetic testing. Despite that she meets criteria, she may still have an out of pocket cost. \  PLAN: After considering the risks, benefits, and limitations, Ms. Kedrowski provided informed consent to pursue genetic testing and the blood sample was sent to [facility] for analysis of the BRCAPlus+CancerNext+RNA panel. Results should be available within approximately 1 weeks' time, at  which point they will be disclosed by telephone to Ms. Nilsson, as will any additional recommendations warranted by these results. Ms. Etheridge will receive a summary of her genetic counseling visit and a copy of her results once available. This information will also be available in Epic.   Ms. Heitner questions were answered to her satisfaction today. Our contact information was provided should additional questions or concerns arise. Thank you for the referral and allowing [place] to share in the care of your patient.   Lacy Duverney, MS, [facility] Genetic Counselor [email or web address] Phone: [phone]  The patient was seen for a total of 16 minutes in telephone genetic counseling.  Dr. Blake Divine was available for discussion regarding this case.   _______________________________________________________________________ For Office Staff:  Number of people involved in session: 1 Was an Intern/ student involved with case: no

## 2023-06-09 ENCOUNTER — Other Ambulatory Visit: Payer: 59

## 2023-06-12 ENCOUNTER — Other Ambulatory Visit: Payer: Self-pay | Admitting: General Surgery

## 2023-06-12 ENCOUNTER — Ambulatory Visit: Payer: Self-pay | Admitting: General Surgery

## 2023-06-12 DIAGNOSIS — D0511 Intraductal carcinoma in situ of right breast: Secondary | ICD-10-CM | POA: Diagnosis not present

## 2023-06-12 NOTE — H&P (Signed)
HISTORY OF PRESENT ILLNESS (HPI)   The patient is a 57 year old evaluated for right breast ductal carcinoma in situ (DCIS) discovered after a screening mammogram revealed an area of calcifications. Follow-up diagnostic mammography showed a 3-millimeter group of amorphous microcalcifications in the upper outer quadrant of the right breast. Stereotactic core biopsy confirmed low-grade DCIS. The patient was evaluated by medical oncology, who recommended upfront partial mastectomy. No significant past cervical history noted.   PERTINENT POSITIVES   - 3-millimeter group of amorphous microcalcifications in the right breast - Diagnosis of ductal carcinoma in situ - Low-grade DCIS   PERTINENT NEGATIVES   - No history of hormone therapies - No previous radiation therapy - No masses or nipple discharges noted - Negative physical exam except for breast findings   REVIEW OF SYSTEMS   - Constitutional - Negative for fever, chills - HENT - Negative for ear pain, sore throat - Eyes - Negative for pain and visual disturbance. - Respiratory - Negative for cough, chest tightness and shortness of breath. - Cardiovascular - Negative for chest pain, palpitations nor leg swelling. - Gastrointestinal - Negative for nausea, vomiting,  abdominal pain nor diarrhea. - Endocrine - Negative for polydipsia nor polyuria. - Genitourinary - Negative for dysuria nor hematuria. - Musculoskeletal - Negative for back pain, neck pain, nor extremity pain. - Skin - Negative for pallor, rash. - Neurological - Negative for dizziness, seizures, focal weakness and headaches. - Psychiatric/Behavioral - Negative for behavioral problems, confusion and agitation.  No suicidal ideation, no homicidal ideation, no hallucinations, no delusions.   PHYSICAL EXAM   - Abdomen - Bowel sounds normal, no tenderness, organomegaly, masses, or hernia. - Breast - No masses or significant changes except biopsy area - Extremities -  No  amputations or deformities, cyanosis, edema or varicosities, peripheral pulses intact. - Eyes - Conjunctiva clear, sclera non-icteric, EOM intact, no exudates or hemorrhages - General - Well appearing, well nourished, in no distress. Oriented x 3, normal mood and affect. Ambulating without difficulty. - Head - Normocephalic, atraumatic, no visible or palpable masses, depressions, or scaring. - Heart - No cardiomegaly; regular rate and rhythm, no murmur or gallop. - Lungs - Clear to auscultation and percussion. - Mouth - Mucous membranes moist, no mucosal lesions. - Musculoskeletal - Normal gait and station. No misalignment, asymmetry, crepitation, defects, tenderness, masses, effusions, decreased range of motion, instability, atrophy or abnormal strength or tone in the head, neck, spine, ribs, pelvis or extremities. - Neck - Supple, without lesions, bruits, or adenopathy, thyroid non-enlarged and non-tender. - Neurologic - Sensation to pain, touch, and proprioception normal. - Psychiatric - Oriented X3, intact recent and remote memory, judgment and insight, normal mood and affect. - Rectal - Normal sphincter tone, no hemorrhoids or masses palpable. - Skin - Good turgor, no rash, unusual bruising or prominent lesions.   MEDICAL HISTORY   - Hypertension - Hypothyroidism   FAMILY HISTORY   - Breast cancer - Three paternal aunts had breast cancer - Ovarian cancer - Mother had ovarian cancer - Colon cancer - Father had colon cancer   MEDICATIONS   - Lexapro - 10 mg daily - Synthroid - 25 microgram daily - Losartan - 25 mg daily   ASSESSMENT AND PLAN   - Ductal carcinoma in situ (DCIS) - Recommending partial mastectomy with removal of the 3-mm calcified area.  Radiofrequency tag planned for surgical guidance. Pathological evaluation post-surgery to ensure clear margins. Discussed risks including infection, bleeding, and chronic pain. Scheduled follow-up in two  weeks post-surgery.    Patient evaluated by medical oncology.  Plan is to proceed with adjuvant radiation therapy.  Possible adjuvant endocrine therapy after final pathology   Carolan Shiver, MD, [facility] General Surgeon

## 2023-06-13 ENCOUNTER — Encounter: Payer: Self-pay | Admitting: *Deleted

## 2023-06-13 DIAGNOSIS — D0511 Intraductal carcinoma in situ of right breast: Secondary | ICD-10-CM

## 2023-06-13 NOTE — Progress Notes (Signed)
Lumpectomy is scheduled for 1/6.   She will see Dr. Smith Robert and Dr. Rushie Chestnut on 1/21.   Appt details given to her.

## 2023-06-17 ENCOUNTER — Ambulatory Visit
Admission: RE | Admit: 2023-06-17 | Discharge: 2023-06-17 | Disposition: A | Discharge status: Home / Self Care | Payer: 59 | Source: Ambulatory Visit | Attending: General Surgery | Admitting: General Surgery

## 2023-06-17 DIAGNOSIS — D0511 Intraductal carcinoma in situ of right breast: Secondary | ICD-10-CM | POA: Insufficient documentation

## 2023-06-17 HISTORY — PX: BREAST BIOPSY: SHX20

## 2023-06-17 MED ORDER — LIDOCAINE HCL 1 % IJ SOLN
10.0000 mL | Freq: Once | INTRAMUSCULAR | Status: AC
  Administered 2023-06-17: 10 mL
  Filled 2023-06-17: qty 10

## 2023-06-19 ENCOUNTER — Telehealth: Payer: Self-pay | Admitting: Licensed Clinical Social Worker

## 2023-06-19 NOTE — Telephone Encounter (Signed)
 I contacted Ms. Recinos to discuss her genetic testing results. No pathogenic variants were identified in the 13 genes analyzed. The remainder of the 39 gene panel is pending. We will call Ms. Seabrooks when the remainder is back. Detailed clinic note to follow.   The test report has been scanned into EPIC and is located under the Molecular Pathology section of the Results Review tab.  A portion of the result report is included below for reference.      Dena Cary, MS, [facility] Genetic Counselor [email or web address] Phone: [phone]

## 2023-06-20 ENCOUNTER — Other Ambulatory Visit: Payer: Self-pay

## 2023-06-20 ENCOUNTER — Encounter
Admission: RE | Admit: 2023-06-20 | Discharge: 2023-06-20 | Disposition: A | Discharge status: Home / Self Care | Payer: 59 | Source: Ambulatory Visit | Attending: General Surgery | Admitting: General Surgery

## 2023-06-20 ENCOUNTER — Encounter
Admission: RE | Admit: 2023-06-20 | Discharge: 2023-06-20 | Disposition: A | Discharge status: Home / Self Care | Payer: 59 | Source: Ambulatory Visit | Attending: General Surgery

## 2023-06-20 VITALS — Ht 63.5 in | Wt 174.0 lb

## 2023-06-20 DIAGNOSIS — Z01810 Encounter for preprocedural cardiovascular examination: Secondary | ICD-10-CM

## 2023-06-20 DIAGNOSIS — I10 Essential (primary) hypertension: Secondary | ICD-10-CM

## 2023-06-20 HISTORY — DX: Cardiac murmur, unspecified: R01.1

## 2023-06-20 NOTE — Patient Instructions (Addendum)
 Your procedure is scheduled on: Monday 06/23/23 Report to the Registration Desk on the 1st floor of the [facility]. To find out your arrival time, please call [phone] between 1PM - 3PM on: Friday 06/20/23  If your arrival time is 6:00 am, do not arrive before that time as the Medical Mall entrance doors do not open until 6:00 am.  REMEMBER: Instructions that are not followed completely may result in serious medical risk, up to and including death; or upon the discretion of your surgeon and anesthesiologist your surgery may need to be rescheduled.  Do not eat food or drink fluids after midnight the night before surgery.  No gum chewing or hard candies.  One week prior to surgery: Stop Anti-inflammatories (NSAIDS) such as Advil, Aleve , Ibuprofen, Motrin, Naproxen , Naprosyn  and Aspirin based products such as Excedrin, Goody's Powder, BC Powder. Stop ANY OVER THE COUNTER supplements until after surgery. Cholecalciferol (VITAMIN D )  b complex vitamins Multiple Vitamin (MULTIVITAMIN)  Omega-3 Fatty Acids (FISH OIL PO)   You may however, continue to take Tylenol  if needed for pain up until the day of surgery.  Continue taking all of your other prescription medications up until the day of surgery.  ON THE DAY OF SURGERY ONLY TAKE THESE MEDICATIONS WITH SIPS OF WATER :  escitalopram  (LEXAPRO ) 20 MG  levothyroxine  (SYNTHROID ) 25 MCG  omeprazole  (PRILOSEC) 20 MG    No Alcohol for 24 hours before or after surgery.  No Smoking including e-cigarettes for 24 hours before surgery.  No chewable tobacco products for at least 6 hours before surgery.  No nicotine patches on the day of surgery.  Do not use any recreational drugs for at least a week (preferably 2 weeks) before your surgery.  Please be advised that the combination of cocaine and anesthesia may have negative outcomes, up to and including death. If you test positive for cocaine, your surgery will be cancelled.  On the morning of  surgery brush your teeth with toothpaste and water , you may rinse your mouth with mouthwash if you wish. Do not swallow any toothpaste or mouthwash.  Use CHG Soap or wipes as directed on instruction sheet.  Do not wear jewelry, make-up, hairpins, clips or nail polish.  For welded (permanent) jewelry: bracelets, anklets, waist bands, etc.  Please have this removed prior to surgery.  If it is not removed, there is a chance that hospital personnel will need to cut it off on the day of surgery.  Do not wear lotions, powders, or perfumes.   Do not shave body hair from the neck down 48 hours before surgery.  Contact lenses, hearing aids and dentures may not be worn into surgery.  Do not bring valuables to the hospital. [facility] is not responsible for any missing/lost belongings or valuables.   Notify your doctor if there is any change in your medical condition (cold, fever, infection).  Wear comfortable clothing (specific to your surgery type) to the hospital.  After surgery, you can help prevent lung complications by doing breathing exercises.  Take deep breaths and cough every 1-2 hours. Your doctor may order a device called an Incentive Spirometer to help you take deep breaths. When coughing or sneezing, hold a pillow firmly against your incision with both hands. This is called "splinting." Doing this helps protect your incision. It also decreases belly discomfort.  If you are being admitted to the hospital overnight, leave your suitcase in the car. After surgery it may be brought to your room.  In case of increased patient census, it may be necessary for you, the patient, to continue your postoperative care in the Same Day Surgery department.  If you are being discharged the day of surgery, you will not be allowed to drive home. You will need a responsible individual to drive you home and stay with you for 24 hours after surgery.   If you are taking public transportation, you will  need to have a responsible individual with you.  Please call the Pre-admissions Testing Dept. at [phone] if you have any questions about these instructions.  Surgery Visitation Policy:  Patients having surgery or a procedure may have two visitors.  Children under the age of 11 must have an adult with them who is not the patient.  Inpatient Visitation:    Visiting hours are 7 a.m. to 8 p.m. Up to four visitors are allowed at one time in a patient room. The visitors may rotate out with other people during the day.  One visitor age 33 or older may stay with the patient overnight and must be in the room by 8 p.m.    Preparing for Surgery with CHLORHEXIDINE  GLUCONATE (CHG) Soap  Chlorhexidine  Gluconate (CHG) Soap  o An antiseptic cleaner that kills germs and bonds with the skin to continue killing germs even after washing  o Used for showering the night before surgery and morning of surgery  Before surgery, you can play an important role by reducing the number of germs on your skin.  CHG (Chlorhexidine  gluconate) soap is an antiseptic cleanser which kills germs and bonds with the skin to continue killing germs even after washing.  Please do not use if you have an allergy to CHG or antibacterial soaps. If your skin becomes reddened/irritated stop using the CHG.  1. Shower the NIGHT BEFORE SURGERY and the MORNING OF SURGERY with CHG soap.  2. If you choose to wash your hair, wash your hair first as usual with your normal shampoo.  3. After shampooing, rinse your hair and body thoroughly to remove the shampoo.  4. Use CHG as you would any other liquid soap. You can apply CHG directly to the skin and wash gently with a scrungie or a clean washcloth.  5. Apply the CHG soap to your body only from the neck down. Do not use on open wounds or open sores. Avoid contact with your eyes, ears, mouth, and genitals (private parts). Wash face and genitals (private parts) with your normal  soap.  6. Wash thoroughly, paying special attention to the area where your surgery will be performed.  7. Thoroughly rinse your body with warm water .  8. Do not shower/wash with your normal soap after using and rinsing off the CHG soap.  9. Pat yourself dry with a clean towel.  10. Wear clean pajamas to bed the night before surgery.  12. Place clean sheets on your bed the night of your first shower and do not sleep with pets.  13. Shower again with the CHG soap on the day of surgery prior to arriving at the hospital.  14. Do not apply any deodorants/lotions/powders.  15. Please wear clean clothes to the hospital.

## 2023-06-22 MED ORDER — CLINDAMYCIN PHOSPHATE 900 MG/50ML IV SOLN
900.0000 mg | INTRAVENOUS | Status: AC
  Administered 2023-06-23: 900 mg via INTRAVENOUS

## 2023-06-22 MED ORDER — ORAL CARE MOUTH RINSE: 15.0000 mL | Freq: Once | OROMUCOSAL | Status: DC

## 2023-06-22 MED ORDER — CHLORHEXIDINE GLUCONATE 0.12 % MT SOLN: 15.0000 mL | Freq: Once | OROMUCOSAL | Status: DC

## 2023-06-22 MED ORDER — LACTATED RINGERS IV SOLN: INTRAVENOUS | Status: DC

## 2023-06-23 ENCOUNTER — Other Ambulatory Visit: Payer: Self-pay

## 2023-06-23 ENCOUNTER — Encounter
Admission: RE | Disposition: A | Discharge status: Home / Self Care | Payer: Self-pay | Source: Home / Self Care | Attending: General Surgery

## 2023-06-23 ENCOUNTER — Ambulatory Visit
Admission: RE | Admit: 2023-06-23 | Discharge: 2023-06-23 | Disposition: A | Discharge status: Home / Self Care | Payer: 59 | Source: Ambulatory Visit | Attending: General Surgery | Admitting: General Surgery

## 2023-06-23 ENCOUNTER — Ambulatory Visit: Payer: 59 | Admitting: Anesthesiology

## 2023-06-23 ENCOUNTER — Encounter: Payer: Self-pay | Admitting: Licensed Clinical Social Worker

## 2023-06-23 ENCOUNTER — Encounter: Payer: Self-pay | Admitting: General Surgery

## 2023-06-23 ENCOUNTER — Ambulatory Visit: Payer: Self-pay | Admitting: Licensed Clinical Social Worker

## 2023-06-23 ENCOUNTER — Ambulatory Visit
Admission: RE | Admit: 2023-06-23 | Discharge: 2023-06-23 | Disposition: A | Discharge status: Home / Self Care | Payer: 59 | Attending: General Surgery | Admitting: General Surgery

## 2023-06-23 DIAGNOSIS — Z1379 Encounter for other screening for genetic and chromosomal anomalies: Secondary | ICD-10-CM | POA: Insufficient documentation

## 2023-06-23 DIAGNOSIS — Z1231 Encounter for screening mammogram for malignant neoplasm of breast: Secondary | ICD-10-CM | POA: Diagnosis not present

## 2023-06-23 DIAGNOSIS — N6021 Fibroadenosis of right breast: Secondary | ICD-10-CM | POA: Insufficient documentation

## 2023-06-23 DIAGNOSIS — I10 Essential (primary) hypertension: Secondary | ICD-10-CM | POA: Diagnosis not present

## 2023-06-23 DIAGNOSIS — R928 Other abnormal and inconclusive findings on diagnostic imaging of breast: Secondary | ICD-10-CM | POA: Diagnosis not present

## 2023-06-23 DIAGNOSIS — D0511 Intraductal carcinoma in situ of right breast: Secondary | ICD-10-CM | POA: Insufficient documentation

## 2023-06-23 HISTORY — PX: BREAST LUMPECTOMY: SHX2

## 2023-06-23 SURGERY — PARTIAL MASTECTOMY WITH RADIO FREQUENCY LOCALIZER
Anesthesia: General | Site: Breast | Laterality: Right

## 2023-06-23 MED ORDER — CLINDAMYCIN PHOSPHATE 900 MG/50ML IV SOLN
INTRAVENOUS | Status: AC
  Filled 2023-06-23: qty 50

## 2023-06-23 MED ORDER — MIDAZOLAM HCL 2 MG/2ML IJ SOLN
INTRAMUSCULAR | Status: AC
Start: 2023-06-23 — End: ?
  Filled 2023-06-23: qty 2

## 2023-06-23 MED ORDER — KETAMINE HCL 50 MG/5ML IJ SOSY
PREFILLED_SYRINGE | INTRAMUSCULAR | Status: AC
  Filled 2023-06-23: qty 5

## 2023-06-23 MED ORDER — EPINEPHRINE PF 1 MG/ML IJ SOLN
INTRAMUSCULAR | Status: AC
  Filled 2023-06-23: qty 1

## 2023-06-23 MED ORDER — PHENYLEPHRINE 80 MCG/ML (10ML) SYRINGE FOR IV PUSH (FOR BLOOD PRESSURE SUPPORT)
PREFILLED_SYRINGE | INTRAVENOUS | Status: DC | PRN
  Administered 2023-06-23 (×2): 160 ug via INTRAVENOUS

## 2023-06-23 MED ORDER — FENTANYL CITRATE (PF) 100 MCG/2ML IJ SOLN
INTRAMUSCULAR | Status: DC | PRN
  Administered 2023-06-23 (×2): 50 ug via INTRAVENOUS

## 2023-06-23 MED ORDER — MIDAZOLAM HCL 2 MG/2ML IJ SOLN
INTRAMUSCULAR | Status: DC | PRN
  Administered 2023-06-23: 2 mg via INTRAVENOUS

## 2023-06-23 MED ORDER — FENTANYL CITRATE (PF) 100 MCG/2ML IJ SOLN
INTRAMUSCULAR | Status: AC
  Filled 2023-06-23: qty 2

## 2023-06-23 MED ORDER — GLYCOPYRROLATE 0.2 MG/ML IJ SOLN
INTRAMUSCULAR | Status: DC | PRN
  Administered 2023-06-23: .2 mg via INTRAVENOUS

## 2023-06-23 MED ORDER — CHLORHEXIDINE GLUCONATE 0.12 % MT SOLN
OROMUCOSAL | Status: AC
  Filled 2023-06-23: qty 15

## 2023-06-23 MED ORDER — ACETAMINOPHEN 10 MG/ML IV SOLN
INTRAVENOUS | Status: AC
  Filled 2023-06-23: qty 100

## 2023-06-23 MED ORDER — BUPIVACAINE HCL (PF) 0.5 % IJ SOLN
INTRAMUSCULAR | Status: AC
  Filled 2023-06-23: qty 30

## 2023-06-23 MED ORDER — STERILE WATER FOR IRRIGATION IR SOLN
Status: DC | PRN
  Administered 2023-06-23: 500 mL

## 2023-06-23 MED ORDER — ACETAMINOPHEN 10 MG/ML IV SOLN
INTRAVENOUS | Status: DC | PRN
  Administered 2023-06-23: 1000 mg via INTRAVENOUS

## 2023-06-23 MED ORDER — PROPOFOL 1000 MG/100ML IV EMUL
INTRAVENOUS | Status: AC
  Filled 2023-06-23: qty 100

## 2023-06-23 MED ORDER — KETAMINE HCL 50 MG/5ML IJ SOSY
PREFILLED_SYRINGE | INTRAMUSCULAR | Status: DC | PRN
  Administered 2023-06-23 (×5): 10 mg via INTRAVENOUS

## 2023-06-23 MED ORDER — BUPIVACAINE-EPINEPHRINE (PF) 0.5% -1:200000 IJ SOLN
INTRAMUSCULAR | Status: DC | PRN
  Administered 2023-06-23: 30 mL

## 2023-06-23 MED ORDER — ONDANSETRON HCL 4 MG/2ML IJ SOLN
INTRAMUSCULAR | Status: DC | PRN
  Administered 2023-06-23: 4 mg via INTRAVENOUS

## 2023-06-23 MED ORDER — DEXAMETHASONE SODIUM PHOSPHATE 10 MG/ML IJ SOLN
INTRAMUSCULAR | Status: DC | PRN
  Administered 2023-06-23: 10 mg via INTRAVENOUS

## 2023-06-23 MED ORDER — PROPOFOL 10 MG/ML IV BOLUS
INTRAVENOUS | Status: DC | PRN
  Administered 2023-06-23: 50 ug via INTRAVENOUS
  Administered 2023-06-23: 150 ug/kg/min via INTRAVENOUS
  Administered 2023-06-23 (×2): 50 ug via INTRAVENOUS

## 2023-06-23 MED ORDER — DEXMEDETOMIDINE HCL IN NACL 80 MCG/20ML IV SOLN
INTRAVENOUS | Status: DC | PRN
  Administered 2023-06-23 (×3): 4 ug via INTRAVENOUS
  Administered 2023-06-23 (×2): 8 ug via INTRAVENOUS
  Administered 2023-06-23: 4 ug via INTRAVENOUS

## 2023-06-23 MED ORDER — HYDROCODONE-ACETAMINOPHEN 5-325 MG PO TABS: 1.0000 | ORAL_TABLET | ORAL | 0 refills | Status: AC | PRN

## 2023-06-23 SURGICAL SUPPLY — 36 items
BLADE SURG 15 STRL LF DISP TIS (BLADE) ×1 IMPLANT
CHLORAPREP W/TINT 26 (MISCELLANEOUS) ×1 IMPLANT
CNTNR URN SCR LID CUP LEK RST (MISCELLANEOUS) ×1 IMPLANT
COVER PROBE FLX POLY STRL (MISCELLANEOUS) IMPLANT
DERMABOND ADVANCED .7 DNX12 (GAUZE/BANDAGES/DRESSINGS) ×1 IMPLANT
DEVICE DUBIN SPECIMEN MAMMOGRA (MISCELLANEOUS) ×1 IMPLANT
DRAPE LAPAROTOMY TRNSV 106X77 (MISCELLANEOUS) ×1 IMPLANT
ELECT CAUTERY BLADE TIP 2.5 (TIP) ×1
ELECT REM PT RETURN 9FT ADLT (ELECTROSURGICAL) ×1
ELECTRODE CAUTERY BLDE TIP 2.5 (TIP) ×1 IMPLANT
ELECTRODE REM PT RTRN 9FT ADLT (ELECTROSURGICAL) ×1 IMPLANT
GLOVE BIO SURGEON STRL SZ 6.5 (GLOVE) ×1 IMPLANT
GLOVE BIOGEL PI IND STRL 6.5 (GLOVE) ×1 IMPLANT
GOWN STRL REUS W/ TWL LRG LVL3 (GOWN DISPOSABLE) ×3 IMPLANT
KIT MARKER MARGIN INK (KITS) IMPLANT
KIT TURNOVER KIT A (KITS) ×1 IMPLANT
LABEL OR SOLS (LABEL) ×1 IMPLANT
MANIFOLD NEPTUNE II (INSTRUMENTS) ×1 IMPLANT
MARKER MARGIN CORRECT CLIP (MARKER) IMPLANT
NDL HYPO 25X1 1.5 SAFETY (NEEDLE) ×1 IMPLANT
NEEDLE HYPO 25X1 1.5 SAFETY (NEEDLE) ×1
PACK BASIN MINOR ARMC (MISCELLANEOUS) ×1 IMPLANT
RETRACTOR RING XSMALL (MISCELLANEOUS) IMPLANT
RTRCTR WOUND ALEXIS 13CM XS SH (MISCELLANEOUS) ×1
SHEATH BREAST BIOPSY SKIN MKR (SHEATH) ×1 IMPLANT
SPONGE T-LAP 18X18 ~~LOC~~+RFID (SPONGE) IMPLANT
SUT MNCRL 4-0 27XMFL (SUTURE) ×1
SUT SILK 2 0 SH (SUTURE) ×1 IMPLANT
SUT VIC AB 3-0 SH 27X BRD (SUTURE) ×1 IMPLANT
SUTURE MNCRL 4-0 27XMF (SUTURE) ×1 IMPLANT
SYR 10ML LL (SYRINGE) ×1 IMPLANT
SYR BULB IRRIG 60ML STRL (SYRINGE) ×1 IMPLANT
TRAP FLUID SMOKE EVACUATOR (MISCELLANEOUS) ×1 IMPLANT
TRAP NEPTUNE SPECIMEN COLLECT (MISCELLANEOUS) ×1 IMPLANT
WATER STERILE IRR 1000ML POUR (IV SOLUTION) ×1 IMPLANT
WATER STERILE IRR 500ML POUR (IV SOLUTION) ×1 IMPLANT

## 2023-06-23 NOTE — Transfer of Care (Signed)
 Immediate Anesthesia Transfer of Care Note  Patient: Nichole Gordon  Procedure(s) Performed: PARTIAL MASTECTOMY WITH RADIO FREQUENCY LOCALIZER (Right: Breast)  Patient Location: PACU  Anesthesia Type:General  Level of Consciousness: drowsy and patient cooperative  Airway & Oxygen Therapy: Patient Spontanous Breathing and Patient connected to face mask oxygen  Post-op Assessment: Report given to RN and Post -op Vital signs reviewed and stable  Post vital signs: stable  Last Vitals:  Vitals Value Taken Time  BP 123/67 06/23/23 1207  Temp    Pulse 79 06/23/23 1210  Resp 15 06/23/23 1210  SpO2 98 % 06/23/23 1210  Vitals shown include unfiled device data.  Last Pain:  Vitals:   06/23/23 0901  TempSrc: Oral  PainSc: 0-No pain         Complications: No notable events documented.

## 2023-06-23 NOTE — Discharge Instructions (Addendum)

## 2023-06-23 NOTE — Anesthesia Preprocedure Evaluation (Addendum)
 Anesthesia Evaluation  Patient identified by MRN, date of birth, ID band Patient awake    Reviewed: Allergy & Precautions, H&P , NPO status , Patient's Chart, lab work & pertinent test results  Airway Mallampati: III  TM Distance: >3 FB Neck ROM: full    Dental no notable dental hx.    Pulmonary neg pulmonary ROS   Pulmonary exam normal        Cardiovascular hypertension, Normal cardiovascular exam+ Valvular Problems/Murmurs      Neuro/Psych  PSYCHIATRIC DISORDERS Anxiety     negative neurological ROS     GI/Hepatic negative GI ROS, Neg liver ROS,,,  Endo/Other  Hypothyroidism    Renal/GU      Musculoskeletal   Abdominal  (+) + obese  Peds  Hematology negative hematology ROS (+)   Anesthesia Other Findings Past Medical History: No date: Anxiety No date: Family history of adverse reaction to anesthesia     Comment:  Mother - low BP No date: Fibrocystic breast disease No date: GERD (gastroesophageal reflux disease) No date: Heart murmur No date: Hypertension No date: Hypothyroidism 08/15/2022: Mixed hyperlipidemia No date: Motion sickness     Comment:  car back seat  No date: Palpitations No date: Seizures (HCC) No date: Wears contact lenses  Past Surgical History: 06/17/2019: BREAST BIOPSY; Right     Comment:  stereotactic biopsy, x-clip,  MULTIPLE FRAGMENTS OF CYST              WALL FOCAL USUAL DUCTAL HYPERPLASIA, COLUMNAR CELL               CHANGE, AND FIBROSIS. 05/26/2023: BREAST BIOPSY; Right     Comment:  rt br stereo, calcs, x clip, path pending. 05/26/2023: BREAST BIOPSY; Right     Comment:  MM RT BREAST BX W LOC DEV 1ST LESION IMAGE BX SPEC               STEREO GUIDE 05/26/2023 [facility] 06/17/2023: BREAST BIOPSY; Right     Comment:  MM RT RADIO FREQUENCY TAG LOC MAMMO GUIDE 06/17/2023               [facility] 01/25/2019: COLONOSCOPY WITH PROPOFOL ; N/A     Comment:  Procedure:  COLONOSCOPY WITH PROPOFOL ;  Surgeon: Jinny Carmine, MD;  Location: [facility];  Service:               Endoscopy;  Laterality: N/A; 2012: LITHOTRIPSY 01/25/2019: POLYPECTOMY     Comment:  Procedure: POLYPECTOMY;  Surgeon: Jinny Carmine, MD;                Location: [facility];  Service: Endoscopy;;     Reproductive/Obstetrics negative OB ROS                             Anesthesia Physical Anesthesia Plan  ASA: 2  Anesthesia Plan: General LMA   Post-op Pain Management: Ofirmev  IV (intra-op)* and Toradol IV (intra-op)*   Induction: Intravenous  PONV Risk Score and Plan: 3 and Dexamethasone , Ondansetron  and Midazolam   Airway Management Planned: LMA  Additional Equipment:   Intra-op Plan:   Post-operative Plan: Extubation in OR  Informed Consent: I have reviewed the patients History and Physical, chart, labs and discussed the procedure including the risks, benefits and alternatives for the proposed anesthesia with the patient or authorized representative who has indicated his/her understanding  and acceptance.     Dental Advisory Given  Plan Discussed with: Anesthesiologist, CRNA and Surgeon  Anesthesia Plan Comments:         Anesthesia Quick Evaluation

## 2023-06-23 NOTE — Anesthesia Postprocedure Evaluation (Signed)
 Anesthesia Post Note  Patient: Nichole Gordon  Procedure(s) Performed: PARTIAL MASTECTOMY WITH RADIO FREQUENCY LOCALIZER (Right: Breast)  Patient location during evaluation: PACU Anesthesia Type: General Level of consciousness: awake and alert Pain management: pain level controlled Vital Signs Assessment: post-procedure vital signs reviewed and stable Respiratory status: spontaneous breathing, nonlabored ventilation and respiratory function stable Cardiovascular status: blood pressure returned to baseline and stable Postop Assessment: no apparent nausea or vomiting Anesthetic complications: no   No notable events documented.   Last Vitals:  Vitals:   06/23/23 1320 06/23/23 1321  BP:  (!) 141/84  Pulse: 72   Resp: 16   Temp: 37.1 C   SpO2: 93%     Last Pain:  Vitals:   06/23/23 1321  TempSrc:   PainSc: 0-No pain                 Camellia Merilee Louder

## 2023-06-23 NOTE — Progress Notes (Signed)
 HPI:   Ms. Sillas was previously seen in the [facility] due to a personal and family history of cancer and concerns regarding a hereditary predisposition to cancer. Please refer to our prior cancer genetics clinic note for more information regarding our discussion, assessment and recommendations, at the time. Ms. Wildasin recent genetic test results were disclosed to her, as were recommendations warranted by these results. These results and recommendations are discussed in more detail below.  CANCER HISTORY:  Oncology History   No history exists.    FAMILY HISTORY:  We obtained a detailed, 4-generation family history.  Significant diagnoses are listed below: Family History  Problem Relation Age of Onset   Hyperlipidemia Mother    Hypertension Mother    Arthritis Mother    Ovarian cancer Mother 22   Colon cancer Father 52   Melanoma Sister    Hypertension Brother    Melanoma Brother    Breast cancer Paternal Aunt 71   Breast cancer Paternal Aunt 42   Breast cancer Paternal Aunt 62   Lung cancer Paternal Uncle    Stroke Maternal Grandfather    Hypertension Maternal Grandfather    Lymphoma Paternal Grandmother 36   Breast cancer Other 26    Ms. Smedberg has 2 sons, 30 and 38, and 1 daughter, 61. She has 1 brother and 1 sister, both have had melanoma.    Ms. Cieslewicz mother had ovarian cancer at 48 and passed at 31, no known genetic testing. No other known cancers on this side of the family.   Ms. Collyer father had colon cancer at 14 and passed at 19. 3 paternal aunts had breast cancer in their 20s. A paternal cousin also had breast cancer, she was diagnosed at 59. A paternal uncle had lung cancer. Paternal grandmother had lymphoma at 35 and passed at 44.    Ms. Windholz is unaware of previous family history of genetic testing for hereditary cancer risks. There is no reported Ashkenazi Jewish ancestry. There is no known consanguinity.     GENETIC TEST RESULTS:   The [facility] BRCAPlus + CancerNext+RNA Panel found no pathogenic mutations.   The [facility] CancerNext+RNAinsight Panel includes sequencing, rearrangement analysis, and RNA analysis for the following 39 genes: APC, ATM, BAP1, BARD1, BMPR1A, BRCA1, BRCA2, BRIP1, CDH1, CDKN2A, CHEK2, FH, FLCN, MET, MLH1, MSH2, MSH6, MUTYH, NF1, NTHL1, PALB2, PMS2, PTEN, RAD51C, RAD51D, SMAD4, STK11, TP53, TSC1, TSC2, and VHL (sequencing and deletion/duplication); AXIN2, HOXB13, MBD4, MSH3, POLD1 and POLE (sequencing only); EPCAM and GREM1 (deletion/duplication only).   The test report has been scanned into EPIC and is located under the Molecular Pathology section of the Results Review tab.  A portion of the result report is included below for reference. Genetic testing reported out on 06/21/2023.      Even though a pathogenic variant was not identified, possible explanations for the cancer in the family may include: There may be no hereditary risk for cancer in the family. The cancers in Ms. Abt and/or her family may be sporadic/familial or due to other genetic and environmental factors. There may be a gene mutation in one of these genes that current testing methods cannot detect but that chance is small. There could be another gene that has not yet been discovered, or that we have not yet tested, that is responsible for the cancer diagnoses in the family.  It is also possible there is a hereditary cause for the cancer in the family that Ms. Bethards did not inherit  Therefore, it is important to remain in touch with cancer genetics in the future so that we can continue to offer Ms. Rowlands the most up to date genetic testing.   ADDITIONAL GENETIC TESTING:  We discussed with Ms. Brose that her genetic testing was fairly extensive.  If there are additional relevant genes identified to increase cancer risk that can be analyzed in the future, we would be happy to discuss and coordinate this testing at that time.    CANCER  SCREENING RECOMMENDATIONS:  Ms. Ziff test result is considered negative (normal).  This means that we have not identified a hereditary cause for her personal and family history of cancer at this time.   An individual's cancer risk and medical management are not determined by genetic test results alone. Overall cancer risk assessment incorporates additional factors, including personal medical history, family history, and any available genetic information that may result in a personalized plan for cancer prevention and surveillance. Therefore, it is recommended she continue to follow the cancer management and screening guidelines provided by her oncology and primary healthcare provider.  Based on the reported personal and family history, specific cancer screenings for Ms. Heron ONEIDA Bethel and her family include:  Colon Cancer Screening: Due to Ms. Wike's family's history of colon cancer, she is recommended to repeat colonoscopies at least every 5 years starting at 60 (if first degree relative) or 10 years before earliest colorectal cancer diagnosis. More frequent colonoscopies may be recommended if polyps are identified.  RECOMMENDATIONS FOR FAMILY MEMBERS:   Since she did not inherit a identifiable mutation in a cancer predisposition gene included on this panel, her children could not have inherited a known mutation from her in one of these genes. Individuals in this family might be at some increased risk of developing cancer, over the general population risk, due to the family history of cancer.  Individuals in the family should notify their providers of the family history of cancer. We recommend women in this family have a yearly mammogram beginning at age 69, or 56 years younger than the earliest onset of cancer, an annual clinical breast exam, and perform monthly breast self-exams.  Family members should have colonoscopies by at age 88, or earlier, as recommended by their providers. Other members  of the family may still carry a pathogenic variant in one of these genes that Ms. Hoots did not inherit. Based on the family history, we recommend her maternal and paternal relatives have genetic counseling and testing. Ms. Janus will let us  know if we can be of any assistance in coordinating genetic counseling and/or testing for these family members.  FOLLOW-UP:  Lastly, we discussed with Ms. Colombo that cancer genetics is a rapidly advancing field and it is possible that new genetic tests will be appropriate for her and/or her family members in the future. We encouraged her to remain in contact with cancer genetics on an annual basis so we can update her personal and family histories and let her know of advances in cancer genetics that may benefit this family.   Our contact number was provided. Ms. Dina questions were answered to her satisfaction, and she knows she is welcome to call us  at anytime with additional questions or concerns.    Dena Cary, MS, [facility] Genetic Counselor [email or web address] Phone: [phone]

## 2023-06-23 NOTE — Op Note (Signed)
 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  Postoperative diagnosis: Same.   Procedure: Right radiofrequency tag-localized partial mastectomy.             Anesthesia: GETA  Surgeon: Dr. Cesar Coe  Wound Classification: Clean  Indications: Patient is a 58 y.o. female with a nonpalpable right breast mass noted on mammography with core biopsy demonstrating low grade ductal carcinoma in situ requires radiofrequency tag-localized partial mastectomy for treatment.   Findings: 1. Specimen mammography shows marker and tag on specimen 2. Pathology call refers gross examination of margins was not reliable due to small non mass low grade DCIS 3. No other palpable mass or lymph node identified.   Description of procedure: Preoperative radiofrequency tag localization was performed by radiology.  Localization studies were reviewed. The patient was taken to the operating room and placed supine on the operating table, and after general anesthesia the right chest and axilla were prepped and draped in the usual sterile fashion. A time-out was completed verifying correct patient, procedure, site, positioning, and implant(s) and/or special equipment prior to beginning this procedure.  By comparing the localization studies and interrogation with [facility] device, the probable trajectory and location of the mass was visualized. A circumareolar skin incision was planned in such a way as to minimize the amount of dissection to reach the mass.  The skin incision was made. Flaps were raised and the location of the tag was confirmed with [facility] device confirmed. A 2-0 silk figure-of-eight stay suture was placed and used for retraction. Dissection was then taken down circumferentially, taking care to include the entire localizing tag and a wide margin of grossly normal tissue. The specimen and entire localizing tag were removed. The specimen was oriented and sent to radiology with the  localization studies. Confirmation was received that the entire target lesion had been resected. The wound was irrigated. Hemostasis was checked. The wound was closed with interrupted sutures of 3-0 Vicryl and a subcuticular suture of Monocryl 3-0. No attempt was made to close the dead space.   The patient tolerated the procedure well and was taken to the postanesthesia care unit in stable condition.   Specimen: Right Breast mass                      Complications: None  Estimated Blood Loss: 10 mL

## 2023-06-23 NOTE — Interval H&P Note (Signed)
 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  The various methods of treatment have been discussed with the patient and family. After consideration of risks, benefits and other options for treatment, the patient has consented to  Procedure(s): PARTIAL MASTECTOMY WITH RADIO FREQUENCY LOCALIZER (Right) as a surgical intervention.  The patient's history has been reviewed, patient examined, no change in status, stable for surgery.  I have reviewed the patient's chart and labs.  Questions were answered to the patient's satisfaction.     Lucas Sjogren

## 2023-06-24 ENCOUNTER — Other Ambulatory Visit: Payer: Self-pay | Admitting: Pathology

## 2023-06-24 ENCOUNTER — Ambulatory Visit
Admission: RE | Admit: 2023-06-24 | Discharge: 2023-06-24 | Disposition: A | Discharge status: Home / Self Care | Payer: 59 | Source: Ambulatory Visit | Attending: Pathology | Admitting: Pathology

## 2023-06-24 DIAGNOSIS — M795 Residual foreign body in soft tissue: Secondary | ICD-10-CM

## 2023-06-25 ENCOUNTER — Other Ambulatory Visit: Payer: Self-pay | Admitting: Pathology

## 2023-06-25 LAB — SURGICAL PATHOLOGY

## 2023-06-30 ENCOUNTER — Other Ambulatory Visit: Payer: 59

## 2023-06-30 ENCOUNTER — Telehealth: Payer: Self-pay | Admitting: Genetic Counselor

## 2023-06-30 NOTE — Progress Notes (Signed)
 Tumor Board Documentation  Nichole Gordon was presented by Nichole Ada, RN at our Tumor Board on 06/30/2023, which included representatives from medical oncology, radiation oncology, surgical, radiology, pathology, navigation, research, palliative care.  Nichole Gordon currently presents as a current patient with history of the following treatments: surgical intervention(s).  Additionally, we reviewed previous medical and familial history, history of present illness, and recent lab results along with all available histopathologic and imaging studies. The tumor board considered available treatment options and made the following recommendations: Adjuvant radiation, Hormonal therapy    The following procedures/referrals were also placed: No orders of the defined types were placed in this encounter.   Clinical Trial Status: not discussed   Staging used: AJCC Stage Group  National site-specific guidelines NCCN were discussed with respect to the case.  Tumor board is a meeting of clinicians from various specialty areas who evaluate and discuss patients for whom a multidisciplinary approach is being considered. Final determinations in the plan of care are those of the provider(s). The responsibility for follow up of recommendations given during tumor board is that of the provider.   Today's extended care, comprehensive team conference, Nichole Gordon was not present for the discussion and was not examined.   Multidisciplinary Tumor Board is a multidisciplinary case peer review process.  Decisions discussed in the Multidisciplinary Tumor Board reflect the opinions of the specialists present at the conference without having examined the patient.  Ultimately, treatment and diagnostic decisions rest with the primary provider(s) and the patient.

## 2023-06-30 NOTE — Telephone Encounter (Signed)
 Disclosed negative genetics as patient has not yet viewed MyChart results.  See detailed clinic note (06/23/2023) for recommendations.

## 2023-07-02 ENCOUNTER — Inpatient Hospital Stay: Payer: 59 | Attending: Oncology | Admitting: Hospice and Palliative Medicine

## 2023-07-02 DIAGNOSIS — D0511 Intraductal carcinoma in situ of right breast: Secondary | ICD-10-CM

## 2023-07-02 NOTE — Progress Notes (Signed)
 Multidisciplinary Oncology Council Documentation  Nichole Gordon was presented by our [facility] on 07/02/2023, which included representatives from:  Palliative Care Dietitian  Physical/Occupational Therapist Nurse Navigator Genetics Social work Survivorship RN Financial Navigator Research RN   Nichole Gordon currently presents with history of DCIS  We reviewed previous medical and familial history, history of present illness, and recent lab results along with all available histopathologic and imaging studies. The MOC considered available treatment options and made the following recommendations/referrals:  Rehab screening, SW  The MOC is a meeting of clinicians from various specialty areas who evaluate and discuss patients for whom a multidisciplinary approach is being considered. Final determinations in the plan of care are those of the provider(s).   Today's extended care, comprehensive team conference, Nichole Gordon was not present for the discussion and was not examined.

## 2023-07-04 ENCOUNTER — Inpatient Hospital Stay: Payer: 59

## 2023-07-04 NOTE — Progress Notes (Signed)
[facility] Clinical Social Work  Initial Assessment   Nichole Gordon is a 58 y.o. year old female contacted by phone. Clinical Social Work was referred by  [facility]  for assessment of psychosocial needs.   SDOH (Social Determinants of Health) assessments performed: Yes SDOH Interventions    Flowsheet Row Office Visit from 01/16/2021 in [facility] Office Visit from 01/13/2020 in [facility] Office Visit from 02/26/2018 in [facility] Office Visit from 08/26/2017 in [facility] Office Visit from 05/23/2015 in [facility]  SDOH Interventions       Depression Interventions/Treatment  Currently on Treatment Medication Medication, Currently on Treatment Currently on Treatment Medication       SDOH Screenings   Food Insecurity: No Food Insecurity (06/12/2023)   [facility]  Housing: Low Risk  (06/26/2023)   Received from [facility]  Transportation Needs: No Transportation Needs (06/12/2023)   Received from [facility]  Utilities: Not At Risk (06/12/2023)   Received from [facility]  Depression [id number]): Low Risk  (06/02/2023)  Financial Resource Strain: Low Risk  (06/12/2023)   Received from [facility]  Tobacco Use: Low Risk  (06/26/2023)   Received from [facility]     Distress Screen completed: No    06/02/2023    1:27 PM  ONCBCN DISTRESS SCREENING  Screening Type Initial Screening  Distress experienced in past week (1-10) 0      Family/Social Information:  Housing Arrangement: patient lives with her husband, jeffrey Family members/support persons in your life? Family Transportation concerns: no  Employment: Working full time  Income source: Educational psychologist concerns: No Type of concern: None Food access concerns: no Religious or spiritual  practice: Yes-Patient identifes as [employer] Currently in place:  Community education officer  Coping/ Adjustment to diagnosis: Patient understands treatment plan and what happens next? yes Concerns about diagnosis and/or treatment:  Patient expressed no concerns. Patient reported stressors:  Patient denied. Hopes and/or priorities: Family Patient enjoys time with family/ friends Current coping skills/ strengths: Average or above average intelligence , Capable of independent living , Communication skills , Financial means , General fund of knowledge , and Supportive family/friends     SUMMARY: Current SDOH Barriers:  None identified by patient.  Clinical Social Work Clinical Goal(s):  No clinical social work goals at this time  Interventions: Discussed common feeling and emotions when being diagnosed with cancer, and the importance of support during treatment Informed patient of the support team roles and support services at [facility] Provided CSW contact information and encouraged patient to call with any questions or concerns Provided patient with information about the [employer].  Patient was interested in the free massages.  CSW mailed certificates, along with Doyle Askew and Wellness information.   Follow Up Plan: CSW will follow-up with patient by phone  Patient verbalizes understanding of plan: Yes    Dorothey Baseman, LCSW Clinical Social Worker [facility]

## 2023-07-08 ENCOUNTER — Encounter: Payer: Self-pay | Admitting: Oncology

## 2023-07-08 ENCOUNTER — Ambulatory Visit
Admission: RE | Admit: 2023-07-08 | Discharge: 2023-07-08 | Disposition: A | Discharge status: Home / Self Care | Payer: 59 | Source: Ambulatory Visit | Attending: Radiation Oncology | Admitting: Radiation Oncology

## 2023-07-08 ENCOUNTER — Inpatient Hospital Stay: Payer: 59 | Admitting: Oncology

## 2023-07-08 VITALS — BP 134/78 | HR 95 | Temp 98.2°F | Resp 18 | Wt 170.6 lb

## 2023-07-08 DIAGNOSIS — Z8041 Family history of malignant neoplasm of ovary: Secondary | ICD-10-CM

## 2023-07-08 DIAGNOSIS — D0511 Intraductal carcinoma in situ of right breast: Secondary | ICD-10-CM

## 2023-07-08 DIAGNOSIS — H524 Presbyopia: Secondary | ICD-10-CM | POA: Diagnosis not present

## 2023-07-08 DIAGNOSIS — Z79811 Long term (current) use of aromatase inhibitors: Secondary | ICD-10-CM | POA: Diagnosis not present

## 2023-07-08 DIAGNOSIS — Z170 Estrogen receptor positive status [ER+]: Secondary | ICD-10-CM | POA: Diagnosis not present

## 2023-07-08 MED ORDER — LETROZOLE 2.5 MG PO TABS: 2.5000 mg | ORAL_TABLET | Freq: Every day | ORAL | 2 refills | Status: DC

## 2023-07-08 NOTE — Consult Note (Signed)
NEW PATIENT EVALUATION  Name: Nichole Gordon  MRN: [id number]  Date:   07/08/2023     DOB: 12-06-1965   This 58 y.o. 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  REFERRING PHYSICIAN: Olevia Perches P, DO  CHIEF COMPLAINT: No chief complaint on file.   DIAGNOSIS: The encounter diagnosis was Ductal carcinoma in situ (DCIS) of right breast.   PREVIOUS INVESTIGATIONS:  Mammogram and ultrasound reviewed Clinical notes reviewed Pathology report reviewed  HPI: Patient is a 58 year old female who presents with an abnormal mammogram of her right breast.  There is a 3 mm group of amorphous microcalcifications in the upper outer right breast new from previous exam.  She underwent ultrasound-guided biopsy which was positive for ductal carcinoma in situ.  She went on to have a wide local excision for a 4 mm area of grade 2 ductal carcinoma in situ with expansive comedonecrosis.  All margins were clear closest margin at 9 mm no regional lymph nodes were submitted.  Tumor was strongly ER positive.  She has done well postoperatively.  She had BRCA testing which was negative.  She is seen today for consideration of radiation therapy she is doing well.  She specifically denies breast tenderness cough or bone pain.  PLANNED TREATMENT REGIMEN: Hypofractionated right whole breast radiation  PAST MEDICAL HISTORY:  has a past medical history of Anxiety, Family history of adverse reaction to anesthesia, Fibrocystic breast disease, GERD (gastroesophageal reflux disease), Heart murmur, Hypertension, Hypothyroidism, Mixed hyperlipidemia (08/15/2022), Motion sickness, Palpitations, Seizures (HCC), and Wears contact lenses.    PAST SURGICAL HISTORY:  Past Surgical History:  Procedure Laterality Date   BREAST BIOPSY Right 06/17/2019   stereotactic biopsy, x-clip,  MULTIPLE FRAGMENTS OF  CYST WALL FOCAL USUAL DUCTAL HYPERPLASIA, COLUMNAR CELL CHANGE, AND FIBROSIS.   BREAST BIOPSY Right 05/26/2023   rt br stereo, calcs, x clip, path pending.   BREAST BIOPSY Right 05/26/2023   MM RT BREAST BX W LOC DEV 1ST LESION IMAGE BX SPEC STEREO GUIDE 05/26/2023 [facility]   BREAST BIOPSY Right 06/17/2023   MM RT RADIO FREQUENCY TAG LOC MAMMO GUIDE 06/17/2023 [facility]   COLONOSCOPY WITH PROPOFOL N/A 01/25/2019   Procedure: COLONOSCOPY WITH PROPOFOL;  Surgeon: Midge Minium, MD;  Location: [facility];  Service: Endoscopy;  Laterality: N/A;   LITHOTRIPSY  2012   POLYPECTOMY  01/25/2019   Procedure: POLYPECTOMY;  Surgeon: Midge Minium, MD;  Location: [facility];  Service: Endoscopy;;    FAMILY HISTORY: family history includes Arthritis in her mother; Breast cancer (age of onset: 45) in an other family member; Breast cancer (age of onset: 56) in her paternal aunt, paternal aunt, and paternal aunt; Colon cancer (age of onset: 2) in her father; Hyperlipidemia in her mother; Hypertension in her brother, maternal grandfather, and mother; Lung cancer in her paternal uncle; Lymphoma (age of onset: 23) in her paternal grandmother; Melanoma in her brother and sister; Ovarian cancer (age of onset: 83) in her mother; Stroke in her maternal grandfather.  SOCIAL HISTORY:  reports that she has never smoked. She has never used smokeless tobacco. She reports that she does not drink alcohol and does not use drugs.  ALLERGIES: Amoxicillin, Omnipaque [iohexol], Phenytoin sodium extended, Depakote [valproic acid], Iodinated contrast media, and Trileptal [oxcarbazepine]  MEDICATIONS:  Current Outpatient Medications  Medication Sig Dispense Refill   b complex vitamins capsule  Take 1 capsule by mouth daily.     Cholecalciferol (VITAMIN D) 2000 UNITS CAPS Take 2,000 Units by mouth daily.     escitalopram (LEXAPRO) 20 MG tablet Take 1 tablet (20 mg total) by mouth daily. 90 tablet 1    fluticasone (FLONASE) 50 MCG/ACT nasal spray Place 1 spray into both nostrils 2 (two) times daily.      letrozole (FEMARA) 2.5 MG tablet Take 1 tablet (2.5 mg total) by mouth daily. 30 tablet 2   levothyroxine (SYNTHROID) 25 MCG tablet Take 1 tablet (25 mcg total) by mouth daily. 90 tablet 3   loratadine (CLARITIN) 10 MG tablet Take 10 mg by mouth daily.     losartan (COZAAR) 25 MG tablet Take 1 tablet (25 mg total) by mouth daily. 90 tablet 1   Multiple Vitamin (MULTIVITAMIN) capsule Take 1 capsule by mouth daily.       naproxen (NAPROSYN) 500 MG tablet Take 1 tablet (500 mg total) by mouth 2 (two) times daily with a meal. (Patient taking differently: Take 500 mg by mouth 2 (two) times daily as needed for moderate pain (pain score 4-6).) 180 tablet 1   Omega-3 Fatty Acids (FISH OIL PO) Take 2 capsules by mouth daily.     omeprazole (PRILOSEC) 20 MG capsule Take 1 capsule (20 mg total) by mouth daily. (Patient taking differently: Take 20 mg by mouth daily as needed (acid reflux).) 90 capsule 1   No current facility-administered medications for this encounter.    ECOG PERFORMANCE STATUS:  0 - Asymptomatic  REVIEW OF SYSTEMS: Patient denies any weight loss, fatigue, weakness, fever, chills or night sweats. Patient denies any loss of vision, blurred vision. Patient denies any ringing  of the ears or hearing loss. No irregular heartbeat. Patient denies heart murmur or history of fainting. Patient denies any chest pain or pain radiating to her upper extremities. Patient denies any shortness of breath, difficulty breathing at night, cough or hemoptysis. Patient denies any swelling in the lower legs. Patient denies any nausea vomiting, vomiting of blood, or coffee ground material in the vomitus. Patient denies any stomach pain. Patient states has had normal bowel movements no significant constipation or diarrhea. Patient denies any dysuria, hematuria or significant nocturia. Patient denies any problems  walking, swelling in the joints or loss of balance. Patient denies any skin changes, loss of hair or loss of weight. Patient denies any excessive worrying or anxiety or significant depression. Patient denies any problems with insomnia. Patient denies excessive thirst, polyuria, polydipsia. Patient denies any swollen glands, patient denies easy bruising or easy bleeding. Patient denies any recent infections, allergies or URI. Patient "s visual fields have not changed significantly in recent time.   PHYSICAL EXAM: LMP 09/30/2018 (Approximate)  Right breast has a wide local excision scar which is healing well no dominant masses noted in either breast.  No axillary or supraclavicular adenopathy is identified.  Well-developed well-nourished patient in NAD. HEENT reveals PERLA, EOMI, discs not visualized.  Oral cavity is clear. No oral mucosal lesions are identified. Neck is clear without evidence of cervical or supraclavicular adenopathy. Lungs are clear to A&P. Cardiac examination is essentially unremarkable with regular rate and rhythm without murmur rub or thrill. Abdomen is benign with no organomegaly or masses noted. Motor sensory and DTR levels are equal and symmetric in the upper and lower extremities. Cranial nerves II through XII are grossly intact. Proprioception is intact. No peripheral adenopathy or edema is identified. No motor or sensory levels are  noted. Crude visual fields are within normal range.  LABORATORY DATA: Pathology reports reviewed    RADIOLOGY RESULTS: Mammograms ultrasound reviewed compatible with above-stated findings   IMPRESSION: Stage 0 ER positive ductal carcinoma site of the right breast status post wide local excision in 58 year old female  PLAN: This time I have recommended a whole course of whole breast radiation to her right breast.  Will plan on delivering hypofractionated course of treatment over 3 weeks boosting her scar another 1000 centigrade using photon beam  therapy.  Risks and benefits of treatment including skin reaction fatigue alteration blood counts possible inclusion of superficial lung all were reviewed in detail with the patient.  She has consented to treatment.  I personally set up and ordered CT simulation for next week.  Patient also be candidate for endocrine therapy after completion of radiation.  I would like to take this opportunity to thank you for allowing me to participate in the care of your patient.Carmina Miller, MD

## 2023-07-08 NOTE — Progress Notes (Signed)
Hematology/Oncology Consult note [facility]  Telephone:([phone] [phone]  Patient Care Team: Dorcas Carrow, DO as PCP - General (Family Medicine) Creig Hines, MD as Consulting Physician (Oncology) Hulen Luster, RN as Oncology Nurse Navigator Carmina Miller, MD as Consulting Physician (Radiation Oncology)   Name of the patient: Nichole Gordon  [id number]  December 23, 1965   Date of visit: 07/08/23  Diagnosis-right breast DCIS ER positive  Chief complaint/ Reason for visit- discuss pathology results and further management  Heme/Onc history: patient is a 58 year old female with a past medical history significant for hypertension hypothyroidism and GERD who underwent a screening mammogram in November 2024 which showed possible calcifications in the right breast.  This was followed by diagnostic mammogram which showed 3 mm group of calcifications in the upper outer quadrant of the right breast.  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  Tumor was ER +100% PR +20%.  Lumpectomy pathology from 06/23/2023 showed at least 4 mm grade 2 DCIS with negative margins.  Comedonecrosis present.  Distance from DCIS to closest margin 9 mm.  Family history significant for ovarian cancer.  Genetic testing negative    Interval history-patient is recovering well from her lumpectomy and denies any specific complaints at this time  ECOG PS- 0 Pain scale- 0   Review of systems- Review of Systems  Constitutional:  Negative for chills, fever, malaise/fatigue and weight loss.  HENT:  Negative for congestion, ear discharge and nosebleeds.   Eyes:  Negative for blurred vision.  Respiratory:  Negative for cough, hemoptysis, sputum production, shortness of breath and wheezing.   Cardiovascular:  Negative for chest pain, palpitations, orthopnea and claudication.  Gastrointestinal:  Negative for abdominal pain, blood in  stool, constipation, diarrhea, heartburn, melena, nausea and vomiting.  Genitourinary:  Negative for dysuria, flank pain, frequency, hematuria and urgency.  Musculoskeletal:  Negative for back pain, joint pain and myalgias.  Skin:  Negative for rash.  Neurological:  Negative for dizziness, tingling, focal weakness, seizures, weakness and headaches.  Endo/Heme/Allergies:  Does not bruise/bleed easily.  Psychiatric/Behavioral:  Negative for depression and suicidal ideas. The patient does not have insomnia.       Allergies  Allergen Reactions   Amoxicillin Rash   Omnipaque [Iohexol] Shortness Of Breath   Phenytoin Sodium Extended Swelling   Depakote [Valproic Acid] Swelling    Hair and falls out    Iodinated Contrast Media Itching   Trileptal [Oxcarbazepine] Swelling     Past Medical History:  Diagnosis Date   Anxiety    Family history of adverse reaction to anesthesia    Mother - low BP   Fibrocystic breast disease    GERD (gastroesophageal reflux disease)    Heart murmur    Hypertension    Hypothyroidism    Mixed hyperlipidemia 08/15/2022   Motion sickness    car back seat    Palpitations    Seizures (HCC)    Wears contact lenses      Past Surgical History:  Procedure Laterality Date   BREAST BIOPSY Right 06/17/2019   stereotactic biopsy, x-clip,  MULTIPLE FRAGMENTS OF CYST WALL FOCAL USUAL DUCTAL HYPERPLASIA, COLUMNAR CELL CHANGE, AND FIBROSIS.   BREAST BIOPSY Right 05/26/2023   rt br stereo, calcs, x clip, path pending.   BREAST BIOPSY Right 05/26/2023   MM RT BREAST BX W LOC DEV 1ST LESION IMAGE BX SPEC STEREO GUIDE 05/26/2023 [facility]   BREAST BIOPSY Right  06/17/2023   MM RT RADIO FREQUENCY TAG LOC MAMMO GUIDE 06/17/2023 [facility]   COLONOSCOPY WITH PROPOFOL N/A 01/25/2019   Procedure: COLONOSCOPY WITH PROPOFOL;  Surgeon: Midge Minium, MD;  Location: [facility];  Service: Endoscopy;  Laterality: N/A;   LITHOTRIPSY  2012   POLYPECTOMY   01/25/2019   Procedure: POLYPECTOMY;  Surgeon: Midge Minium, MD;  Location: [facility];  Service: Endoscopy;;    Social History   Socioeconomic History   Marital status: Married    Spouse name: Not on file   Number of children: Not on file   Years of education: Not on file   Highest education level: Not on file  Occupational History   Not on file  Tobacco Use   Smoking status: Never   Smokeless tobacco: Never  Vaping Use   Vaping status: Never Used  Substance and Sexual Activity   Alcohol use: No   Drug use: No   Sexual activity: Yes    Birth control/protection: None  Other Topics Concern   Not on file  Social History Narrative   Not on file   Social Drivers of Health   Financial Resource Strain: Low Risk  (06/12/2023)   Received from [facility]   Overall Financial Resource Strain (CARDIA)    Difficulty of Paying Living Expenses: Not hard at all  Food Insecurity: No Food Insecurity (06/12/2023)   Received from [facility]   Hunger Vital Sign    Worried About Running Out of Food in the Last Year: Never true    Ran Out of Food in the Last Year: Never true  Transportation Needs: No Transportation Needs (06/12/2023)   Received from [facility] - Transportation    In the past 12 months, has lack of transportation kept you from medical appointments or from getting medications?: No    Lack of Transportation (Non-Medical): No  Physical Activity: Not on file  Stress: Not on file  Social Connections: Not on file  Intimate Partner Violence: Not At Risk (06/02/2023)   Humiliation, Afraid, Rape, and Kick questionnaire    Fear of Current or Ex-Partner: No    Emotionally Abused: No    Physically Abused: No    Sexually Abused: No    Family History  Problem Relation Age of Onset   Hyperlipidemia Mother    Hypertension Mother    Arthritis Mother    Ovarian cancer Mother 36   Colon cancer Father 75    Melanoma Sister    Hypertension Brother    Melanoma Brother    Breast cancer Paternal Aunt 40   Breast cancer Paternal Aunt 35   Breast cancer Paternal Aunt 32   Lung cancer Paternal Uncle    Stroke Maternal Grandfather    Hypertension Maternal Grandfather    Lymphoma Paternal Grandmother 16   Breast cancer Other 66     Current Outpatient Medications:    letrozole (FEMARA) 2.5 MG tablet, Take 1 tablet (2.5 mg total) by mouth daily., Disp: 30 tablet, Rfl: 2   b complex vitamins capsule, Take 1 capsule by mouth daily., Disp: , Rfl:    Cholecalciferol (VITAMIN D) 2000 UNITS CAPS, Take 2,000 Units by mouth daily., Disp: , Rfl:    escitalopram (LEXAPRO) 20 MG tablet, Take 1 tablet (20 mg total) by mouth daily., Disp: 90 tablet, Rfl: 1   fluticasone (FLONASE) 50 MCG/ACT nasal spray, Place 1 spray into both nostrils 2 (two) times daily. , Disp: ,  Rfl:    levothyroxine (SYNTHROID) 25 MCG tablet, Take 1 tablet (25 mcg total) by mouth daily., Disp: 90 tablet, Rfl: 3   loratadine (CLARITIN) 10 MG tablet, Take 10 mg by mouth daily., Disp: , Rfl:    losartan (COZAAR) 25 MG tablet, Take 1 tablet (25 mg total) by mouth daily., Disp: 90 tablet, Rfl: 1   Multiple Vitamin (MULTIVITAMIN) capsule, Take 1 capsule by mouth daily.  , Disp: , Rfl:    naproxen (NAPROSYN) 500 MG tablet, Take 1 tablet (500 mg total) by mouth 2 (two) times daily with a meal. (Patient taking differently: Take 500 mg by mouth 2 (two) times daily as needed for moderate pain (pain score 4-6).), Disp: 180 tablet, Rfl: 1   Omega-3 Fatty Acids (FISH OIL PO), Take 2 capsules by mouth daily., Disp: , Rfl:    omeprazole (PRILOSEC) 20 MG capsule, Take 1 capsule (20 mg total) by mouth daily. (Patient taking differently: Take 20 mg by mouth daily as needed (acid reflux).), Disp: 90 capsule, Rfl: 1  Physical exam:  Vitals:   07/08/23 0831  BP: 134/78  Pulse: 95  Resp: 18  Temp: 98.2 F (36.8 C)  TempSrc: Tympanic  SpO2: 98%  Weight: 170  lb 9.6 oz (77.4 kg)   Physical Exam Cardiovascular:     Rate and Rhythm: Normal rate and regular rhythm.     Heart sounds: Normal heart sounds.  Pulmonary:     Effort: Pulmonary effort is normal.  Skin:    General: Skin is warm and dry.  Neurological:     Mental Status: She is alert and oriented to person, place, and time.         Latest Ref Rng & Units 04/21/2023    1:48 PM  CMP  Glucose 70 - 99 mg/dL 409   BUN 6 - 24 mg/dL 14   Creatinine 8.11 - 1.00 mg/dL 9.14   Sodium 782 - 956 mmol/L 142   Potassium 3.5 - 5.2 mmol/L 4.3   Chloride 96 - 106 mmol/L 101   CO2 20 - 29 mmol/L 23   Calcium 8.7 - 10.2 mg/dL 9.7       Latest Ref Rng & Units 03/03/2023   11:05 AM  CBC  WBC 3.4 - 10.8 x10E3/uL 7.9   Hemoglobin 11.1 - 15.9 g/dL 21.3   Hematocrit 08.6 - 46.6 % 40.2   Platelets 150 - 450 x10E3/uL 237     No images are attached to the encounter.  DG BREAST SURGICAL SPECIMEN NO CHARGE Result Date: 06/24/2023 This procedure is a no report and no charge.  It will auto finalize.  MM Breast Surgical Specimen Result Date: 06/23/2023 CLINICAL DATA:  Status post [facility] localized right breast lumpectomy. EXAM: SPECIMEN RADIOGRAPH OF THE RIGHT BREAST COMPARISON:  Previous exam(s). FINDINGS: Status post excision of the right breast. [facility] reflector and X shaped clip are present within the specimen. IMPRESSION: Specimen radiograph of the right breast. Electronically Signed   By: Elberta Fortis M.D.   On: 06/23/2023 11:50   MM RT RADIO FREQUENCY TAG LOC MAMMO GUIDE Result Date: 06/17/2023 CLINICAL DATA:  58 year old female with DCIS in the RIGHT breast, presenting for SAVI scout localization prior to lumpectomy. Of note, the X shaped biopsy marking clip was noted to be 10 mm laterally displaced from residual calcifications at the site of known DCIS. EXAM: NEEDLE LOCALIZATION OF THE RIGHT BREAST WITH MAMMO GUIDANCE COMPARISON:  Previous exam(s). FINDINGS: Patient presents for needle  localization prior to right lumpectomy. I met with the patient and we discussed the procedure of needle localization including benefits and alternatives. We discussed the high likelihood of a successful procedure. We discussed the risks of the procedure, including infection, bleeding, tissue injury, and further surgery. Informed, written consent was given. The usual time-out protocol was performed immediately prior to the procedure. Using mammographic guidance, sterile technique, 1% lidocaine and a 10 cm SAVI SCOUT needle, the residual calcifications in the upper central right breast were localized using a superior approach. The images were marked for Dr. Hazle Quant. IMPRESSION: Radar reflector localization of the RIGHT breast. No apparent complications. Electronically Signed   By: Jacob Moores M.D.   On: 06/17/2023 16:02     Assessment and plan- Patient is a 58 y.o. female with history of right breast DCIS ER positive s/p lumpectomy here to discuss final pathology results and further management  Discussed the results of final lumpectomy pathology from 06/23/2023 which showed at least 4 mm grade 2 DCIS with negative margins.  Comedonecrosis present.  Tumor was ER positive on biopsy.  As per [facility] DCIS algorithm her 5 and 10-year risk of recurrence of DCIS without any additional intervention would be 12 and 15% respectively.  This risk will go down to 5 and 8% with adjuvant radiation treatment and further down to 2 and 4% with adjuvant endocrine therapy.  She is postmenopausal I would recommend aromatase inhibitor for 5 years.  Discussed risks and benefits of letrozole including all but not limited to mood swings, hot flashes, joint pains, vaginal dryness, hypercholesterolemia.  Patient understands and agrees to proceed as planned.  We will send her a prescription for letrozole but she will start taking it upon completion of radiation treatment.  She has a bone density scan scheduled for March 2025.  I  will see her back in 3 months with a CMP.  Treatment will be given with a curative intent   Cancer Staging  Ductal carcinoma in situ (DCIS) of right breast Staging form: Breast, AJCC 8th Edition - Clinical stage from 07/08/2023: Stage 0 (cTis (DCIS), cN0, cM0, G2, ER+, PR: Not Assessed, HER2: Not Assessed) - Signed by Creig Hines, MD on 07/08/2023 Stage prefix: Initial diagnosis Nuclear grade: G2 Histologic grading system: 3 grade system     Visit Diagnosis 1. Ductal carcinoma in situ (DCIS) of right breast   2. Family history of ovarian cancer      Dr. Owens Shark, MD, MPH [facility] at [facility] [phone] 07/08/2023 9:13 AM

## 2023-07-10 DIAGNOSIS — Z170 Estrogen receptor positive status [ER+]: Secondary | ICD-10-CM | POA: Diagnosis not present

## 2023-07-10 DIAGNOSIS — D0511 Intraductal carcinoma in situ of right breast: Secondary | ICD-10-CM | POA: Diagnosis not present

## 2023-07-16 ENCOUNTER — Encounter: Payer: Self-pay | Admitting: *Deleted

## 2023-07-16 ENCOUNTER — Ambulatory Visit: Payer: 59

## 2023-07-16 ENCOUNTER — Ambulatory Visit
Admission: RE | Admit: 2023-07-16 | Discharge: 2023-07-16 | Disposition: A | Discharge status: Home / Self Care | Payer: 59 | Source: Ambulatory Visit | Attending: Radiation Oncology | Admitting: Radiation Oncology

## 2023-07-16 DIAGNOSIS — D0511 Intraductal carcinoma in situ of right breast: Secondary | ICD-10-CM | POA: Diagnosis not present

## 2023-07-16 DIAGNOSIS — Z170 Estrogen receptor positive status [ER+]: Secondary | ICD-10-CM | POA: Diagnosis not present

## 2023-07-18 ENCOUNTER — Other Ambulatory Visit: Payer: Self-pay | Admitting: *Deleted

## 2023-07-18 DIAGNOSIS — D0511 Intraductal carcinoma in situ of right breast: Secondary | ICD-10-CM

## 2023-07-21 DIAGNOSIS — Z510 Encounter for antineoplastic radiation therapy: Secondary | ICD-10-CM | POA: Insufficient documentation

## 2023-07-21 DIAGNOSIS — Z170 Estrogen receptor positive status [ER+]: Secondary | ICD-10-CM | POA: Diagnosis not present

## 2023-07-21 DIAGNOSIS — D0511 Intraductal carcinoma in situ of right breast: Secondary | ICD-10-CM | POA: Insufficient documentation

## 2023-07-23 ENCOUNTER — Ambulatory Visit
Admission: RE | Admit: 2023-07-23 | Discharge: 2023-07-23 | Disposition: A | Discharge status: Home / Self Care | Payer: 59 | Source: Ambulatory Visit | Attending: Radiation Oncology | Admitting: Radiation Oncology

## 2023-07-23 ENCOUNTER — Inpatient Hospital Stay: Payer: 59 | Attending: Oncology | Admitting: Occupational Therapy

## 2023-07-23 DIAGNOSIS — Z9011 Acquired absence of right breast and nipple: Secondary | ICD-10-CM

## 2023-07-23 DIAGNOSIS — D0511 Intraductal carcinoma in situ of right breast: Secondary | ICD-10-CM | POA: Diagnosis not present

## 2023-07-23 DIAGNOSIS — Z510 Encounter for antineoplastic radiation therapy: Secondary | ICD-10-CM | POA: Diagnosis not present

## 2023-07-23 DIAGNOSIS — Z170 Estrogen receptor positive status [ER+]: Secondary | ICD-10-CM | POA: Diagnosis not present

## 2023-07-23 NOTE — Therapy (Signed)
 [facility] - A Dept Of . [facility] [address] [place], [place], [address] Phone: [phone]   Fax:  [phone]  Occupational Therapy Screen  Patient Details  Name: Nichole Gordon MRN: [id number] Date of Birth: 06/16/1966 No data recorded  Encounter Date: 07/23/2023   OT End of Session - 07/23/23 1339     Visit Number 0             Past Medical History:  Diagnosis Date   Anxiety    Family history of adverse reaction to anesthesia    Mother - low BP   Fibrocystic breast disease    GERD (gastroesophageal reflux disease)    Heart murmur    Hypertension    Hypothyroidism    Mixed hyperlipidemia 08/15/2022   Motion sickness    car back seat    Palpitations    Seizures (HCC)    Wears contact lenses     Past Surgical History:  Procedure Laterality Date   BREAST BIOPSY Right 06/17/2019   stereotactic biopsy, x-clip,  MULTIPLE FRAGMENTS OF CYST WALL FOCAL USUAL DUCTAL HYPERPLASIA, COLUMNAR CELL CHANGE, AND FIBROSIS.   BREAST BIOPSY Right 05/26/2023   rt br stereo, calcs, x clip, path pending.   BREAST BIOPSY Right 05/26/2023   MM RT BREAST BX W LOC DEV 1ST LESION IMAGE BX SPEC STEREO GUIDE 05/26/2023 [facility]   BREAST BIOPSY Right 06/17/2023   MM RT RADIO FREQUENCY TAG LOC MAMMO GUIDE 06/17/2023 [facility]   COLONOSCOPY WITH PROPOFOL  N/A 01/25/2019   Procedure: COLONOSCOPY WITH PROPOFOL ;  Surgeon: Jinny Carmine, MD;  Location[facility];  Service: Endoscopy;  Laterality: N/A;   LITHOTRIPSY  2012   POLYPECTOMY  01/25/2019   Procedure: POLYPECTOMY;  Surgeon: Jinny Carmine, MD;  Location: [facility];  Service: Endoscopy;;    There were no vitals filed for this visit.   Subjective Assessment - 07/23/23 1338     Subjective  I am doing well  - swelling went down and my motion good- little tight over head- having my radiation simulation today -    Currently in Pain? No/denies                  LYMPHEDEMA/ONCOLOGY QUESTIONNAIRE - 07/23/23 0001       Right Upper Extremity Lymphedema   10 cm Proximal to Olecranon Process 30.5 cm    Olecranon Process 25.5 cm    15 cm Proximal to Ulnar Styloid Process 25 cm    Just Proximal to Ulnar Styloid Process 16.5 cm      Left Upper Extremity Lymphedema   10 cm Proximal to Olecranon Process 30.5 cm    Olecranon Process 26.5 cm    15 cm Proximal to Ulnar Styloid Process 26 cm    Just Proximal to Ulnar Styloid Process 17 cm              Dr Rodolph 07/08/23 Assessment & Plan Postoperative Evaluation after Partial Mastectomy for Ductal Carcinoma In Situ (DCIS) Postoperative evaluation after partial mastectomy of the right breast for DCIS shows residual intermediate-grade DCIS with negative margins, the closest being 9 mm. Healing is progressing well with expected swelling and seroma, no infection, and improving bruising. Physical exam reveals resolving redness and bruising, with expected seroma. There are no contraindications for radiation therapy. Medical oncology recommends adjuvant radiation and endocrine therapy. Radiation therapy is planned for 5 days a week over 4 weeks, totaling 21  sessions. Supportive bra and ice packs are advised for soreness and swelling. Proceed with adjuvant radiation and initiate endocrine therapy as recommended. Use hydropride and apply ice packs at night or when at home. Schedule a follow-up in 8 weeks to monitor healing during radiation therapy. Contact if there are any concerns about the incision.    OT SCREEN 07/23/23: Patient present at OT about 4 weeks post right partial mastectomy. Patient starting today radiation. Patient with endrange tightness in shoulder flexion abduction as well as external rotation. Provided patient with active assisted range of motion home exercises on wall for shoulder flexion abduction and in supine for external rotation 10 reps 1-2 times a day As a  circumference of bilateral upper extremities of baseline.-Patient risk for lymphedema low. Patient can do gentle soft tissue massage to fibrosis and scar tissue prior to start of radiation. Pain-free. Patient is left-hand dominant. 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 No need for OT intervention or follow-up. Patient can contact me as needed.                                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 [facility] [address] [place], [place], [address] Phone: [phone]   Fax:  [phone]  Name: Nichole Gordon MRN: [id number] Date of Birth: 06-21-1965

## 2023-07-24 ENCOUNTER — Ambulatory Visit: Payer: 59

## 2023-07-25 ENCOUNTER — Ambulatory Visit: Payer: 59

## 2023-07-28 ENCOUNTER — Other Ambulatory Visit: Payer: Self-pay

## 2023-07-28 ENCOUNTER — Ambulatory Visit
Admission: RE | Admit: 2023-07-28 | Discharge: 2023-07-28 | Disposition: A | Discharge status: Home / Self Care | Payer: 59 | Source: Ambulatory Visit | Attending: Radiation Oncology | Admitting: Radiation Oncology

## 2023-07-28 DIAGNOSIS — Z170 Estrogen receptor positive status [ER+]: Secondary | ICD-10-CM | POA: Diagnosis not present

## 2023-07-28 DIAGNOSIS — Z510 Encounter for antineoplastic radiation therapy: Secondary | ICD-10-CM | POA: Diagnosis not present

## 2023-07-28 DIAGNOSIS — D0511 Intraductal carcinoma in situ of right breast: Secondary | ICD-10-CM | POA: Diagnosis not present

## 2023-07-28 LAB — RAD ONC ARIA SESSION SUMMARY
Course Elapsed Days: 0
Plan Fractions Treated to Date: 1
Plan Prescribed Dose Per Fraction: 2.66 Gy
Plan Total Fractions Prescribed: 16
Plan Total Prescribed Dose: 42.56 Gy
Reference Point Dosage Given to Date: 2.66 Gy
Reference Point Session Dosage Given: 2.66 Gy
Session Number: 1

## 2023-07-29 ENCOUNTER — Other Ambulatory Visit: Payer: Self-pay

## 2023-07-29 ENCOUNTER — Ambulatory Visit
Admission: RE | Admit: 2023-07-29 | Discharge: 2023-07-29 | Disposition: A | Discharge status: Home / Self Care | Payer: 59 | Source: Ambulatory Visit | Attending: Radiation Oncology | Admitting: Radiation Oncology

## 2023-07-29 DIAGNOSIS — Z170 Estrogen receptor positive status [ER+]: Secondary | ICD-10-CM | POA: Diagnosis not present

## 2023-07-29 DIAGNOSIS — Z510 Encounter for antineoplastic radiation therapy: Secondary | ICD-10-CM | POA: Diagnosis not present

## 2023-07-29 DIAGNOSIS — D0511 Intraductal carcinoma in situ of right breast: Secondary | ICD-10-CM | POA: Diagnosis not present

## 2023-07-29 LAB — RAD ONC ARIA SESSION SUMMARY
Course Elapsed Days: 1
Plan Fractions Treated to Date: 2
Plan Prescribed Dose Per Fraction: 2.66 Gy
Plan Total Fractions Prescribed: 16
Plan Total Prescribed Dose: 42.56 Gy
Reference Point Dosage Given to Date: 5.32 Gy
Reference Point Session Dosage Given: 2.66 Gy
Session Number: 2

## 2023-07-30 ENCOUNTER — Other Ambulatory Visit: Payer: Self-pay

## 2023-07-30 ENCOUNTER — Ambulatory Visit
Admission: RE | Admit: 2023-07-30 | Discharge: 2023-07-30 | Disposition: A | Discharge status: Home / Self Care | Payer: 59 | Source: Ambulatory Visit | Attending: Radiation Oncology | Admitting: Radiation Oncology

## 2023-07-30 DIAGNOSIS — D0511 Intraductal carcinoma in situ of right breast: Secondary | ICD-10-CM | POA: Diagnosis not present

## 2023-07-30 DIAGNOSIS — Z170 Estrogen receptor positive status [ER+]: Secondary | ICD-10-CM | POA: Diagnosis not present

## 2023-07-30 DIAGNOSIS — Z510 Encounter for antineoplastic radiation therapy: Secondary | ICD-10-CM | POA: Diagnosis not present

## 2023-07-30 LAB — RAD ONC ARIA SESSION SUMMARY
Course Elapsed Days: 2
Plan Fractions Treated to Date: 3
Plan Prescribed Dose Per Fraction: 2.66 Gy
Plan Total Fractions Prescribed: 16
Plan Total Prescribed Dose: 42.56 Gy
Reference Point Dosage Given to Date: 7.98 Gy
Reference Point Session Dosage Given: 2.66 Gy
Session Number: 3

## 2023-07-31 ENCOUNTER — Inpatient Hospital Stay: Payer: 59

## 2023-07-31 ENCOUNTER — Ambulatory Visit
Admission: RE | Admit: 2023-07-31 | Discharge: 2023-07-31 | Disposition: A | Discharge status: Home / Self Care | Payer: 59 | Source: Ambulatory Visit | Attending: Radiation Oncology | Admitting: Radiation Oncology

## 2023-07-31 ENCOUNTER — Other Ambulatory Visit: Payer: Self-pay

## 2023-07-31 DIAGNOSIS — D0511 Intraductal carcinoma in situ of right breast: Secondary | ICD-10-CM | POA: Diagnosis not present

## 2023-07-31 DIAGNOSIS — Z510 Encounter for antineoplastic radiation therapy: Secondary | ICD-10-CM | POA: Diagnosis not present

## 2023-07-31 DIAGNOSIS — Z170 Estrogen receptor positive status [ER+]: Secondary | ICD-10-CM | POA: Diagnosis not present

## 2023-07-31 LAB — CBC (CANCER CENTER ONLY)
HCT: 42.5 % (ref 36.0–46.0)
Hemoglobin: 14 g/dL (ref 12.0–15.0)
MCH: 29.9 pg (ref 26.0–34.0)
MCHC: 32.9 g/dL (ref 30.0–36.0)
MCV: 90.8 fL (ref 80.0–100.0)
Platelet Count: 252 10*3/uL (ref 150–400)
RBC: 4.68 MIL/uL (ref 3.87–5.11)
RDW: 12.5 % (ref 11.5–15.5)
WBC Count: 8.2 10*3/uL (ref 4.0–10.5)
nRBC: 0 % (ref 0.0–0.2)

## 2023-07-31 LAB — RAD ONC ARIA SESSION SUMMARY
Course Elapsed Days: 3
Plan Fractions Treated to Date: 4
Plan Prescribed Dose Per Fraction: 2.66 Gy
Plan Total Fractions Prescribed: 16
Plan Total Prescribed Dose: 42.56 Gy
Reference Point Dosage Given to Date: 10.64 Gy
Reference Point Session Dosage Given: 2.66 Gy
Session Number: 4

## 2023-08-01 ENCOUNTER — Ambulatory Visit
Admission: RE | Admit: 2023-08-01 | Discharge: 2023-08-01 | Disposition: A | Discharge status: Home / Self Care | Payer: 59 | Source: Ambulatory Visit | Attending: Radiation Oncology | Admitting: Radiation Oncology

## 2023-08-01 ENCOUNTER — Other Ambulatory Visit: Payer: Self-pay

## 2023-08-01 DIAGNOSIS — Z170 Estrogen receptor positive status [ER+]: Secondary | ICD-10-CM | POA: Diagnosis not present

## 2023-08-01 DIAGNOSIS — Z510 Encounter for antineoplastic radiation therapy: Secondary | ICD-10-CM | POA: Diagnosis not present

## 2023-08-01 DIAGNOSIS — D0511 Intraductal carcinoma in situ of right breast: Secondary | ICD-10-CM | POA: Diagnosis not present

## 2023-08-01 LAB — RAD ONC ARIA SESSION SUMMARY
Course Elapsed Days: 4
Plan Fractions Treated to Date: 5
Plan Prescribed Dose Per Fraction: 2.66 Gy
Plan Total Fractions Prescribed: 16
Plan Total Prescribed Dose: 42.56 Gy
Reference Point Dosage Given to Date: 13.3 Gy
Reference Point Session Dosage Given: 2.66 Gy
Session Number: 5

## 2023-08-04 ENCOUNTER — Ambulatory Visit
Admission: RE | Admit: 2023-08-04 | Discharge: 2023-08-04 | Disposition: A | Discharge status: Home / Self Care | Payer: 59 | Source: Ambulatory Visit | Attending: Radiation Oncology | Admitting: Radiation Oncology

## 2023-08-04 ENCOUNTER — Other Ambulatory Visit: Payer: Self-pay

## 2023-08-04 DIAGNOSIS — Z510 Encounter for antineoplastic radiation therapy: Secondary | ICD-10-CM | POA: Diagnosis not present

## 2023-08-04 DIAGNOSIS — D0511 Intraductal carcinoma in situ of right breast: Secondary | ICD-10-CM | POA: Diagnosis not present

## 2023-08-04 DIAGNOSIS — Z170 Estrogen receptor positive status [ER+]: Secondary | ICD-10-CM | POA: Diagnosis not present

## 2023-08-04 LAB — RAD ONC ARIA SESSION SUMMARY
Course Elapsed Days: 7
Plan Fractions Treated to Date: 6
Plan Prescribed Dose Per Fraction: 2.66 Gy
Plan Total Fractions Prescribed: 16
Plan Total Prescribed Dose: 42.56 Gy
Reference Point Dosage Given to Date: 15.96 Gy
Reference Point Session Dosage Given: 2.66 Gy
Session Number: 6

## 2023-08-05 ENCOUNTER — Ambulatory Visit
Admission: RE | Admit: 2023-08-05 | Discharge: 2023-08-05 | Disposition: A | Discharge status: Home / Self Care | Payer: 59 | Source: Ambulatory Visit | Attending: Radiation Oncology | Admitting: Radiation Oncology

## 2023-08-05 ENCOUNTER — Other Ambulatory Visit: Payer: Self-pay

## 2023-08-05 DIAGNOSIS — D0511 Intraductal carcinoma in situ of right breast: Secondary | ICD-10-CM | POA: Diagnosis not present

## 2023-08-05 DIAGNOSIS — Z510 Encounter for antineoplastic radiation therapy: Secondary | ICD-10-CM | POA: Diagnosis not present

## 2023-08-05 DIAGNOSIS — Z170 Estrogen receptor positive status [ER+]: Secondary | ICD-10-CM | POA: Diagnosis not present

## 2023-08-05 LAB — RAD ONC ARIA SESSION SUMMARY
Course Elapsed Days: 8
Plan Fractions Treated to Date: 7
Plan Prescribed Dose Per Fraction: 2.66 Gy
Plan Total Fractions Prescribed: 16
Plan Total Prescribed Dose: 42.56 Gy
Reference Point Dosage Given to Date: 18.62 Gy
Reference Point Session Dosage Given: 2.66 Gy
Session Number: 7

## 2023-08-06 ENCOUNTER — Other Ambulatory Visit: Payer: Self-pay

## 2023-08-06 ENCOUNTER — Ambulatory Visit: Payer: 59

## 2023-08-06 ENCOUNTER — Ambulatory Visit
Admission: RE | Admit: 2023-08-06 | Discharge: 2023-08-06 | Disposition: A | Discharge status: Home / Self Care | Payer: 59 | Source: Ambulatory Visit | Attending: Radiation Oncology | Admitting: Radiation Oncology

## 2023-08-06 DIAGNOSIS — D0511 Intraductal carcinoma in situ of right breast: Secondary | ICD-10-CM | POA: Diagnosis not present

## 2023-08-06 DIAGNOSIS — Z170 Estrogen receptor positive status [ER+]: Secondary | ICD-10-CM | POA: Diagnosis not present

## 2023-08-06 DIAGNOSIS — Z510 Encounter for antineoplastic radiation therapy: Secondary | ICD-10-CM | POA: Diagnosis not present

## 2023-08-06 LAB — RAD ONC ARIA SESSION SUMMARY
Course Elapsed Days: 9
Plan Fractions Treated to Date: 8
Plan Prescribed Dose Per Fraction: 2.66 Gy
Plan Total Fractions Prescribed: 16
Plan Total Prescribed Dose: 42.56 Gy
Reference Point Dosage Given to Date: 21.28 Gy
Reference Point Session Dosage Given: 2.66 Gy
Session Number: 8

## 2023-08-07 ENCOUNTER — Other Ambulatory Visit: Payer: Self-pay

## 2023-08-07 ENCOUNTER — Ambulatory Visit
Admission: RE | Admit: 2023-08-07 | Discharge: 2023-08-07 | Disposition: A | Discharge status: Home / Self Care | Payer: 59 | Source: Ambulatory Visit | Attending: Radiation Oncology | Admitting: Radiation Oncology

## 2023-08-07 DIAGNOSIS — D0511 Intraductal carcinoma in situ of right breast: Secondary | ICD-10-CM | POA: Diagnosis not present

## 2023-08-07 DIAGNOSIS — Z170 Estrogen receptor positive status [ER+]: Secondary | ICD-10-CM | POA: Diagnosis not present

## 2023-08-07 DIAGNOSIS — Z510 Encounter for antineoplastic radiation therapy: Secondary | ICD-10-CM | POA: Diagnosis not present

## 2023-08-07 LAB — RAD ONC ARIA SESSION SUMMARY
Course Elapsed Days: 10
Plan Fractions Treated to Date: 9
Plan Prescribed Dose Per Fraction: 2.66 Gy
Plan Total Fractions Prescribed: 16
Plan Total Prescribed Dose: 42.56 Gy
Reference Point Dosage Given to Date: 23.94 Gy
Reference Point Session Dosage Given: 2.66 Gy
Session Number: 9

## 2023-08-08 ENCOUNTER — Ambulatory Visit
Admission: RE | Admit: 2023-08-08 | Discharge: 2023-08-08 | Disposition: A | Discharge status: Home / Self Care | Payer: 59 | Source: Ambulatory Visit | Attending: Radiation Oncology | Admitting: Radiation Oncology

## 2023-08-08 ENCOUNTER — Other Ambulatory Visit: Payer: Self-pay

## 2023-08-08 DIAGNOSIS — Z510 Encounter for antineoplastic radiation therapy: Secondary | ICD-10-CM | POA: Diagnosis not present

## 2023-08-08 DIAGNOSIS — D0511 Intraductal carcinoma in situ of right breast: Secondary | ICD-10-CM | POA: Diagnosis not present

## 2023-08-08 DIAGNOSIS — Z170 Estrogen receptor positive status [ER+]: Secondary | ICD-10-CM | POA: Diagnosis not present

## 2023-08-08 LAB — RAD ONC ARIA SESSION SUMMARY
Course Elapsed Days: 11
Plan Fractions Treated to Date: 10
Plan Prescribed Dose Per Fraction: 2.66 Gy
Plan Total Fractions Prescribed: 16
Plan Total Prescribed Dose: 42.56 Gy
Reference Point Dosage Given to Date: 26.6 Gy
Reference Point Session Dosage Given: 2.66 Gy
Session Number: 10

## 2023-08-11 ENCOUNTER — Ambulatory Visit
Admission: RE | Admit: 2023-08-11 | Discharge: 2023-08-11 | Disposition: A | Discharge status: Home / Self Care | Payer: 59 | Source: Ambulatory Visit | Attending: Radiation Oncology | Admitting: Radiation Oncology

## 2023-08-11 ENCOUNTER — Other Ambulatory Visit: Payer: Self-pay

## 2023-08-11 DIAGNOSIS — Z170 Estrogen receptor positive status [ER+]: Secondary | ICD-10-CM | POA: Diagnosis not present

## 2023-08-11 DIAGNOSIS — D0511 Intraductal carcinoma in situ of right breast: Secondary | ICD-10-CM | POA: Diagnosis not present

## 2023-08-11 DIAGNOSIS — Z510 Encounter for antineoplastic radiation therapy: Secondary | ICD-10-CM | POA: Diagnosis not present

## 2023-08-11 LAB — RAD ONC ARIA SESSION SUMMARY
Course Elapsed Days: 14
Plan Fractions Treated to Date: 11
Plan Prescribed Dose Per Fraction: 2.66 Gy
Plan Total Fractions Prescribed: 16
Plan Total Prescribed Dose: 42.56 Gy
Reference Point Dosage Given to Date: 29.26 Gy
Reference Point Session Dosage Given: 2.66 Gy
Session Number: 11

## 2023-08-12 ENCOUNTER — Ambulatory Visit
Admission: RE | Admit: 2023-08-12 | Discharge: 2023-08-12 | Disposition: A | Discharge status: Home / Self Care | Payer: 59 | Source: Ambulatory Visit | Attending: Radiation Oncology | Admitting: Radiation Oncology

## 2023-08-12 ENCOUNTER — Other Ambulatory Visit: Payer: Self-pay

## 2023-08-12 DIAGNOSIS — D0511 Intraductal carcinoma in situ of right breast: Secondary | ICD-10-CM | POA: Diagnosis not present

## 2023-08-12 DIAGNOSIS — Z510 Encounter for antineoplastic radiation therapy: Secondary | ICD-10-CM | POA: Diagnosis not present

## 2023-08-12 DIAGNOSIS — Z170 Estrogen receptor positive status [ER+]: Secondary | ICD-10-CM | POA: Diagnosis not present

## 2023-08-12 LAB — RAD ONC ARIA SESSION SUMMARY
Course Elapsed Days: 15
Plan Fractions Treated to Date: 12
Plan Prescribed Dose Per Fraction: 2.66 Gy
Plan Total Fractions Prescribed: 16
Plan Total Prescribed Dose: 42.56 Gy
Reference Point Dosage Given to Date: 31.92 Gy
Reference Point Session Dosage Given: 2.66 Gy
Session Number: 12

## 2023-08-13 ENCOUNTER — Other Ambulatory Visit: Payer: Self-pay

## 2023-08-13 ENCOUNTER — Ambulatory Visit
Admission: RE | Admit: 2023-08-13 | Discharge: 2023-08-13 | Disposition: A | Discharge status: Home / Self Care | Payer: 59 | Source: Ambulatory Visit | Attending: Radiation Oncology | Admitting: Radiation Oncology

## 2023-08-13 DIAGNOSIS — D0511 Intraductal carcinoma in situ of right breast: Secondary | ICD-10-CM | POA: Diagnosis not present

## 2023-08-13 DIAGNOSIS — Z170 Estrogen receptor positive status [ER+]: Secondary | ICD-10-CM | POA: Diagnosis not present

## 2023-08-13 DIAGNOSIS — Z510 Encounter for antineoplastic radiation therapy: Secondary | ICD-10-CM | POA: Diagnosis not present

## 2023-08-13 LAB — RAD ONC ARIA SESSION SUMMARY
Course Elapsed Days: 16
Plan Fractions Treated to Date: 13
Plan Prescribed Dose Per Fraction: 2.66 Gy
Plan Total Fractions Prescribed: 16
Plan Total Prescribed Dose: 42.56 Gy
Reference Point Dosage Given to Date: 34.58 Gy
Reference Point Session Dosage Given: 2.66 Gy
Session Number: 13

## 2023-08-14 ENCOUNTER — Ambulatory Visit
Admission: RE | Admit: 2023-08-14 | Discharge: 2023-08-14 | Disposition: A | Discharge status: Home / Self Care | Payer: 59 | Source: Ambulatory Visit | Attending: Radiation Oncology | Admitting: Radiation Oncology

## 2023-08-14 ENCOUNTER — Other Ambulatory Visit: Payer: Self-pay

## 2023-08-14 ENCOUNTER — Inpatient Hospital Stay: Payer: 59

## 2023-08-14 DIAGNOSIS — D0511 Intraductal carcinoma in situ of right breast: Secondary | ICD-10-CM

## 2023-08-14 DIAGNOSIS — Z510 Encounter for antineoplastic radiation therapy: Secondary | ICD-10-CM | POA: Diagnosis not present

## 2023-08-14 DIAGNOSIS — Z170 Estrogen receptor positive status [ER+]: Secondary | ICD-10-CM | POA: Diagnosis not present

## 2023-08-14 LAB — RAD ONC ARIA SESSION SUMMARY
Course Elapsed Days: 17
Plan Fractions Treated to Date: 14
Plan Prescribed Dose Per Fraction: 2.66 Gy
Plan Total Fractions Prescribed: 16
Plan Total Prescribed Dose: 42.56 Gy
Reference Point Dosage Given to Date: 37.24 Gy
Reference Point Session Dosage Given: 2.66 Gy
Session Number: 14

## 2023-08-14 LAB — CBC (CANCER CENTER ONLY)
HCT: 42.8 % (ref 36.0–46.0)
Hemoglobin: 14.3 g/dL (ref 12.0–15.0)
MCH: 30.2 pg (ref 26.0–34.0)
MCHC: 33.4 g/dL (ref 30.0–36.0)
MCV: 90.5 fL (ref 80.0–100.0)
Platelet Count: 231 10*3/uL (ref 150–400)
RBC: 4.73 MIL/uL (ref 3.87–5.11)
RDW: 12.6 % (ref 11.5–15.5)
WBC Count: 6.9 10*3/uL (ref 4.0–10.5)
nRBC: 0 % (ref 0.0–0.2)

## 2023-08-15 ENCOUNTER — Ambulatory Visit
Admission: RE | Admit: 2023-08-15 | Discharge: 2023-08-15 | Disposition: A | Discharge status: Home / Self Care | Payer: 59 | Source: Ambulatory Visit | Attending: Radiation Oncology | Admitting: Radiation Oncology

## 2023-08-15 ENCOUNTER — Other Ambulatory Visit: Payer: Self-pay

## 2023-08-15 DIAGNOSIS — Z510 Encounter for antineoplastic radiation therapy: Secondary | ICD-10-CM | POA: Diagnosis not present

## 2023-08-15 DIAGNOSIS — D0511 Intraductal carcinoma in situ of right breast: Secondary | ICD-10-CM | POA: Diagnosis not present

## 2023-08-15 DIAGNOSIS — Z170 Estrogen receptor positive status [ER+]: Secondary | ICD-10-CM | POA: Diagnosis not present

## 2023-08-15 LAB — RAD ONC ARIA SESSION SUMMARY
Course Elapsed Days: 18
Plan Fractions Treated to Date: 15
Plan Prescribed Dose Per Fraction: 2.66 Gy
Plan Total Fractions Prescribed: 16
Plan Total Prescribed Dose: 42.56 Gy
Reference Point Dosage Given to Date: 39.9 Gy
Reference Point Session Dosage Given: 2.66 Gy
Session Number: 15

## 2023-08-18 ENCOUNTER — Ambulatory Visit
Admission: RE | Admit: 2023-08-18 | Discharge: 2023-08-18 | Disposition: A | Discharge status: Home / Self Care | Payer: 59 | Source: Ambulatory Visit | Attending: Oncology | Admitting: Oncology

## 2023-08-18 ENCOUNTER — Ambulatory Visit
Admission: RE | Admit: 2023-08-18 | Discharge: 2023-08-18 | Disposition: A | Discharge status: Home / Self Care | Payer: 59 | Source: Ambulatory Visit | Attending: Radiation Oncology | Admitting: Radiation Oncology

## 2023-08-18 ENCOUNTER — Other Ambulatory Visit: Payer: Self-pay

## 2023-08-18 DIAGNOSIS — Z170 Estrogen receptor positive status [ER+]: Secondary | ICD-10-CM | POA: Diagnosis not present

## 2023-08-18 DIAGNOSIS — Z780 Asymptomatic menopausal state: Secondary | ICD-10-CM | POA: Diagnosis not present

## 2023-08-18 DIAGNOSIS — D0511 Intraductal carcinoma in situ of right breast: Secondary | ICD-10-CM | POA: Insufficient documentation

## 2023-08-18 LAB — RAD ONC ARIA SESSION SUMMARY
Course Elapsed Days: 21
Plan Fractions Treated to Date: 16
Plan Prescribed Dose Per Fraction: 2.66 Gy
Plan Total Fractions Prescribed: 16
Plan Total Prescribed Dose: 42.56 Gy
Reference Point Dosage Given to Date: 42.56 Gy
Reference Point Session Dosage Given: 2.66 Gy
Session Number: 16

## 2023-08-19 ENCOUNTER — Ambulatory Visit
Admission: RE | Admit: 2023-08-19 | Discharge: 2023-08-19 | Disposition: A | Discharge status: Home / Self Care | Payer: 59 | Source: Ambulatory Visit | Attending: Radiation Oncology | Admitting: Radiation Oncology

## 2023-08-19 ENCOUNTER — Other Ambulatory Visit: Payer: Self-pay

## 2023-08-19 DIAGNOSIS — D0511 Intraductal carcinoma in situ of right breast: Secondary | ICD-10-CM | POA: Diagnosis not present

## 2023-08-19 LAB — RAD ONC ARIA SESSION SUMMARY
Course Elapsed Days: 22
Plan Fractions Treated to Date: 1
Plan Prescribed Dose Per Fraction: 2 Gy
Plan Total Fractions Prescribed: 5
Plan Total Prescribed Dose: 10 Gy
Reference Point Dosage Given to Date: 2 Gy
Reference Point Session Dosage Given: 2 Gy
Session Number: 17

## 2023-08-20 ENCOUNTER — Other Ambulatory Visit: Payer: Self-pay

## 2023-08-20 ENCOUNTER — Ambulatory Visit
Admission: RE | Admit: 2023-08-20 | Discharge: 2023-08-20 | Disposition: A | Discharge status: Home / Self Care | Payer: 59 | Source: Ambulatory Visit | Attending: Radiation Oncology | Admitting: Radiation Oncology

## 2023-08-20 DIAGNOSIS — D0511 Intraductal carcinoma in situ of right breast: Secondary | ICD-10-CM | POA: Diagnosis not present

## 2023-08-20 LAB — RAD ONC ARIA SESSION SUMMARY
Course Elapsed Days: 23
Plan Fractions Treated to Date: 2
Plan Prescribed Dose Per Fraction: 2 Gy
Plan Total Fractions Prescribed: 5
Plan Total Prescribed Dose: 10 Gy
Reference Point Dosage Given to Date: 4 Gy
Reference Point Session Dosage Given: 2 Gy
Session Number: 18

## 2023-08-21 ENCOUNTER — Ambulatory Visit
Admission: RE | Admit: 2023-08-21 | Discharge: 2023-08-21 | Disposition: A | Discharge status: Home / Self Care | Payer: 59 | Source: Ambulatory Visit | Attending: Radiation Oncology | Admitting: Radiation Oncology

## 2023-08-21 ENCOUNTER — Other Ambulatory Visit: Payer: Self-pay

## 2023-08-21 DIAGNOSIS — D0511 Intraductal carcinoma in situ of right breast: Secondary | ICD-10-CM | POA: Diagnosis not present

## 2023-08-21 LAB — RAD ONC ARIA SESSION SUMMARY
Course Elapsed Days: 24
Plan Fractions Treated to Date: 3
Plan Prescribed Dose Per Fraction: 2 Gy
Plan Total Fractions Prescribed: 5
Plan Total Prescribed Dose: 10 Gy
Reference Point Dosage Given to Date: 6 Gy
Reference Point Session Dosage Given: 2 Gy
Session Number: 19

## 2023-08-22 ENCOUNTER — Ambulatory Visit
Admission: RE | Admit: 2023-08-22 | Discharge: 2023-08-22 | Disposition: A | Discharge status: Home / Self Care | Payer: 59 | Source: Ambulatory Visit | Attending: Radiation Oncology | Admitting: Radiation Oncology

## 2023-08-22 ENCOUNTER — Other Ambulatory Visit: Payer: Self-pay

## 2023-08-22 DIAGNOSIS — D0511 Intraductal carcinoma in situ of right breast: Secondary | ICD-10-CM | POA: Diagnosis not present

## 2023-08-22 LAB — RAD ONC ARIA SESSION SUMMARY
Course Elapsed Days: 25
Plan Fractions Treated to Date: 4
Plan Prescribed Dose Per Fraction: 2 Gy
Plan Total Fractions Prescribed: 5
Plan Total Prescribed Dose: 10 Gy
Reference Point Dosage Given to Date: 8 Gy
Reference Point Session Dosage Given: 2 Gy
Session Number: 20

## 2023-08-25 ENCOUNTER — Encounter: Payer: Self-pay | Admitting: *Deleted

## 2023-08-25 ENCOUNTER — Other Ambulatory Visit: Payer: Self-pay

## 2023-08-25 ENCOUNTER — Ambulatory Visit
Admission: RE | Admit: 2023-08-25 | Discharge: 2023-08-25 | Disposition: A | Discharge status: Home / Self Care | Payer: 59 | Source: Ambulatory Visit | Attending: Radiation Oncology | Admitting: Radiation Oncology

## 2023-08-25 DIAGNOSIS — D0511 Intraductal carcinoma in situ of right breast: Secondary | ICD-10-CM | POA: Diagnosis not present

## 2023-08-25 DIAGNOSIS — Z510 Encounter for antineoplastic radiation therapy: Secondary | ICD-10-CM | POA: Diagnosis not present

## 2023-08-25 DIAGNOSIS — Z170 Estrogen receptor positive status [ER+]: Secondary | ICD-10-CM | POA: Diagnosis not present

## 2023-08-25 LAB — RAD ONC ARIA SESSION SUMMARY
Course Elapsed Days: 28
Plan Fractions Treated to Date: 5
Plan Prescribed Dose Per Fraction: 2 Gy
Plan Total Fractions Prescribed: 5
Plan Total Prescribed Dose: 10 Gy
Reference Point Dosage Given to Date: 10 Gy
Reference Point Session Dosage Given: 2 Gy
Session Number: 21

## 2023-08-26 NOTE — Radiation Completion Notes (Signed)
 Patient Name: Nichole Gordon, Nichole Gordon MRN: [id number] Date of Birth: 1966-04-10 Referring Physician: Olevia Perches, M.D. Date of Service: 2023-08-26 Radiation Oncologist: Carmina Miller, M.D. [facility]                             RADIATION ONCOLOGY END OF TREATMENT NOTE     Diagnosis: D05.11 Intraductal carcinoma in situ of right breast Staging on 2023-07-08: Ductal carcinoma in situ (DCIS) of right breast T=cTis (DCIS), N=cN0, M=cM0 Intent: Curative     HPI: Patient is a 58 year old female who presents with an abnormal mammogram of her right breast.  There is a 3 mm group of amorphous microcalcifications in the upper outer right breast new from previous exam.  She underwent ultrasound-guided biopsy which was positive for ductal carcinoma in situ.  She went on to have a wide local excision for a 4 mm area of grade 2 ductal carcinoma in situ with expansive comedonecrosis.  All margins were clear closest margin at 9 mm no regional lymph nodes were submitted.  Tumor was strongly ER positive.  She has done well postoperatively.  She had BRCA testing which was negative.  She is seen today for consideration of radiation therapy she is doing well.  She specifically denies breast tenderness cough or bone pain.      ==========DELIVERED PLANS==========  First Treatment Date: 2023-07-28 Last Treatment Date: 2023-08-25   Plan Name: Breast_R Site: Breast, Right Technique: 3D Mode: Photon Dose Per Fraction: 2.66 Gy Prescribed Dose (Delivered / Prescribed): 42.56 Gy / 42.56 Gy Prescribed Fxs (Delivered / Prescribed): 16 / 16   Plan Name: Breast_R_Bst Site: Breast, Right Technique: 3D Mode: Photon Dose Per Fraction: 2 Gy Prescribed Dose (Delivered / Prescribed): 10 Gy / 10 Gy Prescribed Fxs (Delivered / Prescribed): 5 / 5     ==========ON TREATMENT VISIT DATES========== 2023-07-29, 2023-08-05, 2023-08-12, 2023-08-19     ==========UPCOMING  VISITS========== 10/17/2023 [facility] PRACTICE OFFICE VISIT Dorcas Carrow, DO  10/10/2023 [facility] EST PT 15 Creig Hines, MD  10/10/2023 [facility] LAB [facility]  09/24/2023 [facility] ONCOLOGY FOLLOW UP 30 Chrystal, Sherrine Maples, MD        ==========APPENDIX - ON TREATMENT VISIT NOTES==========   See weekly On Treatment Notes in Epic for details in the Media tab (listed as Progress notes on the On Treatment Visit Dates listed above).

## 2023-08-28 ENCOUNTER — Other Ambulatory Visit: Payer: Self-pay | Admitting: Family Medicine

## 2023-08-28 NOTE — Telephone Encounter (Signed)
 Requested medication (s) are due for refill today: Yes  Requested medication (s) are on the active medication list: Yes  Last refill:  03/03/23  Future visit scheduled: Yes  Notes to clinic:  Manual review.    Requested Prescriptions  Pending Prescriptions Disp Refills   naproxen (NAPROSYN) 500 MG tablet [Pharmacy Med Name: NAPROXEN 500 MG TABLET] 180 tablet 1    Sig: TAKE 1 TABLET BY MOUTH 2 TIMES DAILY WITH A MEAL.     Analgesics:  NSAIDS Failed - 08/28/2023  1:10 PM      Failed - Manual Review: Labs are only required if the patient has taken medication for more than 8 weeks.      Passed - Cr in normal range and within 360 days    Creat  Date Value Ref Range Status  02/23/2013 0.53 0.50 - 1.10 mg/dL Final   Creatinine, Ser  Date Value Ref Range Status  04/21/2023 0.83 0.57 - 1.00 mg/dL Final         Passed - HGB in normal range and within 360 days    Hemoglobin  Date Value Ref Range Status  08/14/2023 14.3 12.0 - 15.0 g/dL Final  13/01/6577 46.9 11.1 - 15.9 g/dL Final         Passed - PLT in normal range and within 360 days    Platelets  Date Value Ref Range Status  03/03/2023 237 150 - 450 x10E3/uL Final   Platelet Count  Date Value Ref Range Status  08/14/2023 231 150 - 400 K/uL Final         Passed - HCT in normal range and within 360 days    HCT  Date Value Ref Range Status  08/14/2023 42.8 36.0 - 46.0 % Final   Hematocrit  Date Value Ref Range Status  03/03/2023 40.2 34.0 - 46.6 % Final         Passed - eGFR is 30 or above and within 360 days    GFR calc Af Amer  Date Value Ref Range Status  07/18/2020 118 >59 mL/min/1.73 Final    Comment:    **In accordance with recommendations from the NKF-ASN Task force,**   [facility] is in the process of updating its eGFR calculation to the   2021 CKD-EPI creatinine equation that estimates kidney function   without a race variable.    GFR calc non Af Amer  Date Value Ref Range Status  07/18/2020 103 >59  mL/min/1.73 Final   eGFR  Date Value Ref Range Status  04/21/2023 81 >59 mL/min/1.73 Final         Passed - Patient is not pregnant      Passed - Valid encounter within last 12 months    Recent Outpatient Visits           4 months ago Primary hypertension   [place] [facility] [place], Megan P, DO   5 months ago Routine general medical examination at a health care facility   [facility] [place], [place] P, DO   1 year ago Primary hypertension   [place] [facility] [place], [place] P, DO   1 year ago Routine general medical examination at a health care facility   [facility] [place], [place] P, DO   2 years ago Primary hypertension   [place] [facility] Dorcas Carrow, DO       Future Appointments  In 1 month Johnson, Oralia Rud, DO [place] [facility]

## 2023-08-29 ENCOUNTER — Other Ambulatory Visit: Payer: Self-pay | Admitting: Family Medicine

## 2023-08-29 NOTE — Telephone Encounter (Signed)
 Requested Prescriptions  Pending Prescriptions Disp Refills   levothyroxine (SYNTHROID) 25 MCG tablet [Pharmacy Med Name: LEVOTHYROXINE 25 MCG TABLET] 90 tablet 1    Sig: TAKE 1 TABLET BY MOUTH EVERY DAY     Endocrinology:  Hypothyroid Agents Passed - 08/29/2023  2:35 PM      Passed - TSH in normal range and within 360 days    TSH  Date Value Ref Range Status  03/03/2023 1.240 0.450 - 4.500 uIU/mL Final         Passed - Valid encounter within last 12 months    Recent Outpatient Visits           4 months ago Primary hypertension   [place] [facility] [place], Megan P, DO   5 months ago Routine general medical examination at a health care facility   [facility] [place], [place] P, DO   1 year ago Primary hypertension   [place] [facility] [place], [place] P, DO   1 year ago Routine general medical examination at a health care facility   [facility] [place], [place] P, DO   2 years ago Primary hypertension   [place] [facility] [place], Oralia Rud, DO       Future Appointments             In 1 month Laural Benes, Oralia Rud, DO  [facility], [facility]

## 2023-08-30 ENCOUNTER — Other Ambulatory Visit: Payer: Self-pay | Admitting: Family Medicine

## 2023-09-01 NOTE — Telephone Encounter (Signed)
 Requested Prescriptions  Pending Prescriptions Disp Refills   escitalopram (LEXAPRO) 20 MG tablet [Pharmacy Med Name: ESCITALOPRAM 20 MG TABLET] 90 tablet 0    Sig: TAKE 1 TABLET BY MOUTH EVERY DAY     Psychiatry:  Antidepressants - SSRI Passed - 09/01/2023  1:39 PM      Passed - Valid encounter within last 6 months    Recent Outpatient Visits           4 months ago Primary hypertension   [place] [facility] [place], Megan P, DO   6 months ago Routine general medical examination at a health care facility   [facility] [place], [place] P, DO   1 year ago Primary hypertension   [place] [facility] [place], [place] P, DO   1 year ago Routine general medical examination at a health care facility   [facility], [place] P, DO   2 years ago Primary hypertension   [place] [facility] [place], Oralia Rud, DO       Future Appointments             In 1 month Laural Benes, Oralia Rud, DO  [facility]

## 2023-09-03 DIAGNOSIS — L578 Other skin changes due to chronic exposure to nonionizing radiation: Secondary | ICD-10-CM | POA: Diagnosis not present

## 2023-09-03 DIAGNOSIS — Z872 Personal history of diseases of the skin and subcutaneous tissue: Secondary | ICD-10-CM | POA: Diagnosis not present

## 2023-09-03 DIAGNOSIS — Z808 Family history of malignant neoplasm of other organs or systems: Secondary | ICD-10-CM | POA: Diagnosis not present

## 2023-09-03 DIAGNOSIS — Z86018 Personal history of other benign neoplasm: Secondary | ICD-10-CM | POA: Diagnosis not present

## 2023-09-24 ENCOUNTER — Ambulatory Visit
Admission: RE | Admit: 2023-09-24 | Discharge: 2023-09-24 | Discharge status: Home / Self Care | Source: Ambulatory Visit | Attending: Oncology | Admitting: Radiation Oncology

## 2023-09-24 ENCOUNTER — Encounter: Payer: Self-pay | Admitting: Radiation Oncology

## 2023-09-24 VITALS — BP 128/84 | HR 91 | Temp 97.6°F | Wt 172.0 lb

## 2023-09-24 DIAGNOSIS — D0511 Intraductal carcinoma in situ of right breast: Secondary | ICD-10-CM | POA: Diagnosis not present

## 2023-09-24 NOTE — Progress Notes (Signed)
 Radiation Oncology Follow up Note  Name: Nichole Gordon   Date:   09/24/2023 MRN:  [id number] DOB: 1965/09/22    This 59 y.o. female presents to the clinic today for 1 month follow-up status post whole breast radiation to her right breast for stage 0 (pTis N0 M0) ER positive ductal carcinoma in situ status post wide local excision.  REFERRING PROVIDER: Dorcas Carrow, DO  HPI: Patient is a 58 year old female now out 1 month having completed whole breast radiation to her right breast for ER positive ductal carcinoma in situ.  Seen today in routine follow-up she is doing well specifically denies breast tenderness cough or bone pain..  She has been started on Femara tolerating that well without side effect.  COMPLICATIONS OF TREATMENT: none  FOLLOW UP COMPLIANCE: keeps appointments   PHYSICAL EXAM:  BP 128/84   Pulse 91   Temp 97.6 F (36.4 C) (Tympanic)   Wt 172 lb (78 kg)   LMP 09/30/2018 (Approximate)   BMI 29.99 kg/m  Lungs are clear to A&P cardiac examination essentially unremarkable with regular rate and rhythm. No dominant mass or nodularity is noted in either breast in 2 positions examined. Incision is well-healed. No axillary or supraclavicular adenopathy is appreciated. Cosmetic result is excellent.  Well-developed well-nourished patient in NAD. HEENT reveals PERLA, EOMI, discs not visualized.  Oral cavity is clear. No oral mucosal lesions are identified. Neck is clear without evidence of cervical or supraclavicular adenopathy. Lungs are clear to A&P. Cardiac examination is essentially unremarkable with regular rate and rhythm without murmur rub or thrill. Abdomen is benign with no organomegaly or masses noted. Motor sensory and DTR levels are equal and symmetric in the upper and lower extremities. Cranial nerves II through XII are grossly intact. Proprioception is intact. No peripheral adenopathy or edema is identified. No motor or sensory levels are noted. Crude visual fields are  within normal range.  RADIOLOGY RESULTS: No current films for review  PLAN: Present time patient is doing well very low side effect profile from whole breast radiation and pleased with her overall progress.  She continues on Femara without side effect.  Asked to see her back in 6 months for follow-up.  Patient knows to call with any concerns.  I would like to take this opportunity to thank you for allowing me to participate in the care of your patient.Carmina Miller, MD

## 2023-10-07 ENCOUNTER — Other Ambulatory Visit: Payer: Self-pay | Admitting: Oncology

## 2023-10-10 ENCOUNTER — Inpatient Hospital Stay: Payer: 59 | Admitting: Oncology

## 2023-10-10 ENCOUNTER — Inpatient Hospital Stay: Payer: 59 | Attending: Oncology

## 2023-10-10 ENCOUNTER — Encounter: Payer: Self-pay | Admitting: Oncology

## 2023-10-10 VITALS — BP 132/61 | HR 94 | Temp 98.0°F | Resp 18 | Ht 63.5 in | Wt 171.5 lb

## 2023-10-10 DIAGNOSIS — Z79811 Long term (current) use of aromatase inhibitors: Secondary | ICD-10-CM

## 2023-10-10 DIAGNOSIS — D0511 Intraductal carcinoma in situ of right breast: Secondary | ICD-10-CM | POA: Insufficient documentation

## 2023-10-10 DIAGNOSIS — Z79899 Other long term (current) drug therapy: Secondary | ICD-10-CM

## 2023-10-10 DIAGNOSIS — Z08 Encounter for follow-up examination after completed treatment for malignant neoplasm: Secondary | ICD-10-CM

## 2023-10-10 DIAGNOSIS — Z8041 Family history of malignant neoplasm of ovary: Secondary | ICD-10-CM

## 2023-10-10 LAB — CMP (CANCER CENTER ONLY)
ALT: 21 U/L (ref 0–44)
AST: 28 U/L (ref 15–41)
Albumin: 4.2 g/dL (ref 3.5–5.0)
Alkaline Phosphatase: 95 U/L (ref 38–126)
Anion gap: 8 (ref 5–15)
BUN: 14 mg/dL (ref 6–20)
CO2: 26 mmol/L (ref 22–32)
Calcium: 9.4 mg/dL (ref 8.9–10.3)
Chloride: 104 mmol/L (ref 98–111)
Creatinine: 0.46 mg/dL (ref 0.44–1.00)
GFR, Estimated: >60 mL/min (ref >60)
Glucose, Bld: 120 mg/dL — ABNORMAL HIGH (ref 70–99)
Potassium: 3.8 mmol/L (ref 3.5–5.1)
Sodium: 138 mmol/L (ref 135–145)
Total Bilirubin: 0.5 mg/dL (ref 0.0–1.2)
Total Protein: 7.4 g/dL (ref 6.5–8.1)

## 2023-10-10 NOTE — Progress Notes (Signed)
 Hematology/Oncology Consult note [facility]  [phone] [phone]  Patient Care Team: Solomon Dupre, DO as PCP - General (Family Medicine) Avonne Boettcher, MD as Consulting Physician (Oncology) Waverly Hageman, RN as Oncology Nurse Navigator Glenis Langdon, MD as Consulting Physician (Radiation Oncology)   Name of the patient: Nichole Gordon  [id number]  1965/09/06   Date of visit: 10/10/23  Diagnosis-right breast DCIS ER positive  Chief complaint/ Reason for visit-routine follow-up of right breast DCIS  Heme/Onc history: patient is a 58 year old female with a past medical history significant for hypertension hypothyroidism and GERD who underwent a screening mammogram in November 2024 which showed possible calcifications in the right breast.  This was followed by diagnostic mammogram which showed 3 mm group of calcifications in the upper outer quadrant of the right breast.  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  Tumor was ER +100% PR +20%.  Lumpectomy pathology from 06/23/2023 showed at least 4 mm grade 2 DCIS with negative margins.  Comedonecrosis present.  Distance from DCIS to closest margin 9 mm.  Family history significant for ovarian cancer.  Genetic testing negative.  Patient completed adjuvant radiation therapy and started letrozole  in April 2025    Interval history-patient has been on letrozole  for about 3 weeks now and is tolerating it well without any significant side effects.  She is also taking her calcium and vitamin D   ECOG PS- 0 Pain scale- 0   Review of systems- Review of Systems  Constitutional:  Negative for chills, fever, malaise/fatigue and weight loss.  HENT:  Negative for congestion, ear discharge and nosebleeds.   Eyes:  Negative for blurred vision.  Respiratory:  Negative for cough, hemoptysis, sputum production, shortness of breath and wheezing.    Cardiovascular:  Negative for chest pain, palpitations, orthopnea and claudication.  Gastrointestinal:  Negative for abdominal pain, blood in stool, constipation, diarrhea, heartburn, melena, nausea and vomiting.  Genitourinary:  Negative for dysuria, flank pain, frequency, hematuria and urgency.  Musculoskeletal:  Negative for back pain, joint pain and myalgias.  Skin:  Negative for rash.  Neurological:  Negative for dizziness, tingling, focal weakness, seizures, weakness and headaches.  Endo/Heme/Allergies:  Does not bruise/bleed easily.  Psychiatric/Behavioral:  Negative for depression and suicidal ideas. The patient does not have insomnia.       Allergies  Allergen Reactions   Amoxicillin Rash   Omnipaque [Iohexol] Shortness Of Breath   Phenytoin Sodium Extended Swelling   Depakote [Valproic Acid] Swelling    Hair and falls out    Iodinated Contrast Media Itching   Trileptal [Oxcarbazepine] Swelling     Past Medical History:  Diagnosis Date   Anxiety    Family history of adverse reaction to anesthesia    Mother - low BP   Fibrocystic breast disease    GERD (gastroesophageal reflux disease)    Heart murmur    Hypertension    Hypothyroidism    Mixed hyperlipidemia 08/15/2022   Motion sickness    car back seat    Palpitations    Seizures (HCC)    Wears contact lenses      Past Surgical History:  Procedure Laterality Date   BREAST BIOPSY Right 06/17/2019   stereotactic biopsy, x-clip,  MULTIPLE FRAGMENTS OF CYST WALL FOCAL USUAL DUCTAL HYPERPLASIA, COLUMNAR CELL CHANGE, AND FIBROSIS.   BREAST BIOPSY Right 05/26/2023   rt br stereo, calcs, x clip, path pending.   BREAST  BIOPSY Right 05/26/2023   MM RT BREAST BX W LOC DEV 1ST LESION IMAGE BX SPEC STEREO GUIDE 05/26/2023 [facility]   BREAST BIOPSY Right 06/17/2023   MM RT RADIO FREQUENCY TAG LOC MAMMO GUIDE 06/17/2023 [facility]   COLONOSCOPY WITH PROPOFOL  N/A 01/25/2019   Procedure: COLONOSCOPY WITH  PROPOFOL ;  Surgeon: Marnee Sink, MD;  Location: [facility];  Service: Endoscopy;  Laterality: N/A;   LITHOTRIPSY  2012   POLYPECTOMY  01/25/2019   Procedure: POLYPECTOMY;  Surgeon: Marnee Sink, MD;  Location: [facility];  Service: Endoscopy;;    Social History   Socioeconomic History   Marital status: Married    Spouse name: Not on file   Number of children: Not on file   Years of education: Not on file   Highest education level: Not on file  Occupational History   Not on file  Tobacco Use   Smoking status: Never   Smokeless tobacco: Never  Vaping Use   Vaping status: Never Used  Substance and Sexual Activity   Alcohol use: No   Drug use: No   Sexual activity: Yes    Birth control/protection: None  Other Topics Concern   Not on file  Social History Narrative   Not on file   Social Drivers of Health   Financial Resource Strain: Low Risk  (06/12/2023)   Received from [facility]   Overall Financial Resource Strain (CARDIA)    Difficulty of Paying Living Expenses: Not hard at all  Food Insecurity: No Food Insecurity (06/12/2023)   Received from [facility]   Hunger Vital Sign    Worried About Running Out of Food in the Last Year: Never true    Ran Out of Food in the Last Year: Never true  Transportation Needs: No Transportation Needs (06/12/2023)   Received from [facility] - Transportation    In the past 12 months, has lack of transportation kept you from medical appointments or from getting medications?: No    Lack of Transportation (Non-Medical): No  Physical Activity: Not on file  Stress: Not on file  Social Connections: Not on file  Intimate Partner Violence: Not At Risk (06/02/2023)   Humiliation, Afraid, Rape, and Kick questionnaire    Fear of Current or Ex-Partner: No    Emotionally Abused: No    Physically Abused: No    Sexually Abused: No    Family History  Problem  Relation Age of Onset   Hyperlipidemia Mother    Hypertension Mother    Arthritis Mother    Ovarian cancer Mother 37   Colon cancer Father 63   Melanoma Sister    Hypertension Brother    Melanoma Brother    Breast cancer Paternal Aunt 43   Breast cancer Paternal Aunt 54   Breast cancer Paternal Aunt 63   Lung cancer Paternal Uncle    Stroke Maternal Grandfather    Hypertension Maternal Grandfather    Lymphoma Paternal Grandmother 38   Breast cancer Other 74     Current Outpatient Medications:    b complex vitamins capsule, Take 1 capsule by mouth daily., Disp: , Rfl:    Cholecalciferol (VITAMIN D ) 2000 UNITS CAPS, Take 2,000 Units by mouth daily., Disp: , Rfl:    escitalopram  (LEXAPRO ) 20 MG tablet, TAKE 1 TABLET BY MOUTH EVERY DAY, Disp: 90 tablet, Rfl: 0   fluticasone (FLONASE) 50 MCG/ACT nasal spray, Place 1 spray into both nostrils 2 (two) times  daily. , Disp: , Rfl:    letrozole  (FEMARA ) 2.5 MG tablet, TAKE 1 TABLET BY MOUTH EVERY DAY, Disp: 90 tablet, Rfl: 1   levothyroxine  (SYNTHROID ) 25 MCG tablet, TAKE 1 TABLET BY MOUTH EVERY DAY, Disp: 90 tablet, Rfl: 1   loratadine (CLARITIN) 10 MG tablet, Take 10 mg by mouth daily., Disp: , Rfl:    losartan  (COZAAR ) 25 MG tablet, Take 1 tablet (25 mg total) by mouth daily., Disp: 90 tablet, Rfl: 1   Multiple Vitamin (MULTIVITAMIN) capsule, Take 1 capsule by mouth daily.  , Disp: , Rfl:    naproxen  (NAPROSYN ) 500 MG tablet, TAKE 1 TABLET BY MOUTH 2 TIMES DAILY WITH A MEAL., Disp: 180 tablet, Rfl: 1   Omega-3 Fatty Acids (FISH OIL PO), Take 2 capsules by mouth daily., Disp: , Rfl:    omeprazole  (PRILOSEC) 20 MG capsule, Take 1 capsule (20 mg total) by mouth daily. (Patient taking differently: Take 20 mg by mouth daily as needed (acid reflux).), Disp: 90 capsule, Rfl: 1  Physical exam:  Vitals:   10/10/23 0949  BP: 132/61  Pulse: 94  Resp: 18  Temp: 98 F (36.7 C)  TempSrc: Tympanic  SpO2: 97%  Weight: 171 lb 8 oz (77.8 kg)   Height: 5' 3.5" (1.613 m)   Physical Exam Cardiovascular:     Rate and Rhythm: Normal rate and regular rhythm.     Heart sounds: Normal heart sounds.  Pulmonary:     Effort: Pulmonary effort is normal.     Breath sounds: Normal breath sounds.  Abdominal:     General: Bowel sounds are normal.     Palpations: Abdomen is soft.  Skin:    General: Skin is warm and dry.  Neurological:     Mental Status: She is alert and oriented to person, place, and time.      I have personally reviewed labs listed below:    Latest Ref Rng & Units 10/10/2023    9:07 AM  CMP  Glucose 70 - 99 mg/dL 536   BUN 6 - 20 mg/dL 14   Creatinine 6.44 - 1.00 mg/dL 0.34   Sodium 742 - 595 mmol/L 138   Potassium 3.5 - 5.1 mmol/L 3.8   Chloride 98 - 111 mmol/L 104   CO2 22 - 32 mmol/L 26   Calcium 8.9 - 10.3 mg/dL 9.4   Total Protein 6.5 - 8.1 g/dL 7.4   Total Bilirubin 0.0 - 1.2 mg/dL 0.5   Alkaline Phos 38 - 126 U/L 95   AST 15 - 41 U/L 28   ALT 0 - 44 U/L 21       Latest Ref Rng & Units 08/14/2023    9:52 AM  CBC  WBC 4.0 - 10.5 K/uL 6.9   Hemoglobin 12.0 - 15.0 g/dL 63.8   Hematocrit 75.6 - 46.0 % 42.8   Platelets 150 - 400 K/uL 231      Assessment and plan- Patient is a 58 y.o. female with history of right breast DCIS ER positive status postlumpectomy and adjuvant radiation therapy currently on letrozole  here for routine follow-up  Patient has completed adjuvant radiation and started taking letrozole  about 3 weeks ago.  She is tolerating it well Without any significant side effects.  She will continue to take this along with calcium 1200 mg and vitamin D  800 international units.  Baseline bone density scan was normal with a T-score of -1 in the left femur neck.  Will plan to monitor this every  other year.  She will stay on letrozole  for 5 years.  I will see her back in 4 months no labs   Visit Diagnosis 1. Encounter for follow-up surveillance of ductal carcinoma in situ (DCIS) of breast   2.  Use of letrozole  (Femara )   3. High risk medication use      Dr. Seretha Dance, MD, MPH [facility] [phone] 10/10/2023 2:16 PM

## 2023-10-17 ENCOUNTER — Ambulatory Visit: Payer: Self-pay | Admitting: Family Medicine

## 2023-10-17 ENCOUNTER — Encounter: Payer: Self-pay | Admitting: Family Medicine

## 2023-10-17 VITALS — BP 124/81 | HR 86

## 2023-10-17 DIAGNOSIS — E039 Hypothyroidism, unspecified: Secondary | ICD-10-CM | POA: Diagnosis not present

## 2023-10-17 DIAGNOSIS — F419 Anxiety disorder, unspecified: Secondary | ICD-10-CM | POA: Diagnosis not present

## 2023-10-17 DIAGNOSIS — E782 Mixed hyperlipidemia: Secondary | ICD-10-CM

## 2023-10-17 DIAGNOSIS — I10 Essential (primary) hypertension: Secondary | ICD-10-CM | POA: Diagnosis not present

## 2023-10-17 DIAGNOSIS — Z23 Encounter for immunization: Secondary | ICD-10-CM | POA: Diagnosis not present

## 2023-10-17 MED ORDER — ESCITALOPRAM OXALATE 20 MG PO TABS: 20.0000 mg | ORAL_TABLET | Freq: Every day | ORAL | 1 refills | Status: DC

## 2023-10-17 MED ORDER — LOSARTAN POTASSIUM 25 MG PO TABS: 25.0000 mg | ORAL_TABLET | Freq: Every day | ORAL | 1 refills | Status: DC

## 2023-10-17 MED ORDER — OMEPRAZOLE 20 MG PO CPDR: 20.0000 mg | DELAYED_RELEASE_CAPSULE | Freq: Every day | ORAL | 1 refills | Status: DC

## 2023-10-17 NOTE — Assessment & Plan Note (Signed)
 Rechecking labs today. Await results. Treat as needed.

## 2023-10-17 NOTE — Assessment & Plan Note (Signed)
 Under good control on current regimen. Continue current regimen. Continue to monitor. Call with any concerns. Refills given. Labs drawn today.

## 2023-10-17 NOTE — Assessment & Plan Note (Signed)
 Under good control on current regimen. Continue current regimen. Continue to monitor. Call with any concerns. Refills given.

## 2023-10-17 NOTE — Progress Notes (Signed)
 BP 124/81   Pulse 86   LMP 09/30/2018 (Approximate)   SpO2 97%    Subjective:    Patient ID: Nichole Gordon, female    DOB: 06/21/1965, 58 y.o.   MRN: [id number]  HPI: Nichole Gordon is a 58 y.o. female  Chief Complaint  Patient presents with   Hypertension   HYPERTENSION / HYPERLIPIDEMIA Satisfied with current treatment? yes Duration of hypertension: chronic BP monitoring frequency: not checking BP medication side effects: no Past BP meds: losartan  Duration of hyperlipidemia: chronic Cholesterol medication side effects: no Cholesterol supplements: fish oil Past cholesterol medications: none Medication compliance: excellent compliance Aspirin: no Recent stressors: no Recurrent headaches: no Visual changes: no Palpitations: no Dyspnea: no Chest pain: no Lower extremity edema: no Dizzy/lightheaded: no  HYPOTHYROIDISM Thyroid  control status:controlled Satisfied with current treatment? yes Medication side effects: no Medication compliance: excellent compliance Recent dose adjustment:no Fatigue: no Cold intolerance: no Heat intolerance: no Weight gain: no Weight loss: no Constipation: no Diarrhea/loose stools: no Palpitations: no Lower extremity edema: no Anxiety/depressed mood: no  ANXIETY/STRESS Duration: chronic Status:controlled Anxious mood: no  Excessive worrying: no Irritability: no  Sweating: no Nausea: no Palpitations:no Hyperventilation: no Panic attacks: no Agoraphobia: no  Obscessions/compulsions: no Depressed mood: no    10/17/2023    8:38 AM 06/02/2023    1:38 PM 04/21/2023    1:43 PM 03/03/2023   10:36 AM 08/15/2022    9:32 AM  Depression screen PHQ 2/9  Decreased Interest 0 0 0 0 0  Down, Depressed, Hopeless 0 0 0 0 0  PHQ - 2 Score 0 0 0 0 0  Altered sleeping 0   0 0  Tired, decreased energy 1   1 1   Change in appetite 0   0 0  Feeling bad or failure about yourself  0   0 0  Trouble concentrating 0   0 0  Moving slowly or  fidgety/restless 0   0 0  Suicidal thoughts 0   0 0  PHQ-9 Score 1   1 1   Difficult doing work/chores    Not difficult at all    Anhedonia: no Weight changes: no Insomnia: no   Hypersomnia: no Fatigue/loss of energy: no Feelings of worthlessness: no Feelings of guilt: no Impaired concentration/indecisiveness: no Suicidal ideations: no  Crying spells: no Recent Stressors/Life Changes: no   Relationship problems: no   Family stress: no     Financial stress: no    Job stress: yes    Recent death/loss: no  Relevant past medical, surgical, family and social history reviewed and updated as indicated. Interim medical history since our last visit reviewed. Allergies and medications reviewed and updated.  Review of Systems  Constitutional: Negative.   Respiratory: Negative.    Cardiovascular: Negative.   Gastrointestinal: Negative.   Musculoskeletal: Negative.   Neurological: Negative.   Psychiatric/Behavioral: Negative.      Per HPI unless specifically indicated above     Objective:    BP 124/81   Pulse 86   LMP 09/30/2018 (Approximate)   SpO2 97%   Wt Readings from Last 3 Encounters:  10/10/23 171 lb 8 oz (77.8 kg)  09/24/23 172 lb (78 kg)  07/08/23 170 lb 9.6 oz (77.4 kg)    Physical Exam Vitals and nursing note reviewed.  Constitutional:      General: She is not in acute distress.    Appearance: Normal appearance. She is not ill-appearing, toxic-appearing or diaphoretic.  HENT:  Head: Normocephalic and atraumatic.     Right Ear: External ear normal.     Left Ear: External ear normal.     Nose: Nose normal.     Mouth/Throat:     Mouth: Mucous membranes are moist.     Pharynx: Oropharynx is clear.  Eyes:     General: No scleral icterus.       Right eye: No discharge.        Left eye: No discharge.     Extraocular Movements: Extraocular movements intact.     Conjunctiva/sclera: Conjunctivae normal.     Pupils: Pupils are equal, round, and reactive to  light.  Cardiovascular:     Rate and Rhythm: Normal rate and regular rhythm.     Pulses: Normal pulses.     Heart sounds: Normal heart sounds. No murmur heard.    No friction rub. No gallop.  Pulmonary:     Effort: Pulmonary effort is normal. No respiratory distress.     Breath sounds: Normal breath sounds. No stridor. No wheezing, rhonchi or rales.  Chest:     Chest wall: No tenderness.  Musculoskeletal:        General: Normal range of motion.     Cervical back: Normal range of motion and neck supple.  Skin:    General: Skin is warm and dry.     Capillary Refill: Capillary refill takes less than 2 seconds.     Coloration: Skin is not jaundiced or pale.     Findings: No bruising, erythema, lesion or rash.  Neurological:     General: No focal deficit present.     Mental Status: She is alert and oriented to person, place, and time. Mental status is at baseline.  Psychiatric:        Mood and Affect: Mood normal.        Behavior: Behavior normal.        Thought Content: Thought content normal.        Judgment: Judgment normal.     Results for orders placed or performed in visit on 10/10/23  CMP (Cancer Center only)   Collection Time: 10/10/23  9:07 AM  Result Value Ref Range   Sodium 138 135 - 145 mmol/L   Potassium 3.8 3.5 - 5.1 mmol/L   Chloride 104 98 - 111 mmol/L   CO2 26 22 - 32 mmol/L   Glucose, Bld 120 (H) 70 - 99 mg/dL   BUN 14 6 - 20 mg/dL   Creatinine 9.60 4.54 - 1.00 mg/dL   Calcium 9.4 8.9 - 09.8 mg/dL   Total Protein 7.4 6.5 - 8.1 g/dL   Albumin 4.2 3.5 - 5.0 g/dL   AST 28 15 - 41 U/L   ALT 21 0 - 44 U/L   Alkaline Phosphatase 95 38 - 126 U/L   Total Bilirubin 0.5 0.0 - 1.2 mg/dL   GFR, Estimated >11 >91 mL/min   Anion gap 8 5 - 15      Assessment & Plan:   Problem List Items Addressed This Visit       Cardiovascular and Mediastinum   Hypertension   Under good control on current regimen. Continue current regimen. Continue to monitor. Call with any  concerns. Refills given. Labs drawn today.        Relevant Medications   losartan  (COZAAR ) 25 MG tablet   Other Relevant Orders   CBC with Differential/Platelet   Comprehensive metabolic panel with GFR     Endocrine  Hypothyroidism (Chronic)   Rechecking labs today. Await results. Treat as needed.       Relevant Orders   CBC with Differential/Platelet   TSH     Other   Anxiety - Primary   Under good control on current regimen. Continue current regimen. Continue to monitor. Call with any concerns. Refills given.        Relevant Medications   escitalopram  (LEXAPRO ) 20 MG tablet   Other Relevant Orders   CBC with Differential/Platelet   Mixed hyperlipidemia   Rechecking labs today. Await results. Treat as needed.       Relevant Medications   losartan  (COZAAR ) 25 MG tablet   Other Relevant Orders   CBC with Differential/Platelet   Lipid Panel w/o Chol/HDL Ratio   Comprehensive metabolic panel with GFR   Other Visit Diagnoses       Need for shingles vaccine       Shingles #1 given today.   Relevant Orders   Ambulatory referral to Gastroenterology        Follow up plan: Return in about 6 months (around 04/18/2024) for physical with me, 3 months nurse visit for 2nd shingles.

## 2023-10-18 LAB — COMPREHENSIVE METABOLIC PANEL WITH GFR
ALT: 14 IU/L (ref 0–32)
AST: 20 IU/L (ref 0–40)
Albumin: 4.5 g/dL (ref 3.8–4.9)
Alkaline Phosphatase: 120 IU/L (ref 44–121)
BUN/Creatinine Ratio: 25 (ref 12–28)
BUN: 16 mg/dL (ref 8–27)
Bilirubin Total: 0.3 mg/dL (ref 0.0–1.2)
CO2: 27 mmol/L (ref 20–29)
Calcium: 9.4 mg/dL (ref 8.7–10.3)
Chloride: 102 mmol/L (ref 96–106)
Creatinine, Ser: 0.63 mg/dL (ref 0.57–1.00)
Globulin, Total: 2.1 g/dL (ref 1.5–4.5)
Glucose: 124 mg/dL — ABNORMAL HIGH (ref 70–99)
Potassium: 4.2 mmol/L (ref 3.5–5.2)
Sodium: 142 mmol/L (ref 134–144)
Total Protein: 6.6 g/dL (ref 6.0–8.5)
eGFR: 101 mL/min/{1.73_m2} (ref >59)

## 2023-10-18 LAB — LIPID PANEL W/O CHOL/HDL RATIO
Cholesterol, Total: 209 mg/dL — ABNORMAL HIGH (ref 100–199)
HDL: 59 mg/dL (ref >39)
LDL Chol Calc (NIH): 133 mg/dL — ABNORMAL HIGH (ref 0–99)
Triglycerides: 95 mg/dL (ref 0–149)
VLDL Cholesterol Cal: 17 mg/dL (ref 5–40)

## 2023-10-18 LAB — CBC WITH DIFFERENTIAL/PLATELET
Basophils Absolute: 0 10*3/uL (ref 0.0–0.2)
Basos: 1 %
EOS (ABSOLUTE): 0.1 10*3/uL (ref 0.0–0.4)
Eos: 1 %
Hematocrit: 41.4 % (ref 34.0–46.6)
Hemoglobin: 13.8 g/dL (ref 11.1–15.9)
Immature Grans (Abs): 0 10*3/uL (ref 0.0–0.1)
Immature Granulocytes: 0 %
Lymphocytes Absolute: 1.2 10*3/uL (ref 0.7–3.1)
Lymphs: 24 %
MCH: 30.5 pg (ref 26.6–33.0)
MCHC: 33.3 g/dL (ref 31.5–35.7)
MCV: 92 fL (ref 79–97)
Monocytes Absolute: 0.3 10*3/uL (ref 0.1–0.9)
Monocytes: 6 %
Neutrophils Absolute: 3.4 10*3/uL (ref 1.4–7.0)
Neutrophils: 68 %
Platelets: 229 10*3/uL (ref 150–450)
RBC: 4.52 x10E6/uL (ref 3.77–5.28)
RDW: 12.7 % (ref 11.7–15.4)
WBC: 5.1 10*3/uL (ref 3.4–10.8)

## 2023-10-18 LAB — TSH: TSH: 0.96 u[IU]/mL (ref 0.450–4.500)

## 2023-10-19 ENCOUNTER — Encounter: Payer: Self-pay | Admitting: Nurse Practitioner

## 2023-10-19 NOTE — Progress Notes (Signed)
 Contacted via MyChart The 10-year ASCVD risk score (Arnett DK, et al., 2019) is: 4.1%   Values used to calculate the score:     Age: 58 years     Sex: Female     Is Non-Hispanic African American: No     Diabetic: No     Tobacco smoker: No     Systolic Blood Pressure: 124 mmHg     Is BP treated: Yes     HDL Cholesterol: 59 mg/dL     Total Cholesterol: 209 mg/dL   Good morning Nichole Gordon, your labs have returned and overall remain stable with exception of ongoing elevation in lipid panel levels.  Continue Omega 3 and focus on diet.  Any questions?

## 2023-10-20 ENCOUNTER — Ambulatory Visit: Payer: 59 | Admitting: Family Medicine

## 2023-10-21 ENCOUNTER — Ambulatory Visit: Payer: 59 | Admitting: Family Medicine

## 2024-01-19 ENCOUNTER — Ambulatory Visit

## 2024-01-20 ENCOUNTER — Ambulatory Visit (INDEPENDENT_AMBULATORY_CARE_PROVIDER_SITE_OTHER)

## 2024-01-20 DIAGNOSIS — Z23 Encounter for immunization: Secondary | ICD-10-CM | POA: Diagnosis not present

## 2024-01-20 NOTE — Progress Notes (Signed)
 Patient is in office today for a nurse visit for Immunization. Patient Injection was given in the  Left deltoid. Patient tolerated injection well.

## 2024-02-09 ENCOUNTER — Encounter: Payer: Self-pay | Admitting: Oncology

## 2024-02-09 ENCOUNTER — Inpatient Hospital Stay: Attending: Oncology | Admitting: Oncology

## 2024-02-09 VITALS — BP 155/94 | HR 90 | Temp 97.5°F | Resp 18 | Ht 63.5 in | Wt 170.1 lb

## 2024-02-09 DIAGNOSIS — D0511 Intraductal carcinoma in situ of right breast: Secondary | ICD-10-CM | POA: Insufficient documentation

## 2024-02-09 DIAGNOSIS — Z79899 Other long term (current) drug therapy: Secondary | ICD-10-CM

## 2024-02-09 DIAGNOSIS — Z923 Personal history of irradiation: Secondary | ICD-10-CM | POA: Diagnosis not present

## 2024-02-09 DIAGNOSIS — Z79811 Long term (current) use of aromatase inhibitors: Secondary | ICD-10-CM | POA: Diagnosis not present

## 2024-02-09 DIAGNOSIS — Z08 Encounter for follow-up examination after completed treatment for malignant neoplasm: Secondary | ICD-10-CM

## 2024-02-09 NOTE — Progress Notes (Signed)
 Hematology/Oncology Consult note [facility]  [phone] [phone]  Patient Care Team: Vicci Duwaine SQUIBB, DO as PCP - General (Family Medicine) Melanee Annah BROCKS, MD as Consulting Physician (Oncology) Georgina Shasta POUR, RN as Oncology Nurse Navigator Lenn Aran, MD as Consulting Physician (Radiation Oncology) Rodolph Romano, MD as Consulting Physician (General Surgery)   Name of the patient: Nichole Gordon  [id number]  Jan 06, 1966   Date of visit: 02/09/24  Diagnosis-right breast DCIS ER positive  Chief complaint/ Reason for visit-routine follow-up of right breast DCIS  Heme/Onc history:  patient is a 58 year old female with a past medical history significant for hypertension hypothyroidism and GERD who underwent a screening mammogram in November 2024 which showed possible calcifications in the right breast.  This was followed by diagnostic mammogram which showed 3 mm group of calcifications in the upper outer quadrant of the right breast.  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  Tumor was ER +100% PR +20%.  Lumpectomy pathology from 06/23/2023 showed at least 4 mm grade 2 DCIS with negative margins.  Comedonecrosis present.  Distance from DCIS to closest margin 9 mm.  Family history significant for ovarian cancer.  Genetic testing negative.  Patient completed adjuvant radiation therapy and started letrozole  in April 2025   Interval history-tolerating letrozole  well without any significant side effects.  Denies any breast concerns today  ECOG PS- 0 Pain scale- 0   Review of systems- Review of Systems  Constitutional:  Negative for chills, fever, malaise/fatigue and weight loss.  HENT:  Negative for congestion, ear discharge and nosebleeds.   Eyes:  Negative for blurred vision.  Respiratory:  Negative for cough, hemoptysis, sputum production, shortness of breath and wheezing.    Cardiovascular:  Negative for chest pain, palpitations, orthopnea and claudication.  Gastrointestinal:  Negative for abdominal pain, blood in stool, constipation, diarrhea, heartburn, melena, nausea and vomiting.  Genitourinary:  Negative for dysuria, flank pain, frequency, hematuria and urgency.  Musculoskeletal:  Negative for back pain, joint pain and myalgias.  Skin:  Negative for rash.  Neurological:  Negative for dizziness, tingling, focal weakness, seizures, weakness and headaches.  Endo/Heme/Allergies:  Does not bruise/bleed easily.  Psychiatric/Behavioral:  Negative for depression and suicidal ideas. The patient does not have insomnia.       Allergies  Allergen Reactions   Amoxicillin Rash   Omnipaque [Iohexol] Shortness Of Breath   Phenytoin Sodium Extended Swelling   Depakote [Valproic Acid] Swelling    Hair and falls out    Iodinated Contrast Media Itching   Trileptal [Oxcarbazepine] Swelling     Past Medical History:  Diagnosis Date   Anxiety    Family history of adverse reaction to anesthesia    Mother - low BP   Fibrocystic breast disease    GERD (gastroesophageal reflux disease)    Heart murmur    Hypertension    Hypothyroidism    Mixed hyperlipidemia 08/15/2022   Motion sickness    car back seat    Palpitations    Seizures (HCC)    Wears contact lenses      Past Surgical History:  Procedure Laterality Date   BREAST BIOPSY Right 06/17/2019   stereotactic biopsy, x-clip,  MULTIPLE FRAGMENTS OF CYST WALL FOCAL USUAL DUCTAL HYPERPLASIA, COLUMNAR CELL CHANGE, AND FIBROSIS.   BREAST BIOPSY Right 05/26/2023   rt br stereo, calcs, x clip, path pending.   BREAST BIOPSY Right 05/26/2023   MM RT BREAST  BX W LOC DEV 1ST LESION IMAGE BX SPEC STEREO GUIDE 05/26/2023 [facility]   BREAST BIOPSY Right 06/17/2023   MM RT RADIO FREQUENCY TAG LOC MAMMO GUIDE 06/17/2023 [facility]   COLONOSCOPY WITH PROPOFOL  N/A 01/25/2019   Procedure: COLONOSCOPY WITH  PROPOFOL ;  Surgeon: Jinny Carmine, MD;  Location[facility];  Service: Endoscopy;  Laterality: N/A;   LITHOTRIPSY  2012   POLYPECTOMY  01/25/2019   Procedure: POLYPECTOMY;  Surgeon: Jinny Carmine, MD;  Location: [facility];  Service: Endoscopy;;    Social History   Socioeconomic History   Marital status: Married    Spouse name: Not on file   Number of children: Not on file   Years of education: Not on file   Highest education level: Bachelor's degree (e.g., BA, AB, BS)  Occupational History   Not on file  Tobacco Use   Smoking status: Never   Smokeless tobacco: Never  Vaping Use   Vaping status: Never Used  Substance and Sexual Activity   Alcohol use: No   Drug use: No   Sexual activity: Yes    Birth control/protection: None  Other Topics Concern   Not on file  Social History Narrative   Not on file   Social Drivers of Health   Financial Resource Strain: Low Risk  (10/13/2023)   Overall Financial Resource Strain (CARDIA)    Difficulty of Paying Living Expenses: Not hard at all  Food Insecurity: No Food Insecurity (10/13/2023)   Hunger Vital Sign    Worried About Running Out of Food in the Last Year: Never true    Ran Out of Food in the Last Year: Never true  Transportation Needs: No Transportation Needs (10/13/2023)   PRAPARE - Administrator, Civil Service (Medical): No    Lack of Transportation (Non-Medical): No  Physical Activity: Insufficiently Active (10/13/2023)   Exercise Vital Sign    Days of Exercise per Week: 2 days    Minutes of Exercise per Session: 30 min  Stress: No Stress Concern Present (10/13/2023)   [employer] of Occupational Health - Occupational Stress Questionnaire    Feeling of Stress : Not at all  Social Connections: Socially Integrated (10/13/2023)   Social Connection and Isolation Panel    Frequency of Communication with Friends and Family: More than three times a week    Frequency of Social Gatherings with  Friends and Family: Twice a week    Attends Religious Services: More than 4 times per year    Active Member of [employer] or Organizations: Yes    Attends Banker Meetings: 1 to 4 times per year    Marital Status: Married  Catering manager Violence: Not At Risk (06/02/2023)   Humiliation, Afraid, Rape, and Kick questionnaire    Fear of Current or Ex-Partner: No    Emotionally Abused: No    Physically Abused: No    Sexually Abused: No    Family History  Problem Relation Age of Onset   Hyperlipidemia Mother    Hypertension Mother    Arthritis Mother    Ovarian cancer Mother 42   Colon cancer Father 47   Melanoma Sister    Hypertension Brother    Melanoma Brother    Breast cancer Paternal Aunt 33   Breast cancer Paternal Aunt 7   Breast cancer Paternal Aunt 48   Lung cancer Paternal Uncle    Stroke Maternal Grandfather    Hypertension Maternal Grandfather    Lymphoma Paternal Grandmother 93  Breast cancer Other 55     Current Outpatient Medications:    b complex vitamins capsule, Take 1 capsule by mouth daily., Disp: , Rfl:    Cholecalciferol (VITAMIN D ) 2000 UNITS CAPS, Take 2,000 Units by mouth daily., Disp: , Rfl:    escitalopram  (LEXAPRO ) 20 MG tablet, Take 1 tablet (20 mg total) by mouth daily., Disp: 90 tablet, Rfl: 1   fluticasone (FLONASE) 50 MCG/ACT nasal spray, Place 1 spray into both nostrils 2 (two) times daily. , Disp: , Rfl:    letrozole  (FEMARA ) 2.5 MG tablet, TAKE 1 TABLET BY MOUTH EVERY DAY, Disp: 90 tablet, Rfl: 1   levothyroxine  (SYNTHROID ) 25 MCG tablet, TAKE 1 TABLET BY MOUTH EVERY DAY, Disp: 90 tablet, Rfl: 1   loratadine (CLARITIN) 10 MG tablet, Take 10 mg by mouth daily., Disp: , Rfl:    losartan  (COZAAR ) 25 MG tablet, Take 1 tablet (25 mg total) by mouth daily., Disp: 90 tablet, Rfl: 1   Multiple Vitamin (MULTIVITAMIN) capsule, Take 1 capsule by mouth daily.  , Disp: , Rfl:    naproxen  (NAPROSYN ) 500 MG tablet, TAKE 1 TABLET BY MOUTH 2 TIMES  DAILY WITH A MEAL., Disp: 180 tablet, Rfl: 1   Omega-3 Fatty Acids (FISH OIL PO), Take 2 capsules by mouth daily., Disp: , Rfl:    omeprazole  (PRILOSEC) 20 MG capsule, Take 1 capsule (20 mg total) by mouth daily., Disp: 90 capsule, Rfl: 1  Physical exam:  Vitals:   02/09/24 0945 02/09/24 1009  BP: (!) 140/63 (!) 155/94  Pulse: 87 90  Resp: 18   Temp: (!) 97.5 F (36.4 C)   TempSrc: Tympanic   SpO2: 100%   Weight: 170 lb 1.6 oz (77.2 kg)   Height: 5' 3.5 (1.613 m)    Physical Exam Cardiovascular:     Rate and Rhythm: Normal rate and regular rhythm.     Heart sounds: Normal heart sounds.  Pulmonary:     Effort: Pulmonary effort is normal.     Breath sounds: Normal breath sounds.  Skin:    General: Skin is warm and dry.  Neurological:     Mental Status: She is alert and oriented to person, place, and time.    Breast exam was performed in seated and lying down position. Patient is status post right lumpectomy with a well-healed surgical scar. No evidence of any palpable masses. No evidence of axillary adenopathy. No evidence of any palpable masses or lumps in the left breast. No evidence of leftt axillary adenopathy   I have personally reviewed labs listed below:    Latest Ref Rng & Units 10/17/2023    8:49 AM  CMP  Glucose 70 - 99 mg/dL 875   BUN 8 - 27 mg/dL 16   Creatinine 9.42 - 1.00 mg/dL 9.36   Sodium 865 - 855 mmol/L 142   Potassium 3.5 - 5.2 mmol/L 4.2   Chloride 96 - 106 mmol/L 102   CO2 20 - 29 mmol/L 27   Calcium 8.7 - 10.3 mg/dL 9.4   Total Protein 6.0 - 8.5 g/dL 6.6   Total Bilirubin 0.0 - 1.2 mg/dL 0.3   Alkaline Phos 44 - 121 IU/L 120   AST 0 - 40 IU/L 20   ALT 0 - 32 IU/L 14       Latest Ref Rng & Units 10/17/2023    8:49 AM  CBC  WBC 3.4 - 10.8 x10E3/uL 5.1   Hemoglobin 11.1 - 15.9 g/dL 86.1   Hematocrit  34.0 - 46.6 % 41.4   Platelets 150 - 450 x10E3/uL 229    Assessment and plan- Patient is a 58 y.o. female with history of right breast DCIS ER  positive status postlumpectomy and adjuvant radiation therapy.  She is presently on letrozole  and this is a routine follow-up visit  Clinically patient is doing well with no concerning signs and symptoms of recurrence based on today's exam.  She is tolerating letrozole  along with calcium and vitamin D  well without any significant side effects.  I will see her back in 6 months no labs   Visit Diagnosis 1. High risk medication use   2. Use of letrozole  (Femara )   3. Encounter for follow-up surveillance of ductal carcinoma in situ (DCIS) of breast      Dr. Annah Skene, MD, MPH [facility] at [facility] [phone] 02/09/2024 12:57 PM

## 2024-02-09 NOTE — Progress Notes (Signed)
 Survivorship Care Plan visit completed.  Treatment summary reviewed and given to patient.  ASCO answers booklet reviewed and given to patient.  CARE program and Cancer Transitions discussed with patient along with other resources cancer center offers to patients and caregivers.  Patient verbalized understanding.

## 2024-02-12 ENCOUNTER — Telehealth: Payer: Self-pay

## 2024-02-12 NOTE — Telephone Encounter (Signed)
 Critical illness claim received and completed pending Dr. Melanee signature.

## 2024-02-18 NOTE — Telephone Encounter (Signed)
 Completed form faxed to company

## 2024-02-23 ENCOUNTER — Other Ambulatory Visit: Payer: Self-pay | Admitting: Family Medicine

## 2024-02-24 NOTE — Telephone Encounter (Signed)
 Requested Prescriptions  Pending Prescriptions Disp Refills   naproxen  (NAPROSYN ) 500 MG tablet [Pharmacy Med Name: NAPROXEN  500 MG TABLET] 180 tablet 1    Sig: TAKE 1 TABLET BY MOUTH 2 TIMES DAILY WITH A MEAL.     Analgesics:  NSAIDS Failed - 02/24/2024 10:15 AM      Failed - Manual Review: Labs are only required if the patient has taken medication for more than 8 weeks.      Passed - Cr in normal range and within 360 days    Creatinine  Date Value Ref Range Status  10/10/2023 0.46 0.44 - 1.00 mg/dL Final   Creat  Date Value Ref Range Status  02/23/2013 0.53 0.50 - 1.10 mg/dL Final   Creatinine, Ser  Date Value Ref Range Status  10/17/2023 0.63 0.57 - 1.00 mg/dL Final         Passed - HGB in normal range and within 360 days    Hemoglobin  Date Value Ref Range Status  10/17/2023 13.8 11.1 - 15.9 g/dL Final         Passed - PLT in normal range and within 360 days    Platelets  Date Value Ref Range Status  10/17/2023 229 150 - 450 x10E3/uL Final         Passed - HCT in normal range and within 360 days    Hematocrit  Date Value Ref Range Status  10/17/2023 41.4 34.0 - 46.6 % Final         Passed - eGFR is 30 or above and within 360 days    GFR calc Af Amer  Date Value Ref Range Status  07/18/2020 118 >59 mL/min/1.73 Final    Comment:    **In accordance with recommendations from the NKF-ASN Task force,**   [facility] is in the process of updating its eGFR calculation to the   2021 CKD-EPI creatinine equation that estimates kidney function   without a race variable.    GFR, Estimated  Date Value Ref Range Status  10/10/2023 >60 >60 mL/min Final    Comment:    (NOTE) Calculated using the CKD-EPI Creatinine Equation (2021)    eGFR  Date Value Ref Range Status  10/17/2023 101 >59 mL/min/1.73 Final         Passed - Patient is not pregnant      Passed - Valid encounter within last 12 months    Recent Outpatient Visits           4 months ago Anxiety   [facility] [place], Megan P, DO               levothyroxine  (SYNTHROID ) 25 MCG tablet [Pharmacy Med Name: LEVOTHYROXINE  25 MCG TABLET] 90 tablet 1    Sig: TAKE 1 TABLET BY MOUTH EVERY DAY     Endocrinology:  Hypothyroid Agents Passed - 02/24/2024 10:15 AM      Passed - TSH in normal range and within 360 days    TSH  Date Value Ref Range Status  10/17/2023 0.960 0.450 - 4.500 uIU/mL Final         Passed - Valid encounter within last 12 months    Recent Outpatient Visits           4 months ago Anxiety   [place] [facility] [place], [place], DO

## 2024-03-25 ENCOUNTER — Ambulatory Visit
Admission: RE | Admit: 2024-03-25 | Discharge: 2024-03-25 | Disposition: A | Discharge status: Home / Self Care | Source: Ambulatory Visit | Attending: Radiation Oncology | Admitting: Radiation Oncology

## 2024-03-25 ENCOUNTER — Encounter: Payer: Self-pay | Admitting: Radiation Oncology

## 2024-03-25 VITALS — BP 108/74 | HR 102 | Temp 97.0°F | Resp 16 | Ht 64.0 in | Wt 170.7 lb

## 2024-03-25 DIAGNOSIS — Z923 Personal history of irradiation: Secondary | ICD-10-CM | POA: Insufficient documentation

## 2024-03-25 DIAGNOSIS — Z79811 Long term (current) use of aromatase inhibitors: Secondary | ICD-10-CM | POA: Diagnosis not present

## 2024-03-25 DIAGNOSIS — D0511 Intraductal carcinoma in situ of right breast: Secondary | ICD-10-CM | POA: Diagnosis not present

## 2024-03-25 DIAGNOSIS — Z170 Estrogen receptor positive status [ER+]: Secondary | ICD-10-CM | POA: Diagnosis not present

## 2024-03-25 NOTE — Progress Notes (Signed)
 Radiation Oncology Follow up Note  Name: Nichole Gordon   Date:   03/25/2024 MRN:  [id number] DOB: 02/18/1966    This 58 y.o. 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  REFERRING PROVIDER: Vicci Duwaine SQUIBB, DO  HPI: Patient is a 58 year old female now at 7 months of good whole breast radiation to her right breast for ER positive ductal carcinoma and site 2.  Seen today in routine 5 she is doing well.  She specifically denies breast tenderness cough or bone pain.  She has been started on.  Femara  tolerated well without side effect.  She has had mammograms ordered although not performed yet.  COMPLICATIONS OF TREATMENT: none  FOLLOW UP COMPLIANCE: keeps appointments   PHYSICAL EXAM:  BP 108/74   Pulse (!) 102   Temp (!) 97 F (36.1 C) (Tympanic)   Resp 16   Ht 5' 4 (1.626 m)   Wt 170 lb 11.2 oz (77.4 kg)   LMP 09/30/2018 (Approximate)   BMI 29.30 kg/m  Lungs are clear to A&P cardiac examination essentially unremarkable with regular rate and rhythm. No dominant mass or nodularity is noted in either breast in 2 positions examined. Incision is well-healed. No axillary or supraclavicular adenopathy is appreciated. Cosmetic result is excellent.  Well-developed well-nourished patient in NAD. HEENT reveals PERLA, EOMI, discs not visualized.  Oral cavity is clear. No oral mucosal lesions are identified. Neck is clear without evidence of cervical or supraclavicular adenopathy. Lungs are clear to A&P. Cardiac examination is essentially unremarkable with regular rate and rhythm without murmur rub or thrill. Abdomen is benign with no organomegaly or masses noted. Motor sensory and DTR levels are equal and symmetric in the upper and lower extremities. Cranial nerves II through XII are grossly intact. Proprioception is intact. No peripheral adenopathy or edema is identified. No motor  or sensory levels are noted. Crude visual fields are within normal range.  RADIOLOGY RESULTS: No current films for review  PLAN: Present time patient is doing well no significant side effect status post whole breast radiation for ER positive ductal carcinoma site 2.  Of asked to see her back in 6 months for follow-up.  Patient knows to call with any concerns.  She continues on Femara  without side effect.  I would like to take this opportunity to thank you for allowing me to participate in the care of your patient.SABRA Marcey Penton, MD

## 2024-03-28 ENCOUNTER — Other Ambulatory Visit: Payer: Self-pay | Admitting: Oncology

## 2024-04-20 ENCOUNTER — Other Ambulatory Visit: Payer: Self-pay | Admitting: Family Medicine

## 2024-04-20 ENCOUNTER — Ambulatory Visit: Admitting: Family Medicine

## 2024-04-20 ENCOUNTER — Encounter: Payer: Self-pay | Admitting: Family Medicine

## 2024-04-20 VITALS — BP 139/89 | HR 86 | Temp 98.2°F | Ht 64.0 in | Wt 172.4 lb

## 2024-04-20 DIAGNOSIS — Z1231 Encounter for screening mammogram for malignant neoplasm of breast: Secondary | ICD-10-CM | POA: Diagnosis not present

## 2024-04-20 DIAGNOSIS — Z0000 Encounter for general adult medical examination without abnormal findings: Secondary | ICD-10-CM | POA: Diagnosis not present

## 2024-04-20 DIAGNOSIS — R928 Other abnormal and inconclusive findings on diagnostic imaging of breast: Secondary | ICD-10-CM

## 2024-04-20 DIAGNOSIS — Z1211 Encounter for screening for malignant neoplasm of colon: Secondary | ICD-10-CM

## 2024-04-20 DIAGNOSIS — E039 Hypothyroidism, unspecified: Secondary | ICD-10-CM

## 2024-04-20 DIAGNOSIS — D0511 Intraductal carcinoma in situ of right breast: Secondary | ICD-10-CM

## 2024-04-20 DIAGNOSIS — E782 Mixed hyperlipidemia: Secondary | ICD-10-CM | POA: Diagnosis not present

## 2024-04-20 DIAGNOSIS — I10 Essential (primary) hypertension: Secondary | ICD-10-CM | POA: Diagnosis not present

## 2024-04-20 DIAGNOSIS — F419 Anxiety disorder, unspecified: Secondary | ICD-10-CM | POA: Diagnosis not present

## 2024-04-20 LAB — MICROALBUMIN, URINE WAIVED
Creatinine, Urine Waived: 50 mg/dL (ref 10–300)
Microalb, Ur Waived: 30 mg/L — ABNORMAL HIGH (ref 0–19)

## 2024-04-20 MED ORDER — LOSARTAN POTASSIUM 25 MG PO TABS: 25.0000 mg | ORAL_TABLET | Freq: Every day | ORAL | 1 refills | Status: AC

## 2024-04-20 MED ORDER — ESCITALOPRAM OXALATE 20 MG PO TABS: 20.0000 mg | ORAL_TABLET | Freq: Every day | ORAL | 1 refills | Status: AC

## 2024-04-20 NOTE — Assessment & Plan Note (Signed)
 Rechecking labs today. Await results. Treat as needed.

## 2024-04-20 NOTE — Assessment & Plan Note (Signed)
 Under good control on current regimen. Continue current regimen. Continue to monitor. Call with any concerns. Refills given. Labs drawn today.

## 2024-04-20 NOTE — Progress Notes (Signed)
 BP 139/89 (BP Location: Right Arm, Cuff Size: Normal)   Pulse 86   Temp 98.2 F (36.8 C) (Oral)   Ht 5' 4 (1.626 m)   Wt 172 lb 6.4 oz (78.2 kg)   LMP 09/30/2018 (Approximate)   SpO2 97%   BMI 29.59 kg/m    Subjective:    Patient ID: Nichole Gordon, female    DOB: 29-Feb-1966, 58 y.o.   MRN: [id number]  HPI: Nichole Gordon is a 58 y.o. female presenting on 04/20/2024 for comprehensive medical examination. Current medical complaints include:  HYPOTHYROIDISM Thyroid  control status:controlled Satisfied with current treatment? yes Medication side effects: no Medication compliance: excellent compliance Recent dose adjustment:no Fatigue: no Cold intolerance: no Heat intolerance: no Weight gain: no Weight loss: no Constipation: no Diarrhea/loose stools: no Palpitations: no Lower extremity edema: no Anxiety/depressed mood: no  HYPERTENSION / HYPERLIPIDEMIA Satisfied with current treatment? yes Duration of hypertension: chronic BP monitoring frequency: rarely BP medication side effects: no Past BP meds: losartan  Duration of hyperlipidemia: chronic Cholesterol medication side effects: no Cholesterol supplements: fish oil Past cholesterol medications: none Medication compliance: excellent compliance Aspirin: no Recent stressors: no Recurrent headaches: no Visual changes: no Palpitations: no Dyspnea: no Chest pain: no Lower extremity edema: no Dizzy/lightheaded: no  ANXIETY/STRESS Duration: chronic Status:controlled Anxious mood: no  Excessive worrying: no Irritability: no  Sweating: no Nausea: no Palpitations:no Hyperventilation: no Panic attacks: no Agoraphobia: no  Obscessions/compulsions: no Depressed mood: no    04/20/2024    8:36 AM 04/20/2024    8:33 AM 03/25/2024    9:02 AM 02/09/2024    9:44 AM 10/17/2023    8:38 AM  Depression screen PHQ 2/9  Decreased Interest 0 0 0 0 0  Down, Depressed, Hopeless 0 0 0 0 0  PHQ - 2 Score 0 0 0 0 0  Altered  sleeping  0   0  Tired, decreased energy  1   1  Change in appetite  0   0  Feeling bad or failure about yourself   0   0  Trouble concentrating  0   0  Moving slowly or fidgety/restless  0   0  Suicidal thoughts  0   0  PHQ-9 Score  1   1   Anhedonia: no Weight changes: no Insomnia: no   Hypersomnia: no Fatigue/loss of energy: no Feelings of worthlessness: no Feelings of guilt: no Impaired concentration/indecisiveness: no Suicidal ideations: no  Crying spells: no Recent Stressors/Life Changes: no   Relationship problems: no   Family stress: no     Financial stress: no    Job stress: no    Recent death/loss: no  She currently lives with: husband Menopausal Symptoms: no  Depression Screen done today and results listed below:     04/20/2024    8:36 AM 04/20/2024    8:33 AM 03/25/2024    9:02 AM 02/09/2024    9:44 AM 10/17/2023    8:38 AM  Depression screen PHQ 2/9  Decreased Interest 0 0 0 0 0  Down, Depressed, Hopeless 0 0 0 0 0  PHQ - 2 Score 0 0 0 0 0  Altered sleeping  0   0  Tired, decreased energy  1   1  Change in appetite  0   0  Feeling bad or failure about yourself   0   0  Trouble concentrating  0   0  Moving slowly or fidgety/restless  0   0  Suicidal  thoughts  0   0  PHQ-9 Score  1   1     Past Medical History:  Past Medical History:  Diagnosis Date   Anxiety    2000   Family history of adverse reaction to anesthesia    Mother - low BP   Fibrocystic breast disease    GERD (gastroesophageal reflux disease) 2022   Heart murmur Birth   Hypertension    Hypothyroidism    Mixed hyperlipidemia 08/15/2022   Motion sickness    car back seat    Palpitations    Seizures (HCC)    Wears contact lenses     Surgical History:  Past Surgical History:  Procedure Laterality Date   BREAST BIOPSY Right 06/17/2019   stereotactic biopsy, x-clip,  MULTIPLE FRAGMENTS OF CYST WALL FOCAL USUAL DUCTAL HYPERPLASIA, COLUMNAR CELL CHANGE, AND FIBROSIS.   BREAST  BIOPSY Right 05/26/2023   rt br stereo, calcs, x clip, path pending.   BREAST BIOPSY Right 05/26/2023   MM RT BREAST BX W LOC DEV 1ST LESION IMAGE BX SPEC STEREO GUIDE 05/26/2023 [facility]   BREAST BIOPSY Right 06/17/2023   MM RT RADIO FREQUENCY TAG LOC MAMMO GUIDE 06/17/2023 [facility]   COLONOSCOPY WITH PROPOFOL  N/A 01/25/2019   Procedure: COLONOSCOPY WITH PROPOFOL ;  Surgeon: Jinny Carmine, MD;  Location: [facility];  Service: Endoscopy;  Laterality: N/A;   LITHOTRIPSY  2012   POLYPECTOMY  01/25/2019   Procedure: POLYPECTOMY;  Surgeon: Jinny Carmine, MD;  Location: [facility];  Service: Endoscopy;;    Medications:  Current Outpatient Medications on File Prior to Visit  Medication Sig   b complex vitamins capsule Take 1 capsule by mouth daily.   Cholecalciferol (VITAMIN D ) 2000 UNITS CAPS Take 2,000 Units by mouth daily.   fluticasone (FLONASE) 50 MCG/ACT nasal spray Place 1 spray into both nostrils 2 (two) times daily.    letrozole  (FEMARA ) 2.5 MG tablet TAKE 1 TABLET BY MOUTH EVERY DAY   levothyroxine  (SYNTHROID ) 25 MCG tablet TAKE 1 TABLET BY MOUTH EVERY DAY   loratadine (CLARITIN) 10 MG tablet Take 10 mg by mouth daily.   Multiple Vitamin (MULTIVITAMIN) capsule Take 1 capsule by mouth daily.     Omega-3 Fatty Acids (FISH OIL PO) Take 2 capsules by mouth daily.   No current facility-administered medications on file prior to visit.    Allergies:  Allergies  Allergen Reactions   Amoxicillin Rash   Omnipaque [Iohexol] Shortness Of Breath   Phenytoin Sodium Extended Swelling   Depakote [Valproic Acid] Swelling    Hair and falls out    Iodinated Contrast Media Itching   Trileptal [Oxcarbazepine] Swelling    Social History:  Social History   Socioeconomic History   Marital status: Married    Spouse name: Not on file   Number of children: Not on file   Years of education: Not on file   Highest education level: Bachelor's degree (e.g., BA, AB,  BS)  Occupational History   Not on file  Tobacco Use   Smoking status: Never   Smokeless tobacco: Never  Vaping Use   Vaping status: Never Used  Substance and Sexual Activity   Alcohol use: No   Drug use: No   Sexual activity: Yes    Birth control/protection: None  Other Topics Concern   Not on file  Social History Narrative   Not on file   Social Drivers of Health   Financial Resource Strain: Low Risk  (04/18/2024)   Overall Financial  Resource Strain (CARDIA)    Difficulty of Paying Living Expenses: Not hard at all  Food Insecurity: No Food Insecurity (04/18/2024)   Hunger Vital Sign    Worried About Running Out of Food in the Last Year: Never true    Ran Out of Food in the Last Year: Never true  Transportation Needs: No Transportation Needs (04/18/2024)   PRAPARE - Administrator, Civil Service (Medical): No    Lack of Transportation (Non-Medical): No  Physical Activity: Insufficiently Active (04/18/2024)   Exercise Vital Sign    Days of Exercise per Week: 2 days    Minutes of Exercise per Session: 30 min  Stress: No Stress Concern Present (04/18/2024)   [employer] of Occupational Health - Occupational Stress Questionnaire    Feeling of Stress: Only a little  Social Connections: Socially Integrated (04/20/2024)   Social Connection and Isolation Panel    Frequency of Communication with Friends and Family: More than three times a week    Frequency of Social Gatherings with Friends and Family: Once a week    Attends Religious Services: More than 4 times per year    Active Member of [employer] or Organizations: Yes    Attends Banker Meetings: 1 to 4 times per year    Marital Status: Married  Catering Manager Violence: Not At Risk (04/20/2024)   Humiliation, Afraid, Rape, and Kick questionnaire    Fear of Current or Ex-Partner: No    Emotionally Abused: No    Physically Abused: No    Sexually Abused: No   Social History   Tobacco Use  Smoking  Status Never  Smokeless Tobacco Never   Social History   Substance and Sexual Activity  Alcohol Use No    Family History:  Family History  Problem Relation Age of Onset   Hyperlipidemia Mother    Hypertension Mother    Arthritis Mother    Ovarian cancer Mother 58   Cancer Mother    Colon cancer Father 76   Cancer Father    Melanoma Sister    Hypertension Brother    Melanoma Brother    Breast cancer Paternal Aunt 72   Breast cancer Paternal Aunt 72   Breast cancer Paternal Aunt 31   Lung cancer Paternal Uncle    Stroke Maternal Grandfather    Hypertension Maternal Grandfather    Lymphoma Paternal Grandmother 37   Breast cancer Other 27    Past medical history, surgical history, medications, allergies, family history and social history reviewed with patient today and changes made to appropriate areas of the chart.   Review of Systems  Constitutional: Negative.   HENT: Negative.    Eyes: Negative.   Respiratory: Negative.    Cardiovascular: Negative.   Gastrointestinal: Negative.   Genitourinary: Negative.   Musculoskeletal: Negative.   Skin: Negative.   Neurological: Negative.   Endo/Heme/Allergies: Negative.   Psychiatric/Behavioral: Negative.     All other ROS negative except what is listed above and in the HPI.      Objective:    BP 139/89 (BP Location: Right Arm, Cuff Size: Normal)   Pulse 86   Temp 98.2 F (36.8 C) (Oral)   Ht 5' 4 (1.626 m)   Wt 172 lb 6.4 oz (78.2 kg)   LMP 09/30/2018 (Approximate)   SpO2 97%   BMI 29.59 kg/m   Wt Readings from Last 3 Encounters:  04/20/24 172 lb 6.4 oz (78.2 kg)  03/25/24 170 lb 11.2 oz (77.4  kg)  02/09/24 170 lb 1.6 oz (77.2 kg)    Physical Exam Vitals and nursing note reviewed.  Constitutional:      General: She is not in acute distress.    Appearance: Normal appearance. She is not ill-appearing, toxic-appearing or diaphoretic.  HENT:     Head: Normocephalic and atraumatic.     Right Ear: Tympanic  membrane, ear canal and external ear normal. There is no impacted cerumen.     Left Ear: Tympanic membrane, ear canal and external ear normal. There is no impacted cerumen.     Nose: Nose normal. No congestion or rhinorrhea.     Mouth/Throat:     Mouth: Mucous membranes are moist.     Pharynx: Oropharynx is clear. No oropharyngeal exudate or posterior oropharyngeal erythema.  Eyes:     General: No scleral icterus.       Right eye: No discharge.        Left eye: No discharge.     Extraocular Movements: Extraocular movements intact.     Conjunctiva/sclera: Conjunctivae normal.     Pupils: Pupils are equal, round, and reactive to light.  Neck:     Vascular: No carotid bruit.  Cardiovascular:     Rate and Rhythm: Normal rate and regular rhythm.     Pulses: Normal pulses.     Heart sounds: No murmur heard.    No friction rub. No gallop.  Pulmonary:     Effort: Pulmonary effort is normal. No respiratory distress.     Breath sounds: Normal breath sounds. No stridor. No wheezing, rhonchi or rales.  Chest:     Chest wall: No tenderness.  Abdominal:     General: Abdomen is flat. Bowel sounds are normal. There is no distension.     Palpations: Abdomen is soft. There is no mass.     Tenderness: There is no abdominal tenderness. There is no right CVA tenderness, left CVA tenderness, guarding or rebound.     Hernia: No hernia is present.  Genitourinary:    Comments: Breast and pelvic exams deferred with shared decision making Musculoskeletal:        General: No swelling, tenderness, deformity or signs of injury.     Cervical back: Normal range of motion and neck supple. No rigidity. No muscular tenderness.     Right lower leg: No edema.     Left lower leg: No edema.  Lymphadenopathy:     Cervical: No cervical adenopathy.  Skin:    General: Skin is warm and dry.     Capillary Refill: Capillary refill takes less than 2 seconds.     Coloration: Skin is not jaundiced or pale.     Findings:  No bruising, erythema, lesion or rash.  Neurological:     General: No focal deficit present.     Mental Status: She is alert and oriented to person, place, and time. Mental status is at baseline.     Cranial Nerves: No cranial nerve deficit.     Sensory: No sensory deficit.     Motor: No weakness.     Coordination: Coordination normal.     Gait: Gait normal.     Deep Tendon Reflexes: Reflexes normal.  Psychiatric:        Mood and Affect: Mood normal.        Behavior: Behavior normal.        Thought Content: Thought content normal.        Judgment: Judgment normal.     Results  for orders placed or performed in visit on 10/17/23  CBC with Differential/Platelet   Collection Time: 10/17/23  8:49 AM  Result Value Ref Range   WBC 5.1 3.4 - 10.8 x10E3/uL   RBC 4.52 3.77 - 5.28 x10E6/uL   Hemoglobin 13.8 11.1 - 15.9 g/dL   Hematocrit 58.5 65.9 - 46.6 %   MCV 92 79 - 97 fL   MCH 30.5 26.6 - 33.0 pg   MCHC 33.3 31.5 - 35.7 g/dL   RDW 87.2 88.2 - 84.5 %   Platelets 229 150 - 450 x10E3/uL   Neutrophils 68 Not Estab. %   Lymphs 24 Not Estab. %   Monocytes 6 Not Estab. %   Eos 1 Not Estab. %   Basos 1 Not Estab. %   Neutrophils Absolute 3.4 1.4 - 7.0 x10E3/uL   Lymphocytes Absolute 1.2 0.7 - 3.1 x10E3/uL   Monocytes Absolute 0.3 0.1 - 0.9 x10E3/uL   EOS (ABSOLUTE) 0.1 0.0 - 0.4 x10E3/uL   Basophils Absolute 0.0 0.0 - 0.2 x10E3/uL   Immature Granulocytes 0 Not Estab. %   Immature Grans (Abs) 0.0 0.0 - 0.1 x10E3/uL  Lipid Panel w/o Chol/HDL Ratio   Collection Time: 10/17/23  8:49 AM  Result Value Ref Range   Cholesterol, Total 209 (H) 100 - 199 mg/dL   Triglycerides 95 0 - 149 mg/dL   HDL 59 >60 mg/dL   VLDL Cholesterol Cal 17 5 - 40 mg/dL   LDL Chol Calc (NIH) 866 (H) 0 - 99 mg/dL  Comprehensive metabolic panel with GFR   Collection Time: 10/17/23  8:49 AM  Result Value Ref Range   Glucose 124 (H) 70 - 99 mg/dL   BUN 16 8 - 27 mg/dL   Creatinine, Ser 9.36 0.57 - 1.00 mg/dL    eGFR 898 >40 fO/fpw/8.26   BUN/Creatinine Ratio 25 12 - 28   Sodium 142 134 - 144 mmol/L   Potassium 4.2 3.5 - 5.2 mmol/L   Chloride 102 96 - 106 mmol/L   CO2 27 20 - 29 mmol/L   Calcium 9.4 8.7 - 10.3 mg/dL   Total Protein 6.6 6.0 - 8.5 g/dL   Albumin 4.5 3.8 - 4.9 g/dL   Globulin, Total 2.1 1.5 - 4.5 g/dL   Bilirubin Total 0.3 0.0 - 1.2 mg/dL   Alkaline Phosphatase 120 44 - 121 IU/L   AST 20 0 - 40 IU/L   ALT 14 0 - 32 IU/L  TSH   Collection Time: 10/17/23  8:49 AM  Result Value Ref Range   TSH 0.960 0.450 - 4.500 uIU/mL      Assessment & Plan:   Problem List Items Addressed This Visit       Cardiovascular and Mediastinum   Hypertension   Under good control on current regimen. Continue current regimen. Continue to monitor. Call with any concerns. Refills given. Labs drawn today.        Relevant Medications   losartan  (COZAAR ) 25 MG tablet   Other Relevant Orders   CBC with Differential/Platelet   Comprehensive metabolic panel with GFR   Microalbumin, Urine Waived     Endocrine   Hypothyroidism (Chronic)   Rechecking labs today. Await results. Treat as needed.       Relevant Orders   CBC with Differential/Platelet   Comprehensive metabolic panel with GFR   TSH     Other   Anxiety   Under good control on current regimen. Continue current regimen. Continue to monitor. Call with any  concerns. Refills given. Labs drawn today.       Relevant Medications   escitalopram  (LEXAPRO ) 20 MG tablet   Other Relevant Orders   CBC with Differential/Platelet   Comprehensive metabolic panel with GFR   Mixed hyperlipidemia   Rechecking labs today. Await results. Treat as needed.       Relevant Medications   losartan  (COZAAR ) 25 MG tablet   Other Relevant Orders   CBC with Differential/Platelet   Comprehensive metabolic panel with GFR   Lipid Panel w/o Chol/HDL Ratio   Other Visit Diagnoses       Routine general medical examination at a health care facility    -   Primary   Vaccines up to date. Screening labs checked today. Mammo and colonoscopy ordered. Pap up to date. Continue diet and exercise. Call with any concerns.   Relevant Orders   CBC with Differential/Platelet   Comprehensive metabolic panel with GFR   Lipid Panel w/o Chol/HDL Ratio   TSH   Microalbumin, Urine Waived     Encounter for screening mammogram for malignant neoplasm of breast       Mammogram ordered today.   Relevant Orders   MM 3D DIAGNOSTIC MAMMOGRAM BILATERAL BREAST W/IMPLANT     Screening for colon cancer       Colonoscopy ordered today.   Relevant Orders   Ambulatory referral to Gastroenterology        Follow up plan: Return in about 6 months (around 10/18/2024).   LABORATORY TESTING:  - Pap smear: up to date  IMMUNIZATIONS:   - Tdap: Tetanus vaccination status reviewed: last tetanus booster within 10 years. - Influenza: Up to date - Prevnar: Refused - COVID: Refused - HPV: Not applicable - Shingrix  vaccine: Up to date  SCREENING: -Mammogram: Ordered today  - Colonoscopy: Ordered today   PATIENT COUNSELING:   Advised to take 1 mg of folate supplement per day if capable of pregnancy.   Sexuality: Discussed sexually transmitted diseases, partner selection, use of condoms, avoidance of unintended pregnancy  and contraceptive alternatives.   Advised to avoid cigarette smoking.  I discussed with the patient that most people either abstain from alcohol or drink within safe limits (<=14/week and <=4 drinks/occasion for males, <=7/weeks and <= 3 drinks/occasion for females) and that the risk for alcohol disorders and other health effects rises proportionally with the number of drinks per week and how often a drinker exceeds daily limits.  Discussed cessation/primary prevention of drug use and availability of treatment for abuse.   Diet: Encouraged to adjust caloric intake to maintain  or achieve ideal body weight, to reduce intake of dietary saturated fat and  total fat, to limit sodium intake by avoiding high sodium foods and not adding table salt, and to maintain adequate dietary potassium and calcium preferably from fresh fruits, vegetables, and low-fat dairy products.    stressed the importance of regular exercise  Injury prevention: Discussed safety belts, safety helmets, smoke detector, smoking near bedding or upholstery.   Dental health: Discussed importance of regular tooth brushing, flossing, and dental visits.    NEXT PREVENTATIVE PHYSICAL DUE IN 1 YEAR. Return in about 6 months (around 10/18/2024).

## 2024-04-21 LAB — LIPID PANEL W/O CHOL/HDL RATIO
Cholesterol, Total: 247 mg/dL — ABNORMAL HIGH (ref 100–199)
HDL: 63 mg/dL (ref >39)
LDL Chol Calc (NIH): 158 mg/dL — ABNORMAL HIGH (ref 0–99)
Triglycerides: 148 mg/dL (ref 0–149)
VLDL Cholesterol Cal: 26 mg/dL (ref 5–40)

## 2024-04-21 LAB — COMPREHENSIVE METABOLIC PANEL WITH GFR
ALT: 51 IU/L — ABNORMAL HIGH (ref 0–32)
AST: 44 IU/L — ABNORMAL HIGH (ref 0–40)
Albumin: 4.7 g/dL (ref 3.8–4.9)
Alkaline Phosphatase: 114 IU/L (ref 49–135)
BUN/Creatinine Ratio: 27 (ref 12–28)
BUN: 15 mg/dL (ref 8–27)
Bilirubin Total: 0.5 mg/dL (ref 0.0–1.2)
CO2: 25 mmol/L (ref 20–29)
Calcium: 10.2 mg/dL (ref 8.7–10.3)
Chloride: 99 mmol/L (ref 96–106)
Creatinine, Ser: 0.56 mg/dL — ABNORMAL LOW (ref 0.57–1.00)
Globulin, Total: 2.2 g/dL (ref 1.5–4.5)
Glucose: 90 mg/dL (ref 70–99)
Potassium: 4.6 mmol/L (ref 3.5–5.2)
Sodium: 140 mmol/L (ref 134–144)
Total Protein: 6.9 g/dL (ref 6.0–8.5)
eGFR: 104 mL/min/1.73 (ref >59)

## 2024-04-21 LAB — CBC WITH DIFFERENTIAL/PLATELET
Basophils Absolute: 0 x10E3/uL (ref 0.0–0.2)
Basos: 0 %
EOS (ABSOLUTE): 0.1 x10E3/uL (ref 0.0–0.4)
Eos: 2 %
Hematocrit: 43.6 % (ref 34.0–46.6)
Hemoglobin: 14 g/dL (ref 11.1–15.9)
Immature Grans (Abs): 0 x10E3/uL (ref 0.0–0.1)
Immature Granulocytes: 0 %
Lymphocytes Absolute: 1.7 x10E3/uL (ref 0.7–3.1)
Lymphs: 27 %
MCH: 30.1 pg (ref 26.6–33.0)
MCHC: 32.1 g/dL (ref 31.5–35.7)
MCV: 94 fL (ref 79–97)
Monocytes Absolute: 0.5 x10E3/uL (ref 0.1–0.9)
Monocytes: 7 %
Neutrophils Absolute: 4 x10E3/uL (ref 1.4–7.0)
Neutrophils: 63 %
Platelets: 235 x10E3/uL (ref 150–450)
RBC: 4.65 x10E6/uL (ref 3.77–5.28)
RDW: 13.2 % (ref 11.7–15.4)
WBC: 6.4 x10E3/uL (ref 3.4–10.8)

## 2024-04-21 LAB — TSH: TSH: 1.58 u[IU]/mL (ref 0.450–4.500)

## 2024-04-22 ENCOUNTER — Ambulatory Visit: Payer: Self-pay | Admitting: Family Medicine

## 2024-04-22 DIAGNOSIS — R748 Abnormal levels of other serum enzymes: Secondary | ICD-10-CM

## 2024-04-22 DIAGNOSIS — E782 Mixed hyperlipidemia: Secondary | ICD-10-CM

## 2024-04-22 MED ORDER — LEVOTHYROXINE SODIUM 25 MCG PO TABS: 25.0000 ug | ORAL_TABLET | Freq: Every day | ORAL | 3 refills | Status: AC

## 2024-04-22 NOTE — Progress Notes (Signed)
 Scheduled

## 2024-04-23 ENCOUNTER — Encounter: Payer: Self-pay | Admitting: Oncology

## 2024-04-23 ENCOUNTER — Encounter: Payer: Self-pay | Admitting: Family Medicine

## 2024-04-27 ENCOUNTER — Telehealth: Payer: Self-pay

## 2024-04-27 ENCOUNTER — Other Ambulatory Visit: Payer: Self-pay

## 2024-04-27 DIAGNOSIS — Z800 Family history of malignant neoplasm of digestive organs: Secondary | ICD-10-CM

## 2024-04-27 DIAGNOSIS — Z860100 Personal history of colon polyps, unspecified: Secondary | ICD-10-CM

## 2024-04-27 MED ORDER — NA SULFATE-K SULFATE-MG SULF 17.5-3.13-1.6 GM/177ML PO SOLN: 1.0000 | Freq: Once | ORAL | 0 refills | Status: AC

## 2024-04-27 NOTE — Telephone Encounter (Signed)
 Gastroenterology Pre-Procedure Review  Request Date: 07/07/24 Requesting Physician: Dr. Melany  PATIENT REVIEW QUESTIONS: The patient responded to the following health history questions as indicated:    1. Are you having any GI issues? no 2. Do you have a personal history of Polyps? yes (01/25/2019 recommended repeat in 5 years) 3. Do you have a family history of Colon Cancer or Polyps? yes (father colon cancer) 4. Diabetes Mellitus? no 5. Joint replacements in the past 12 months?no 6. Major health problems in the past 3 months?no 7. Any artificial heart valves, MVP, or defibrillator?no    MEDICATIONS & ALLERGIES:    Patient reports the following regarding taking any anticoagulation/antiplatelet therapy:   Plavix, Coumadin, Eliquis, Xarelto, Lovenox, Pradaxa, Brilinta, or Effient? no Aspirin? no  Patient confirms/reports the following medications:  Current Outpatient Medications  Medication Sig Dispense Refill   Na Sulfate-K Sulfate-Mg Sulfate concentrate (SUPREP) 17.5-3.13-1.6 GM/177ML SOLN Take 1 kit (354 mLs total) by mouth once for 1 dose. 354 mL 0   b complex vitamins capsule Take 1 capsule by mouth daily.     Cholecalciferol (VITAMIN D ) 2000 UNITS CAPS Take 2,000 Units by mouth daily.     escitalopram  (LEXAPRO ) 20 MG tablet Take 1 tablet (20 mg total) by mouth daily. 90 tablet 1   fluticasone (FLONASE) 50 MCG/ACT nasal spray Place 1 spray into both nostrils 2 (two) times daily.      letrozole  (FEMARA ) 2.5 MG tablet TAKE 1 TABLET BY MOUTH EVERY DAY 90 tablet 1   levothyroxine  (SYNTHROID ) 25 MCG tablet Take 1 tablet (25 mcg total) by mouth daily before breakfast. 90 tablet 3   loratadine (CLARITIN) 10 MG tablet Take 10 mg by mouth daily.     losartan  (COZAAR ) 25 MG tablet Take 1 tablet (25 mg total) by mouth daily. 90 tablet 1   Multiple Vitamin (MULTIVITAMIN) capsule Take 1 capsule by mouth daily.       Omega-3 Fatty Acids (FISH OIL PO) Take 2 capsules by mouth daily.     No  current facility-administered medications for this visit.    Patient confirms/reports the following allergies:  Allergies  Allergen Reactions   Amoxicillin Rash   Omnipaque [Iohexol] Shortness Of Breath   Phenytoin Sodium Extended Swelling   Depakote [Valproic Acid] Swelling    Hair and falls out    Iodinated Contrast Media Itching   Trileptal [Oxcarbazepine] Swelling    No orders of the defined types were placed in this encounter.   AUTHORIZATION INFORMATION Primary Insurance: 1D#: Group #:  Secondary Insurance: 1D#: Group #:  SCHEDULE INFORMATION: Date: 07/07/24 Time: Location: [facility]

## 2024-05-19 ENCOUNTER — Inpatient Hospital Stay: Admission: RE | Admit: 2024-05-19 | Discharge: 2024-05-19 | Attending: Family Medicine

## 2024-05-19 ENCOUNTER — Ambulatory Visit
Admission: RE | Admit: 2024-05-19 | Discharge: 2024-05-19 | Disposition: A | Discharge status: Home / Self Care | Source: Ambulatory Visit | Attending: Family Medicine | Admitting: Family Medicine

## 2024-05-19 DIAGNOSIS — R928 Other abnormal and inconclusive findings on diagnostic imaging of breast: Secondary | ICD-10-CM

## 2024-05-19 DIAGNOSIS — Z1231 Encounter for screening mammogram for malignant neoplasm of breast: Secondary | ICD-10-CM | POA: Diagnosis not present

## 2024-05-19 DIAGNOSIS — D0511 Intraductal carcinoma in situ of right breast: Secondary | ICD-10-CM

## 2024-05-19 DIAGNOSIS — R92323 Mammographic fibroglandular density, bilateral breasts: Secondary | ICD-10-CM | POA: Diagnosis not present

## 2024-05-19 DIAGNOSIS — D0510 Intraductal carcinoma in situ of unspecified breast: Secondary | ICD-10-CM | POA: Diagnosis not present

## 2024-05-19 HISTORY — DX: Personal history of irradiation: Z92.3

## 2024-05-20 ENCOUNTER — Ambulatory Visit: Payer: Self-pay | Admitting: Family Medicine

## 2024-05-24 ENCOUNTER — Other Ambulatory Visit

## 2024-05-24 DIAGNOSIS — R748 Abnormal levels of other serum enzymes: Secondary | ICD-10-CM

## 2024-05-24 DIAGNOSIS — E782 Mixed hyperlipidemia: Secondary | ICD-10-CM | POA: Diagnosis not present

## 2024-05-25 LAB — COMPREHENSIVE METABOLIC PANEL WITH GFR
ALT: 22 IU/L (ref 0–32)
AST: 27 IU/L (ref 0–40)
Albumin: 4.6 g/dL (ref 3.8–4.9)
Alkaline Phosphatase: 113 IU/L (ref 49–135)
BUN/Creatinine Ratio: 21 (ref 12–28)
BUN: 13 mg/dL (ref 8–27)
Bilirubin Total: 0.4 mg/dL (ref 0.0–1.2)
CO2: 27 mmol/L (ref 20–29)
Calcium: 9.9 mg/dL (ref 8.7–10.3)
Chloride: 100 mmol/L (ref 96–106)
Creatinine, Ser: 0.61 mg/dL (ref 0.57–1.00)
Globulin, Total: 2 g/dL (ref 1.5–4.5)
Glucose: 88 mg/dL (ref 70–99)
Potassium: 4.4 mmol/L (ref 3.5–5.2)
Sodium: 139 mmol/L (ref 134–144)
Total Protein: 6.6 g/dL (ref 6.0–8.5)
eGFR: 102 mL/min/1.73 (ref >59)

## 2024-05-25 LAB — LIPID PANEL W/O CHOL/HDL RATIO
Cholesterol, Total: 238 mg/dL — ABNORMAL HIGH (ref 100–199)
HDL: 62 mg/dL (ref >39)
LDL Chol Calc (NIH): 153 mg/dL — ABNORMAL HIGH (ref 0–99)
Triglycerides: 128 mg/dL (ref 0–149)
VLDL Cholesterol Cal: 23 mg/dL (ref 5–40)

## 2024-05-27 ENCOUNTER — Ambulatory Visit: Payer: Self-pay | Admitting: Family Medicine

## 2024-05-27 DIAGNOSIS — D0511 Intraductal carcinoma in situ of right breast: Secondary | ICD-10-CM | POA: Diagnosis not present

## 2024-06-30 ENCOUNTER — Other Ambulatory Visit: Payer: Self-pay

## 2024-06-30 ENCOUNTER — Encounter: Payer: Self-pay | Admitting: Gastroenterology

## 2024-07-01 ENCOUNTER — Encounter: Payer: Self-pay | Admitting: Anesthesiology

## 2024-07-07 ENCOUNTER — Encounter: Admission: RE | Payer: Self-pay | Source: Home / Self Care

## 2024-07-07 ENCOUNTER — Ambulatory Visit: Admission: RE | Admit: 2024-07-07 | Source: Home / Self Care | Admitting: Gastroenterology

## 2024-07-07 SURGERY — COLONOSCOPY
Anesthesia: Choice

## 2024-07-12 ENCOUNTER — Encounter: Payer: Self-pay | Admitting: Family Medicine

## 2024-07-12 DIAGNOSIS — Z1211 Encounter for screening for malignant neoplasm of colon: Secondary | ICD-10-CM

## 2024-08-09 ENCOUNTER — Ambulatory Visit: Admitting: Oncology

## 2024-09-30 ENCOUNTER — Ambulatory Visit: Admitting: Radiation Oncology

## 2024-10-20 ENCOUNTER — Ambulatory Visit: Admitting: Family Medicine
